# Patient Record
Sex: Female | Born: 1950 | Race: White | Hispanic: No
Health system: Midwestern US, Community
[De-identification: ages and names within clinical notes are randomized; demographics above are authoritative.]

## PROBLEM LIST (undated history)

## (undated) DIAGNOSIS — E785 Hyperlipidemia, unspecified: Secondary | ICD-10-CM

## (undated) DIAGNOSIS — I10 Essential (primary) hypertension: Secondary | ICD-10-CM

## (undated) DIAGNOSIS — J302 Other seasonal allergic rhinitis: Secondary | ICD-10-CM

## (undated) DIAGNOSIS — R7303 Prediabetes: Secondary | ICD-10-CM

## (undated) DIAGNOSIS — K219 Gastro-esophageal reflux disease without esophagitis: Secondary | ICD-10-CM

## (undated) DIAGNOSIS — N8111 Cystocele, midline: Secondary | ICD-10-CM

## (undated) DIAGNOSIS — Z419 Encounter for procedure for purposes other than remedying health state, unspecified: Secondary | ICD-10-CM

## (undated) DIAGNOSIS — N993 Prolapse of vaginal vault after hysterectomy: Secondary | ICD-10-CM

## (undated) DIAGNOSIS — K5520 Angiodysplasia of colon without hemorrhage: Secondary | ICD-10-CM

## (undated) DIAGNOSIS — E559 Vitamin D deficiency, unspecified: Secondary | ICD-10-CM

## (undated) DIAGNOSIS — Z79899 Other long term (current) drug therapy: Secondary | ICD-10-CM

## (undated) DIAGNOSIS — G5702 Lesion of sciatic nerve, left lower limb: Secondary | ICD-10-CM

## (undated) DIAGNOSIS — I7300 Raynaud's syndrome without gangrene: Secondary | ICD-10-CM

## (undated) DIAGNOSIS — K635 Polyp of colon: Secondary | ICD-10-CM

## (undated) DIAGNOSIS — Z952 Presence of prosthetic heart valve: Secondary | ICD-10-CM

## (undated) DIAGNOSIS — C439 Malignant melanoma of skin, unspecified: Secondary | ICD-10-CM

## (undated) HISTORY — PX: CARDIAC VALVE REPLACEMENT: SHX585

## (undated) HISTORY — DX: Raynaud's syndrome without gangrene: I73.00

## (undated) HISTORY — PX: BREAST EXCISIONAL BIOPSY: SUR124

## (undated) HISTORY — DX: Polyp of colon: K63.5

## (undated) HISTORY — PX: ABDOMINAL HYSTERECTOMY: SHX81

## (undated) HISTORY — DX: Malignant melanoma of skin, unspecified: C43.9

## (undated) HISTORY — DX: Presence of prosthetic heart valve: Z95.2

## (undated) HISTORY — PX: EYE SURGERY: SHX253

---

## 1997-06-22 ENCOUNTER — Ambulatory Visit (HOSPITAL_COMMUNITY): Admission: RE | Admit: 1997-06-22 | Discharge: 1997-06-22 | Payer: Self-pay | Admitting: Obstetrics and Gynecology

## 1998-06-19 ENCOUNTER — Ambulatory Visit (HOSPITAL_COMMUNITY): Admission: RE | Admit: 1998-06-19 | Discharge: 1998-06-19 | Payer: Self-pay | Admitting: Obstetrics and Gynecology

## 1999-04-12 ENCOUNTER — Ambulatory Visit (HOSPITAL_COMMUNITY): Admission: RE | Admit: 1999-04-12 | Discharge: 1999-04-12 | Payer: Self-pay | Admitting: Obstetrics and Gynecology

## 1999-04-12 ENCOUNTER — Encounter: Payer: Self-pay | Admitting: Obstetrics and Gynecology

## 1999-07-12 ENCOUNTER — Encounter: Admission: RE | Admit: 1999-07-12 | Discharge: 1999-07-12 | Payer: Self-pay | Admitting: Obstetrics and Gynecology

## 1999-07-12 ENCOUNTER — Encounter: Payer: Self-pay | Admitting: Obstetrics and Gynecology

## 2000-01-01 ENCOUNTER — Ambulatory Visit (HOSPITAL_BASED_OUTPATIENT_CLINIC_OR_DEPARTMENT_OTHER): Admission: RE | Admit: 2000-01-01 | Discharge: 2000-01-01 | Payer: Self-pay | Admitting: Plastic Surgery

## 2000-01-01 ENCOUNTER — Encounter (INDEPENDENT_AMBULATORY_CARE_PROVIDER_SITE_OTHER): Payer: Self-pay | Admitting: Specialist

## 2000-05-07 ENCOUNTER — Emergency Department (HOSPITAL_COMMUNITY): Admission: EM | Admit: 2000-05-07 | Discharge: 2000-05-08 | Payer: Self-pay | Admitting: Emergency Medicine

## 2000-05-07 ENCOUNTER — Encounter: Payer: Self-pay | Admitting: Emergency Medicine

## 2000-05-08 ENCOUNTER — Encounter: Payer: Self-pay | Admitting: Emergency Medicine

## 2000-07-14 ENCOUNTER — Ambulatory Visit (HOSPITAL_COMMUNITY): Admission: RE | Admit: 2000-07-14 | Discharge: 2000-07-14 | Payer: Self-pay | Admitting: Obstetrics and Gynecology

## 2000-07-14 ENCOUNTER — Encounter: Payer: Self-pay | Admitting: Obstetrics and Gynecology

## 2000-07-17 ENCOUNTER — Ambulatory Visit (HOSPITAL_BASED_OUTPATIENT_CLINIC_OR_DEPARTMENT_OTHER): Admission: RE | Admit: 2000-07-17 | Discharge: 2000-07-17 | Payer: Self-pay | Admitting: Plastic Surgery

## 2000-07-17 ENCOUNTER — Encounter (INDEPENDENT_AMBULATORY_CARE_PROVIDER_SITE_OTHER): Payer: Self-pay | Admitting: *Deleted

## 2001-07-16 ENCOUNTER — Ambulatory Visit (HOSPITAL_COMMUNITY): Admission: RE | Admit: 2001-07-16 | Discharge: 2001-07-16 | Payer: Self-pay | Admitting: Obstetrics and Gynecology

## 2001-07-16 ENCOUNTER — Encounter: Payer: Self-pay | Admitting: Obstetrics and Gynecology

## 2002-07-21 ENCOUNTER — Encounter: Payer: Self-pay | Admitting: Obstetrics and Gynecology

## 2002-07-21 ENCOUNTER — Ambulatory Visit (HOSPITAL_COMMUNITY): Admission: RE | Admit: 2002-07-21 | Discharge: 2002-07-21 | Payer: Self-pay | Admitting: Obstetrics and Gynecology

## 2002-12-31 ENCOUNTER — Encounter: Admission: RE | Admit: 2002-12-31 | Discharge: 2002-12-31 | Payer: Self-pay | Admitting: Obstetrics and Gynecology

## 2003-07-29 ENCOUNTER — Ambulatory Visit (HOSPITAL_COMMUNITY): Admission: RE | Admit: 2003-07-29 | Discharge: 2003-07-29 | Payer: Self-pay | Admitting: Obstetrics and Gynecology

## 2004-08-03 ENCOUNTER — Ambulatory Visit (HOSPITAL_COMMUNITY): Admission: RE | Admit: 2004-08-03 | Discharge: 2004-08-03 | Payer: Self-pay | Admitting: Obstetrics and Gynecology

## 2005-08-06 ENCOUNTER — Ambulatory Visit (HOSPITAL_COMMUNITY): Admission: RE | Admit: 2005-08-06 | Discharge: 2005-08-06 | Payer: Self-pay | Admitting: Obstetrics and Gynecology

## 2006-01-28 HISTORY — PX: BILATERAL OOPHORECTOMY: SHX1221

## 2006-08-08 ENCOUNTER — Ambulatory Visit (HOSPITAL_COMMUNITY): Admission: RE | Admit: 2006-08-08 | Discharge: 2006-08-08 | Payer: Self-pay | Admitting: Obstetrics and Gynecology

## 2006-08-11 ENCOUNTER — Encounter: Admission: RE | Admit: 2006-08-11 | Discharge: 2006-08-11 | Payer: Self-pay | Admitting: Obstetrics and Gynecology

## 2006-08-18 ENCOUNTER — Ambulatory Visit (HOSPITAL_COMMUNITY): Admission: RE | Admit: 2006-08-18 | Discharge: 2006-08-18 | Payer: Self-pay | Admitting: Obstetrics and Gynecology

## 2007-01-29 DIAGNOSIS — K635 Polyp of colon: Secondary | ICD-10-CM

## 2007-01-29 HISTORY — DX: Polyp of colon: K63.5

## 2007-08-11 ENCOUNTER — Ambulatory Visit (HOSPITAL_COMMUNITY): Admission: RE | Admit: 2007-08-11 | Discharge: 2007-08-11 | Payer: Self-pay | Admitting: Obstetrics and Gynecology

## 2008-08-16 ENCOUNTER — Ambulatory Visit (HOSPITAL_COMMUNITY): Admission: RE | Admit: 2008-08-16 | Discharge: 2008-08-16 | Payer: Self-pay | Admitting: Obstetrics and Gynecology

## 2009-06-02 ENCOUNTER — Inpatient Hospital Stay (HOSPITAL_BASED_OUTPATIENT_CLINIC_OR_DEPARTMENT_OTHER): Admission: RE | Admit: 2009-06-02 | Discharge: 2009-06-02 | Payer: Self-pay | Admitting: Interventional Cardiology

## 2009-06-06 ENCOUNTER — Ambulatory Visit: Payer: Self-pay | Admitting: Surgery

## 2009-06-13 ENCOUNTER — Encounter: Admission: RE | Admit: 2009-06-13 | Discharge: 2009-06-13 | Payer: Self-pay | Admitting: Surgery

## 2009-06-13 ENCOUNTER — Ambulatory Visit: Payer: Self-pay | Admitting: Surgery

## 2009-07-03 ENCOUNTER — Encounter: Payer: Self-pay | Admitting: Surgery

## 2009-07-05 ENCOUNTER — Encounter: Payer: Self-pay | Admitting: Surgery

## 2009-07-05 ENCOUNTER — Ambulatory Visit: Payer: Self-pay | Admitting: Surgery

## 2009-07-05 ENCOUNTER — Inpatient Hospital Stay (HOSPITAL_COMMUNITY): Admission: RE | Admit: 2009-07-05 | Discharge: 2009-07-11 | Payer: Self-pay | Admitting: Surgery

## 2009-07-25 ENCOUNTER — Encounter: Admission: RE | Admit: 2009-07-25 | Discharge: 2009-07-25 | Payer: Self-pay | Admitting: Surgery

## 2009-07-25 ENCOUNTER — Ambulatory Visit: Payer: Self-pay | Admitting: Surgery

## 2009-08-03 ENCOUNTER — Encounter (HOSPITAL_COMMUNITY): Admission: RE | Admit: 2009-08-03 | Discharge: 2009-10-27 | Payer: Self-pay | Admitting: Interventional Cardiology

## 2009-10-28 ENCOUNTER — Encounter (HOSPITAL_COMMUNITY)
Admission: RE | Admit: 2009-10-28 | Discharge: 2009-12-27 | Payer: Self-pay | Source: Home / Self Care | Admitting: Interventional Cardiology

## 2010-01-18 ENCOUNTER — Ambulatory Visit (HOSPITAL_COMMUNITY)
Admission: RE | Admit: 2010-01-18 | Discharge: 2010-01-18 | Payer: Self-pay | Source: Home / Self Care | Attending: Obstetrics and Gynecology | Admitting: Obstetrics and Gynecology

## 2010-04-16 LAB — TYPE AND SCREEN
ABO/RH(D): O POS
Antibody Screen: NEGATIVE

## 2010-04-16 LAB — PROTIME-INR
INR: 1.43 (ref 0.00–1.49)
Prothrombin Time: 17.3 seconds — ABNORMAL HIGH (ref 11.6–15.2)
Prothrombin Time: 20.4 seconds — ABNORMAL HIGH (ref 11.6–15.2)

## 2010-04-16 LAB — CBC
HCT: 23.6 % — ABNORMAL LOW (ref 36.0–46.0)
HCT: 27.1 % — ABNORMAL LOW (ref 36.0–46.0)
HCT: 28.4 % — ABNORMAL LOW (ref 36.0–46.0)
Hemoglobin: 15.2 g/dL — ABNORMAL HIGH (ref 12.0–15.0)
Hemoglobin: 9.4 g/dL — ABNORMAL LOW (ref 12.0–15.0)
MCHC: 34.4 g/dL (ref 30.0–36.0)
MCHC: 34.4 g/dL (ref 30.0–36.0)
MCHC: 35 g/dL (ref 30.0–36.0)
MCV: 96.6 fL (ref 78.0–100.0)
MCV: 97.8 fL (ref 78.0–100.0)
MCV: 97.8 fL (ref 78.0–100.0)
MCV: 98.3 fL (ref 78.0–100.0)
MCV: 98.3 fL (ref 78.0–100.0)
Platelets: 103 10*3/uL — ABNORMAL LOW (ref 150–400)
Platelets: 110 10*3/uL — ABNORMAL LOW (ref 150–400)
Platelets: 116 10*3/uL — ABNORMAL LOW (ref 150–400)
Platelets: 128 10*3/uL — ABNORMAL LOW (ref 150–400)
Platelets: 129 10*3/uL — ABNORMAL LOW (ref 150–400)
Platelets: 136 10*3/uL — ABNORMAL LOW (ref 150–400)
RBC: 2.36 MIL/uL — ABNORMAL LOW (ref 3.87–5.11)
RBC: 2.7 MIL/uL — ABNORMAL LOW (ref 3.87–5.11)
RBC: 2.77 MIL/uL — ABNORMAL LOW (ref 3.87–5.11)
RBC: 4.5 MIL/uL (ref 3.87–5.11)
RDW: 12.7 % (ref 11.5–15.5)
RDW: 12.9 % (ref 11.5–15.5)
RDW: 13.1 % (ref 11.5–15.5)
WBC: 10.4 10*3/uL (ref 4.0–10.5)
WBC: 11.2 10*3/uL — ABNORMAL HIGH (ref 4.0–10.5)
WBC: 5.6 10*3/uL (ref 4.0–10.5)
WBC: 5.9 10*3/uL (ref 4.0–10.5)
WBC: 8.4 10*3/uL (ref 4.0–10.5)

## 2010-04-16 LAB — PREPARE FRESH FROZEN PLASMA

## 2010-04-16 LAB — POCT I-STAT 3, ART BLOOD GAS (G3+)
Bicarbonate: 26.5 mEq/L — ABNORMAL HIGH (ref 20.0–24.0)
O2 Saturation: 100 %
Patient temperature: 36.9
TCO2: 27 mmol/L (ref 0–100)
pCO2 arterial: 36.2 mmHg (ref 35.0–45.0)
pCO2 arterial: 39 mmHg (ref 35.0–45.0)
pH, Arterial: 7.43 — ABNORMAL HIGH (ref 7.350–7.400)
pH, Arterial: 7.456 — ABNORMAL HIGH (ref 7.350–7.400)
pO2, Arterial: 158 mmHg — ABNORMAL HIGH (ref 80.0–100.0)
pO2, Arterial: 175 mmHg — ABNORMAL HIGH (ref 80.0–100.0)

## 2010-04-16 LAB — CREATININE, SERUM
Creatinine, Ser: 0.54 mg/dL (ref 0.4–1.2)
GFR calc Af Amer: >60 mL/min (ref >60)
GFR calc non Af Amer: >60 mL/min (ref >60)

## 2010-04-16 LAB — BASIC METABOLIC PANEL
BUN: 14 mg/dL (ref 6–23)
BUN: 15 mg/dL (ref 6–23)
BUN: 18 mg/dL (ref 6–23)
BUN: 8 mg/dL (ref 6–23)
CO2: 24 mEq/L (ref 19–32)
CO2: 24 mEq/L (ref 19–32)
CO2: 27 mEq/L (ref 19–32)
CO2: 28 mEq/L (ref 19–32)
Calcium: 8.8 mg/dL (ref 8.4–10.5)
Chloride: 105 mEq/L (ref 96–112)
Chloride: 105 mEq/L (ref 96–112)
Chloride: 106 mEq/L (ref 96–112)
Chloride: 108 mEq/L (ref 96–112)
Chloride: 112 mEq/L (ref 96–112)
Creatinine, Ser: 0.56 mg/dL (ref 0.4–1.2)
Creatinine, Ser: 0.62 mg/dL (ref 0.4–1.2)
Creatinine, Ser: 0.7 mg/dL (ref 0.4–1.2)
Creatinine, Ser: 0.73 mg/dL (ref 0.4–1.2)
GFR calc Af Amer: >60 mL/min (ref >60)
Glucose, Bld: 105 mg/dL — ABNORMAL HIGH (ref 70–99)
Glucose, Bld: 105 mg/dL — ABNORMAL HIGH (ref 70–99)
Glucose, Bld: 125 mg/dL — ABNORMAL HIGH (ref 70–99)
Potassium: 5.5 mEq/L — ABNORMAL HIGH (ref 3.5–5.1)
Sodium: 135 mEq/L (ref 135–145)
Sodium: 137 mEq/L (ref 135–145)

## 2010-04-16 LAB — POCT I-STAT 4, (NA,K, GLUC, HGB,HCT)
Glucose, Bld: 109 mg/dL — ABNORMAL HIGH (ref 70–99)
Glucose, Bld: 98 mg/dL (ref 70–99)
HCT: 25 % — ABNORMAL LOW (ref 36.0–46.0)
HCT: 25 % — ABNORMAL LOW (ref 36.0–46.0)
HCT: 26 % — ABNORMAL LOW (ref 36.0–46.0)
HCT: 36 % (ref 36.0–46.0)
Hemoglobin: 8.5 g/dL — ABNORMAL LOW (ref 12.0–15.0)
Hemoglobin: 9.5 g/dL — ABNORMAL LOW (ref 12.0–15.0)
Potassium: 3.2 mEq/L — ABNORMAL LOW (ref 3.5–5.1)
Potassium: 4.5 mEq/L (ref 3.5–5.1)
Sodium: 135 mEq/L (ref 135–145)
Sodium: 136 mEq/L (ref 135–145)
Sodium: 138 mEq/L (ref 135–145)

## 2010-04-16 LAB — GLUCOSE, CAPILLARY
Glucose-Capillary: 126 mg/dL — ABNORMAL HIGH (ref 70–99)
Glucose-Capillary: 141 mg/dL — ABNORMAL HIGH (ref 70–99)
Glucose-Capillary: 96 mg/dL (ref 70–99)

## 2010-04-16 LAB — SURGICAL PCR SCREEN
MRSA, PCR: NEGATIVE
Staphylococcus aureus: NEGATIVE

## 2010-04-16 LAB — HEMOGLOBIN AND HEMATOCRIT, BLOOD
HCT: 28.9 % — ABNORMAL LOW (ref 36.0–46.0)
Hemoglobin: 9.9 g/dL — ABNORMAL LOW (ref 12.0–15.0)

## 2010-04-16 LAB — PREPARE PLATELETS

## 2010-04-16 LAB — URINALYSIS, ROUTINE W REFLEX MICROSCOPIC
Bilirubin Urine: NEGATIVE
Glucose, UA: NEGATIVE mg/dL
Hgb urine dipstick: NEGATIVE
Specific Gravity, Urine: 1.018 (ref 1.005–1.030)
Urobilinogen, UA: 1 mg/dL (ref 0.0–1.0)
pH: 6.5 (ref 5.0–8.0)

## 2010-04-16 LAB — POCT I-STAT, CHEM 8
Calcium, Ion: 1.24 mmol/L (ref 1.12–1.32)
Chloride: 112 mEq/L (ref 96–112)
HCT: 25 % — ABNORMAL LOW (ref 36.0–46.0)
TCO2: 24 mmol/L (ref 0–100)

## 2010-04-16 LAB — APTT
aPTT: 29 seconds (ref 24–37)
aPTT: 39 seconds — ABNORMAL HIGH (ref 24–37)

## 2010-04-16 LAB — MAGNESIUM
Magnesium: 2.2 mg/dL (ref 1.5–2.5)
Magnesium: 2.5 mg/dL (ref 1.5–2.5)
Magnesium: 3.1 mg/dL — ABNORMAL HIGH (ref 1.5–2.5)

## 2010-04-16 LAB — PREPARE CRYOPRECIPITATE

## 2010-04-16 LAB — ABO/RH: ABO/RH(D): O POS

## 2010-04-16 LAB — BLOOD GAS, ARTERIAL
Acid-Base Excess: 1.8 mmol/L (ref 0.0–2.0)
Drawn by: 181601
O2 Saturation: 96.9 %
TCO2: 26.6 mmol/L (ref 0–100)
pCO2 arterial: 37.2 mmHg (ref 35.0–45.0)

## 2010-04-16 LAB — HEPATIC FUNCTION PANEL
ALT: 22 U/L (ref 0–35)
AST: 23 U/L (ref 0–37)
Alkaline Phosphatase: 66 U/L (ref 39–117)
Total Protein: 6.7 g/dL (ref 6.0–8.3)

## 2010-04-17 LAB — POCT I-STAT 3, ART BLOOD GAS (G3+)
Bicarbonate: 26.3 mEq/L — ABNORMAL HIGH (ref 20.0–24.0)
TCO2: 28 mmol/L (ref 0–100)
pCO2 arterial: 42.9 mmHg (ref 35.0–45.0)
pH, Arterial: 7.395 (ref 7.350–7.400)

## 2010-04-17 LAB — POCT I-STAT 3, VENOUS BLOOD GAS (G3P V)
O2 Saturation: 72 %
pCO2, Ven: 45.2 mmHg (ref 45.0–50.0)
pH, Ven: 7.367 — ABNORMAL HIGH (ref 7.250–7.300)

## 2010-06-12 NOTE — Assessment & Plan Note (Signed)
OFFICE VISIT   Tiffany, [name]  DOB:  1950/03/22                                        Jun 14, 2009  CHART #:  [id number]   The patient returned today for follow up of her CT angiogram of the  chest and further discussion of planned aortic valve replacement.  She  has no complaints at this time.  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  The maximum diameter at the level  of the right main pulmonary artery is measured at 38 mm.  This in  comparison to her normal diameter of the descending aorta of about 25  mm.  I reviewed the CT scan findings with her.  I think that given the  fact that she has a bicuspid aortic valve by 2-D echocardiogram that the  ascending aorta is most likely structurally abnormal and weaker than  usual.  I think we should replace her ascending aorta time at time of  aortic valve replacement.  The diameter of the aortic root looks fairly  normal and we may be able to replace from the supracoronary level to the  takeoff of the innominate artery.  I discussed the options of  replacement of the aorta versus leaving her aorta alone with her.  I  discussed the increased risk of further enlargement and the risk of  aortic dissection.  I discussed the fact that patients with bicuspid  valves who do not have their ascending aorta replaced tend to have  higher complication rates with these 2 problems.  She is in agreement  with replacing her ascending aorta if I feel it is indicated.  We also  discussed the valve replacement choice including mechanical and tissue  valves again.  She is only 60 years old but has given a lot of thought  and is quite sure that she does not want a mechanical valve and  Coumadin.  She understands that there is significant risk of structural  valve deterioration requiring further intervention in her lifetime but  she fully accepts and would still rather have a tissue valve.  I would  plan to honor her wishes and use a tissue valve since she would be most  comfortable with that.  I discussed surgery again with her including  benefits and risks including but not limited to bleeding, blood  transfusion, infection, stroke, myocardial infarction, heart block  requiring permanent pacemaker, structural valve deterioration in the  future, and death.  She  understands all this and would like to proceed with surgery.  She will  call my office to set this up in June.   Evelene Croon, M.D.  Electronically Signed   BB/MEDQ  D:  06/14/2009  T:  06/15/2009  Job:  [id number]   cc:   Lyn Records, M.D.  Theressa Millard, M.D.

## 2010-06-12 NOTE — Assessment & Plan Note (Signed)
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  She said that she feels great and is walking  daily without chest pain or shortness of breath.  Her only complaint is  that she has not been sleeping very well.   PHYSICAL EXAMINATION:  Blood pressure 150/80, pulse 52 and regular, and  respiratory rate is 18, unlabored.  Oxygen saturation on room air is  97%.  She looks well.  Cardiac exam shows regular rate and rhythm with  normal valve sounds.  There is no murmur.  Her lungs are clear.  The  chest incision is healing well and the sternum is stable.  There is no  peripheral edema.   MEDICATIONS:  1. Aspirin 325 mg daily.  2. Fish oil 1000 mg daily.  3. Vivelle-Dot patch 0.05 mg daily.  4. Fluticasone nasal spray p.r.n.  5. Toprol-XL 12.5 mg b.i.d.  6. Amiodarone 200 mg b.i.d.  7. Ferrous sulfate 325 mg daily.  8. Lasix 40 mg daily.  9. Oxycodone 5 mg p.r.n.  10.Potassium 20 mEq daily.  11.A stool softener.   DIAGNOSTIC TESTS:  Followup chest x-ray shows clear lung fields and no  pleural effusions.   IMPRESSION:  The patient is making a good recovery following her  surgery.  She did have postoperative atrial fibrillation with a rapid  ventricular response, but has had no tachy palpitations since discharge  on amiodarone.  I would hope that she can get off amiodarone in a few  weeks, but that decision will be up to Dr. Katrinka Blazing.  I told her she can  return to driving a car, but should refrain from lifting anything  heavier than 10 pounds for a total of 3 months from date of surgery.  She will continue to follow up with Dr. Verdis Prime and  will contact me  if she develops any problems with her incision.   Evelene Croon, M.D.  Electronically Signed   BB/MEDQ  D:  07/25/2009  T:  07/26/2009  Job:  [id number]   cc:   Lyn Records, M.D.

## 2010-06-12 NOTE — Consult Note (Signed)
NEW PATIENT CONSULTATION   [name], [name]  DOB:  [birth year]                                        Jun 06, 2009  CHART #:  [id number]   REASON FOR CONSULTATION:  Severe aortic stenosis.   CLINICAL HISTORY:  I was asked by Dr. Katrinka Blazing to evaluate the patient for  consideration of aortic valve replacement.  She is a 60 year old woman  with a history of hypertension who has been followed for many years by  Dr. Earl Gala.  She reports having 2 episodes about a year ago where she  had dizziness and near syncope on one occasion and then frank syncope on  another occasion.  She said that no cause for this was ever found.  She  then presented at the end of March 2011 with symptoms of weakness and  fatigue as well as tachy palpitations.  She said she was seen by Dr.  Johnella Moloney since Dr. Earl Gala was not available.   DIAGNOSTIC TESTS:  She had a echocardiogram from April 06, 2009 which  showed severe aortic valve stenosis.  The aortic valve appeared  bicuspid.  There was no aortic insufficiency.  Left ventricular size and  function were normal.  Ejection fraction was estimated at 60-65%.  There  is mild elevation of right ventricular systolic pressure.  Right  ventricular function and size were normal.  There was trace mitral  regurgitation.  She was noted to have a mean aortic valve gradient of 27  mmHg and a peak gradient of 50 mmHg with a peak velocity of 3.53 m/s  across the aortic valve.  The aortic valve area was noted as 0.72 sq. cm  by velocity and 0.87 sq. cm by VTI.  She denied any history of previous  aortic valve disease.  She said she was started on a beta-blocker, but  developed bradycardia and felt even worse with marked fatigue.  Since  then, beta-blocker dose has been decreased and she has felt much better.  She underwent a nuclear stress test on April 10, 2009, which showed no  ischemia.  Ejection fraction was 70%.  There was some exercise-induced  ventricular ectopy.  She subsequently underwent a cardiac  catheterization on Jun 02, 2009, which showed normal right heart  pressures.  The mean gradient across her aortic valve was 25 mmHg.  Cardiac output was 3.0 by thermodilution of 4.8 by Fick.  Aortic valve  area was measured at 0.8 sq. cm by thermodilution and 1.14 sq. cm by  Fick.  Ejection fraction was 70%.  Coronary angiography was normal.   REVIEW OF SYSTEMS:  Her review of systems is as follows.  GENERAL:  She denies any fever or chills.  She has had no recent weight  changes.  She does report some fatigue, although she said she feels  better now that her beta-blocker dose has been decreased.  EYES:  Negative.  ENT:  Negative.  ENDOCRINE:  She denies diabetes and hypothyroidism.  CARDIOVASCULAR:  She does occasionally have some chest tightness, which  she feels was heartburn.  She denies any PND or orthopnea.  She does  have exertional dyspnea at times.  She has had tachy palpitations.  RESPIRATORY:  She denies cough and sputum production.  GI:  She has had no nausea or vomiting.  She denies melena and bright  red blood per rectum.  GU:  She denies dysuria and hematuria.  MUSCULOSKELETAL:  She denies arthralgias and myalgias.  NEUROLOGICAL:  She denies any focal weakness and numbness.  She had 2  episodes of dizziness and these were associated with syncope and  presyncope about 1 year ago.  She denies any history of TIA or stroke.  HEMATOLOGICAL:  Negative.  PSYCHIATRIC:  Negative.   ALLERGIES:  To sulfa.   PAST MEDICAL HISTORY:  Significant for hypertension.  She has a history  of aortic stenosis diagnosed over the past couple of months.  She has  history of Raynaud phenomenon involving her feet.   PAST SURGICAL HISTORY:  She has had 3 melanomas taken off previously by  Dr. Yetta Barre.  She has a history of hyperplastic left colon polyp in 2009  removed.  She is status post hammertoe surgery in 2011.  She is status  post  hysterectomy, appendectomy, ovariectomy for ovarian torsion, and  tonsillectomy.   FAMILY HISTORY:  Her father died at 60 with esophageal cancer.  Mother  died at 91 with breast cancer and had myocardial infarction.  She has a  brother who is alive at 16 years old.  There is no family history of  aortic valve disease or aneurysm disease.   SOCIAL HISTORY:  She works in [employer], [employer].  She is a  previous smoker, but quit 2 years ago.  She reports occasional alcohol  use.  She is widowed and has no children.   MEDICATIONS:  1. Citracal plus [name].  2. Aspirin 81 mg daily.  3. Vitamin C 500 mg daily.  4. Fish oil 1000 mg daily.  5. Vivelle-Dot 0.05 mg per day.  6. Fluticasone 50 mcg daily.  7. HCTZ 25 mg daily.  8. Toprol-XL 12.5 mg daily.  9. Nitroglycerin 0.4 mg p.r.n.   PHYSICAL EXAMINATION:  Vital Signs:  Her blood pressure is 148/90, pulse  is 58 and regular, and respiratory rate is 18, unlabored.  Oxygen  saturation on room air is 98%.  General:  She is a well-developed white  female, in no distress.  HEENT:  Normocephalic and atraumatic.  Pupils  are equal and reactive to light and accommodation.  Extraocular muscles  are intact.  Throat is clear.  Teeth are in good condition.  Neck:  Normal carotid pulses bilaterally.  There is a transmitted murmur at  both sides of the neck.  There is no adenopathy or thyromegaly.  Cardiac:  Regular rate and rhythm with a normal S1 and S2.  There is a  grade 3/6 systolic murmur over the aorta.  There is no diastolic murmur.  Lungs:  Clear.  Abdomen:  Active bowel sounds.  Abdomen is soft and  nontender.  There are no palpable masses or organomegaly.  Extremities:  No peripheral edema.  Pedal pulses are palpable bilaterally.  Skin:  Warm and dry.  There is slight purplish discoloration of both feet.  Neurologic:  Alert and oriented x3.  Motor and sensory exams are grossly  normal.   IMPRESSION:  The patient has severe aortic  stenosis by echocardiogram  and catheterization data.  She has symptoms over the past year that  could be attributed to her aortic valve stenosis.  She has fairly normal  cardiac hemodynamics and left ventricular function.  I think it would be  best to proceed with aortic valve replacement since this will continue  to progress and result in worsening left ventricular function and  symptoms and there is always the risk of sudden death with severe aortic  stenosis.  She may have a bicuspid valve based on echocardiogram and  therefore we will do a CT angiogram of the chest to evaluate her aorta  for associated aneurysmal disease.  I discussed the aortic valve  replacement with her and her sister.  She would like to proceed with the  aortic valve replacement.  I discussed the pros and cons of mechanical  and tissue valves and my recommendation for mechanical valve given her  age of 75 years with normal coronary arteries and no contraindication to  anticoagulation.  I discussed the benefits and risks of surgery  including, but not limited to bleeding, blood transfusion, infection,  stroke, myocardial infarction, heart block requiring permanent  pacemaker, and death.  She understands all of this and would like to  proceed with surgery.  We will plan to do a CT angiogram of the chest to  evaluate her aorta later this week, and I will see her back in the  office next week to review the results with her and answer any further  questions.  She would like to get the surgery scheduled in next few  weeks if possible.  Also, I discussed the possibility of other family  members having bicuspid valve disease with or without aneurysm or aortic  aneurysm disease without bicuspid valve with the patient and her sister.  I recommended screening for all family  members with echocardiogram.  They understand this and have already been  asked some questions concerning this after researching bicuspid valves  on  the internet.   Evelene Croon, M.[name].  Electronically Signed   BB/MEDQ  [name]:  06/06/2009  T:  06/07/2009  Job:  [id number]   cc:   Candyce Churn, M.[name].  Lyn Records, M.[name].

## 2010-12-18 ENCOUNTER — Other Ambulatory Visit (HOSPITAL_COMMUNITY): Payer: Self-pay | Admitting: Obstetrics and Gynecology

## 2010-12-18 DIAGNOSIS — Z1231 Encounter for screening mammogram for malignant neoplasm of breast: Secondary | ICD-10-CM

## 2011-01-23 ENCOUNTER — Ambulatory Visit (HOSPITAL_COMMUNITY)
Admission: RE | Admit: 2011-01-23 | Discharge: 2011-01-23 | Disposition: A | Discharge status: Home / Self Care | Payer: BC Managed Care – PPO | Source: Ambulatory Visit | Attending: Obstetrics and Gynecology | Admitting: Obstetrics and Gynecology

## 2011-01-23 DIAGNOSIS — Z1231 Encounter for screening mammogram for malignant neoplasm of breast: Secondary | ICD-10-CM

## 2011-11-25 ENCOUNTER — Other Ambulatory Visit: Payer: Self-pay | Admitting: Dermatology

## 2011-12-09 ENCOUNTER — Other Ambulatory Visit: Payer: Self-pay | Admitting: Dermatology

## 2011-12-24 ENCOUNTER — Other Ambulatory Visit (HOSPITAL_COMMUNITY): Payer: Self-pay | Admitting: Obstetrics and Gynecology

## 2011-12-24 DIAGNOSIS — Z1231 Encounter for screening mammogram for malignant neoplasm of breast: Secondary | ICD-10-CM

## 2012-01-24 ENCOUNTER — Ambulatory Visit (HOSPITAL_COMMUNITY)
Admission: RE | Admit: 2012-01-24 | Discharge: 2012-01-24 | Disposition: A | Discharge status: Home / Self Care | Payer: BC Managed Care – PPO | Source: Ambulatory Visit | Attending: Obstetrics and Gynecology | Admitting: Obstetrics and Gynecology

## 2012-01-24 DIAGNOSIS — Z1231 Encounter for screening mammogram for malignant neoplasm of breast: Secondary | ICD-10-CM | POA: Insufficient documentation

## 2012-05-08 ENCOUNTER — Emergency Department (HOSPITAL_COMMUNITY)
Admission: EM | Admit: 2012-05-08 | Discharge: 2012-05-08 | Disposition: A | Discharge status: Home / Self Care | Payer: BC Managed Care – PPO | Attending: Emergency Medicine | Admitting: Emergency Medicine

## 2012-05-08 ENCOUNTER — Encounter (HOSPITAL_COMMUNITY): Payer: Self-pay | Admitting: Cardiology

## 2012-05-08 DIAGNOSIS — Y9389 Activity, other specified: Secondary | ICD-10-CM | POA: Insufficient documentation

## 2012-05-08 DIAGNOSIS — I10 Essential (primary) hypertension: Secondary | ICD-10-CM | POA: Insufficient documentation

## 2012-05-08 DIAGNOSIS — S81009A Unspecified open wound, unspecified knee, initial encounter: Secondary | ICD-10-CM | POA: Insufficient documentation

## 2012-05-08 DIAGNOSIS — Y929 Unspecified place or not applicable: Secondary | ICD-10-CM | POA: Insufficient documentation

## 2012-05-08 DIAGNOSIS — Z79899 Other long term (current) drug therapy: Secondary | ICD-10-CM | POA: Insufficient documentation

## 2012-05-08 DIAGNOSIS — S81811A Laceration without foreign body, right lower leg, initial encounter: Secondary | ICD-10-CM

## 2012-05-08 DIAGNOSIS — W2203XA Walked into furniture, initial encounter: Secondary | ICD-10-CM | POA: Insufficient documentation

## 2012-05-08 DIAGNOSIS — S91009A Unspecified open wound, unspecified ankle, initial encounter: Secondary | ICD-10-CM | POA: Insufficient documentation

## 2012-05-08 DIAGNOSIS — Z7982 Long term (current) use of aspirin: Secondary | ICD-10-CM | POA: Insufficient documentation

## 2012-05-08 DIAGNOSIS — Z23 Encounter for immunization: Secondary | ICD-10-CM | POA: Insufficient documentation

## 2012-05-08 HISTORY — DX: Essential (primary) hypertension: I10

## 2012-05-08 MED ORDER — TETANUS-DIPHTH-ACELL PERTUSSIS 5-2.5-18.5 LF-MCG/0.5 IM SUSP
0.5000 mL | Freq: Once | INTRAMUSCULAR | Status: AC
  Administered 2012-05-08: 0.5 mL via INTRAMUSCULAR
  Filled 2012-05-08: qty 0.5

## 2012-05-08 NOTE — ED Notes (Signed)
PA at bedside suturing.

## 2012-05-08 NOTE — ED Notes (Signed)
Pt reports she was cleaning deck furniture and tripped. States she cut her right shin. Bleeding controlled. States she takes ASA daily.

## 2012-05-08 NOTE — ED Provider Notes (Signed)
History     CSN: [id number]  Arrival date & time 05/08/12  1013   First MD Initiated Contact with Patient 05/08/12 1040      Chief Complaint  Patient presents with  . Extremity Laceration    (Consider location/radiation/quality/duration/timing/severity/associated sxs/prior treatment) HPI Comments: Patient presents to the ED today with a laceration to the right anterior shin.  She reports that she was on the deck cleaning furniture and tripped.  Her leg then hit the edge of a wooden piece of furniture.  Her skin is very thin.  She is currently on 81 mg Aspirin daily.  She has been applying pressure to the area, which his controlling the bleeding.  She is ambulatory and has full ROM of both legs.  She denies numbness or tingling.  She is unsure of the date of her last tetanus.    The history is provided by the patient.    Past Medical History  Diagnosis Date  . Hypertension     History reviewed. No pertinent past surgical history.  History reviewed. No pertinent family history.  History  Substance Use Topics  . Smoking status: Not on file  . Smokeless tobacco: Not on file  . Alcohol Use: Not on file    OB History   Grav Para Term Preterm Abortions TAB SAB Ect Mult Living                  Review of Systems  Skin: Positive for wound.  All other systems reviewed and are negative.    Allergies  Sulfa antibiotics  Home Medications   Current Outpatient Rx  Name  Route  Sig  Dispense  Refill  . Aliskiren-Hydrochlorothiazide (TEKTURNA HCT) 150-25 MG TABS   Oral   Take 1 tablet by mouth daily.         Marland Kitchen aspirin EC 81 MG tablet   Oral   Take 81 mg by mouth daily.         . cholecalciferol (VITAMIN D) 1000 UNITS tablet   Oral   Take 1,000 Units by mouth daily.         . fluticasone (FLONASE) 50 MCG/ACT nasal spray   Nasal   Place 2 sprays into the nose daily.         . metoprolol succinate (TOPROL-XL) 25 MG 24 hr tablet   Oral   Take 25 mg by mouth  daily.           BP 163/82  Pulse 60  Temp(Src) 98 F (36.7 C) (Oral)  Resp 18  SpO2 98%  Physical Exam  Nursing note and vitals reviewed. Constitutional: She appears well-developed and well-nourished. No distress.  HENT:  Head: Normocephalic and atraumatic.  Neck: Normal range of motion. Neck supple.  Cardiovascular: Normal rate, regular rhythm and normal heart sounds.   Pulses:      Dorsalis pedis pulses are 2+ on the right side, and 2+ on the left side.  Pulmonary/Chest: Effort normal and breath sounds normal.  Musculoskeletal: Normal range of motion.       Right knee: She exhibits normal range of motion and no swelling. No tenderness found.       Right ankle: She exhibits normal range of motion and no swelling. No tenderness.       Cervical back: She exhibits normal range of motion, no tenderness, no bony tenderness, no swelling, no edema and no deformity.       Thoracic back: She exhibits normal range of motion,  no tenderness, no bony tenderness, no swelling, no edema and no deformity.       Lumbar back: She exhibits normal range of motion, no tenderness, no bony tenderness, no swelling, no edema and no deformity.  Neurological: She is alert. She has normal strength. No sensory deficit. Gait normal.  Full sensation of the right foot  Skin: Skin is warm and dry. She is not diaphoretic.     1 cm skin tear just proximal to the laceration of the right lower leg  Psychiatric: She has a normal mood and affect.    ED Course  Procedures (including critical care time)  Labs Reviewed - No data to display No results found.   No diagnosis found.  LACERATION REPAIR Performed by: Anne Shutter, Brynlea Spindler Authorized by: Anne Shutter, Herbert Seta Consent: Verbal consent obtained. Risks and benefits: risks, benefits and alternatives were discussed Consent given by: patient Patient identity confirmed: provided demographic data Prepped and Draped in normal sterile fashion Wound  explored  Laceration Location: right shin  Laceration Length: 10 cm  No Foreign Bodies seen or palpated  Anesthesia: local infiltration  Local anesthetic: lidocaine 2% with epinephrine  Anesthetic total: 8 ml  Irrigation method: syringe Amount of cleaning: standard  Skin closure: 3-0 Prolene  Number of sutures: 19  Technique: Simple interrupted.  Patient tolerance: Patient tolerated the procedure well with no immediate complications.   Patient offered xray of the tib/fib, but declined. MDM  Patient presenting with a laceration to the anterior right lower leg.  Patient neurovascularly intact.  Full ROM of the leg.  Laceration repaired with good skin approximation.  Tetanus updated.  Patient stable for discharge.  Return precautions discussed.        Pascal Lux Hopkins, PA-C 05/10/12 [phone]

## 2012-05-17 NOTE — ED Provider Notes (Signed)
Medical screening examination/treatment/procedure(s) were performed by non-physician practitioner and as supervising physician I was immediately available for consultation/collaboration.  Flint Melter, MD 05/17/12 2121

## 2012-05-17 NOTE — ED Provider Notes (Deleted)
Medical screening examination/treatment/procedure(s) were performed by non-physician practitioner and as supervising physician I was immediately available for consultation/collaboration.  Flint Melter, MD 05/17/12 2118

## 2012-11-26 ENCOUNTER — Telehealth: Payer: Self-pay

## 2012-11-26 ENCOUNTER — Telehealth: Payer: Self-pay | Admitting: Interventional Cardiology

## 2012-11-26 ENCOUNTER — Other Ambulatory Visit: Payer: Self-pay | Admitting: Interventional Cardiology

## 2012-11-26 NOTE — Telephone Encounter (Signed)
New message     Refill metoprolol   -----[facility]

## 2012-11-26 NOTE — Telephone Encounter (Signed)
Please review for refill of metoprolol, Im unable to view

## 2012-11-27 NOTE — Telephone Encounter (Signed)
done

## 2012-11-27 NOTE — Telephone Encounter (Signed)
Done

## 2012-12-12 ENCOUNTER — Other Ambulatory Visit: Payer: Self-pay | Admitting: Interventional Cardiology

## 2012-12-28 ENCOUNTER — Other Ambulatory Visit (HOSPITAL_COMMUNITY): Payer: Self-pay | Admitting: Obstetrics and Gynecology

## 2012-12-28 DIAGNOSIS — Z1231 Encounter for screening mammogram for malignant neoplasm of breast: Secondary | ICD-10-CM

## 2013-01-04 ENCOUNTER — Other Ambulatory Visit: Payer: Self-pay | Admitting: Dermatology

## 2013-01-25 ENCOUNTER — Ambulatory Visit (HOSPITAL_COMMUNITY)
Admission: RE | Admit: 2013-01-25 | Discharge: 2013-01-25 | Disposition: A | Discharge status: Home / Self Care | Payer: BC Managed Care – PPO | Source: Ambulatory Visit | Attending: Obstetrics and Gynecology | Admitting: Obstetrics and Gynecology

## 2013-01-25 DIAGNOSIS — Z1231 Encounter for screening mammogram for malignant neoplasm of breast: Secondary | ICD-10-CM | POA: Insufficient documentation

## 2013-04-15 ENCOUNTER — Encounter: Payer: Self-pay | Admitting: Interventional Cardiology

## 2013-05-21 ENCOUNTER — Other Ambulatory Visit: Payer: Self-pay | Admitting: Interventional Cardiology

## 2013-06-01 ENCOUNTER — Ambulatory Visit: Payer: BC Managed Care – PPO | Admitting: Interventional Cardiology

## 2013-06-14 ENCOUNTER — Ambulatory Visit (INDEPENDENT_AMBULATORY_CARE_PROVIDER_SITE_OTHER): Payer: BC Managed Care – PPO | Admitting: Interventional Cardiology

## 2013-06-14 ENCOUNTER — Encounter: Payer: Self-pay | Admitting: Interventional Cardiology

## 2013-06-14 VITALS — BP 120/72 | HR 51 | Ht 62.0 in | Wt 139.0 lb

## 2013-06-14 DIAGNOSIS — I7300 Raynaud's syndrome without gangrene: Secondary | ICD-10-CM | POA: Insufficient documentation

## 2013-06-14 DIAGNOSIS — Z953 Presence of xenogenic heart valve: Secondary | ICD-10-CM | POA: Insufficient documentation

## 2013-06-14 DIAGNOSIS — Z8679 Personal history of other diseases of the circulatory system: Secondary | ICD-10-CM

## 2013-06-14 DIAGNOSIS — I10 Essential (primary) hypertension: Secondary | ICD-10-CM

## 2013-06-14 NOTE — Patient Instructions (Signed)
Your physician recommends that you continue on your current medications as directed. Please refer to the Current Medication list given to you today.  Your physician has requested that you have an echocardiogram. Echocardiography is a painless test that uses sound waves to create images of your heart. It provides your doctor with information about the size and shape of your heart and how well your heart's chambers and valves are working. This procedure takes approximately one hour. There are no restrictions for this procedure.   Your physician wants you to follow-up in: 1 year You will receive a reminder letter in the mail two months in advance. If you don't receive a letter, please call our office to schedule the follow-up appointment.   

## 2013-06-14 NOTE — Progress Notes (Signed)
Patient ID: ELDA DUNKERSON, female   DOB: 11-23-1950, 63 y.o.   MRN: [id number]    [address] [place], [place]  [address] Phone: [phone] Fax:  [phone]  Date:  06/14/2013   ID:  NATSUMI WHITSITT, DOB 1950/10/20, MRN [id number]  PCP:  Horton Finer, MD   ASSESSMENT:  1. Bioprosthetic aortic valve with no clinical evidence of dysfunction 2. Hypertension and actually control 3. History of Raynaud's  PLAN:  1. Blood pressure is under adequate control and therefore no change in therapy is indicated 2. Low salt diet and active lifestyle 3. 2-D Doppler echocardiogram   SUBJECTIVE: LESTINE RAHE is a 63 y.o. female is asymptomatic. She understands endocarditis prophylaxis. She denies chills, fever, decreased appetite, and other complaints.   Wt Readings from Last 3 Encounters:  06/14/13 139 lb (63.05 kg)     Past Medical History  Diagnosis Date  . Hypertension     Current Outpatient Prescriptions  Medication Sig Dispense Refill  . aspirin EC 81 MG tablet Take 81 mg by mouth daily.      . cholecalciferol (VITAMIN D) 1000 UNITS tablet Take 1,000 Units by mouth daily.      Marland Kitchen estradiol (VIVELLE-DOT) 0.05 MG/24HR patch       . fluticasone (FLONASE) 50 MCG/ACT nasal spray Place 2 sprays into the nose daily.      . metoprolol succinate (TOPROL-XL) 25 MG 24 hr tablet TAKE 1 TABLET BY MOUTH DAILY  30 tablet  1  . TEKTURNA HCT 150-25 MG TABS TAKE 1 TABLET BY MOUTH DAILY  30 each  7   No current facility-administered medications for this visit.    Allergies:    Allergies  Allergen Reactions  . Sulfa Antibiotics     Social History:  The patient  reports that she has never smoked. She does not have any smokeless tobacco history on file. She reports that she does not drink alcohol or use illicit drugs.   ROS:  Please see the history of present illness.   Recent dental work but understands the dental prophylaxis should be performed   All other systems  reviewed and negative.   OBJECTIVE: VS:  BP 120/72  Pulse 51  Ht 5\' 2"  (1.575 m)  Wt 139 lb (63.05 kg)  BMI 25.42 kg/m2 Well nourished, well developed, in no acute distress HEENT: normal Neck: JVD flat. Carotid bruit 2+  Cardiac:  normal S1, S2; RRR; no murmur Lungs:  clear to auscultation bilaterally, no wheezing, rhonchi or rales Abd: soft, nontender, no hepatomegaly Ext: Edema absent. Pulses 2+ Skin: warm and dry Neuro:  CNs 2-12 intact, no focal abnormalities noted  EKG:  Normal sinus rhythm with leftward axis, nonspecific T wave abnormality, and a relatively low voltage       Signed, Illene Labrador III, MD 06/14/2013 5:46 PM

## 2013-07-02 ENCOUNTER — Ambulatory Visit (HOSPITAL_COMMUNITY): Payer: BC Managed Care – PPO | Attending: Interventional Cardiology | Admitting: Radiology

## 2013-07-02 ENCOUNTER — Other Ambulatory Visit: Payer: Self-pay

## 2013-07-02 DIAGNOSIS — I079 Rheumatic tricuspid valve disease, unspecified: Secondary | ICD-10-CM | POA: Insufficient documentation

## 2013-07-02 DIAGNOSIS — I359 Nonrheumatic aortic valve disorder, unspecified: Secondary | ICD-10-CM | POA: Insufficient documentation

## 2013-07-02 DIAGNOSIS — I10 Essential (primary) hypertension: Secondary | ICD-10-CM | POA: Insufficient documentation

## 2013-07-02 DIAGNOSIS — Z954 Presence of other heart-valve replacement: Secondary | ICD-10-CM | POA: Insufficient documentation

## 2013-07-02 DIAGNOSIS — Z8679 Personal history of other diseases of the circulatory system: Secondary | ICD-10-CM

## 2013-07-02 NOTE — Progress Notes (Signed)
Echocardiogram performed.  

## 2013-07-05 ENCOUNTER — Other Ambulatory Visit: Payer: Self-pay | Admitting: Dermatology

## 2013-07-06 ENCOUNTER — Telehealth: Payer: Self-pay

## 2013-07-06 NOTE — Telephone Encounter (Signed)
Message copied by Lamar Laundry on Tue Jul 06, 2013 10:26 AM ------      Message from: Daneen Schick      Created: Mon Jul 05, 2013  2:13 PM       Very good study with normal Aortic valve function ------

## 2013-07-06 NOTE — Telephone Encounter (Signed)
pt given echo results.Very good study with normal Aortic valve function.pt verbalized understanding.

## 2013-07-19 ENCOUNTER — Other Ambulatory Visit: Payer: Self-pay | Admitting: Interventional Cardiology

## 2013-08-09 ENCOUNTER — Other Ambulatory Visit: Payer: Self-pay | Admitting: Interventional Cardiology

## 2013-08-15 ENCOUNTER — Other Ambulatory Visit: Payer: Self-pay | Admitting: Interventional Cardiology

## 2013-08-16 NOTE — Telephone Encounter (Signed)
[name] III, MD at 06/14/2013  5:46 PM metoprolol succinate (TOPROL-XL) 25 MG 24 hr tablet  TAKE 1 TABLET BY MOUTH DAILY   30 tablet   1 Your physician recommends that you continue on your current medications as directed. Please refer to the Current Medication list given to you today.

## 2013-09-12 ENCOUNTER — Other Ambulatory Visit: Payer: Self-pay | Admitting: Interventional Cardiology

## 2013-12-22 ENCOUNTER — Other Ambulatory Visit: Payer: Self-pay | Admitting: Interventional Cardiology

## 2013-12-31 ENCOUNTER — Other Ambulatory Visit (HOSPITAL_COMMUNITY): Payer: Self-pay | Admitting: Obstetrics and Gynecology

## 2013-12-31 DIAGNOSIS — Z1231 Encounter for screening mammogram for malignant neoplasm of breast: Secondary | ICD-10-CM

## 2014-01-14 ENCOUNTER — Other Ambulatory Visit: Payer: Self-pay | Admitting: Interventional Cardiology

## 2014-01-26 ENCOUNTER — Ambulatory Visit (HOSPITAL_COMMUNITY): Payer: BC Managed Care – PPO

## 2014-02-02 ENCOUNTER — Ambulatory Visit (HOSPITAL_COMMUNITY)
Admission: RE | Admit: 2014-02-02 | Discharge: 2014-02-02 | Disposition: A | Discharge status: Home / Self Care | Payer: BLUE CROSS/BLUE SHIELD | Source: Ambulatory Visit | Attending: Obstetrics and Gynecology | Admitting: Obstetrics and Gynecology

## 2014-02-02 DIAGNOSIS — Z1231 Encounter for screening mammogram for malignant neoplasm of breast: Secondary | ICD-10-CM | POA: Diagnosis not present

## 2014-05-02 ENCOUNTER — Telehealth: Payer: Self-pay | Admitting: Interventional Cardiology

## 2014-05-02 NOTE — Telephone Encounter (Signed)
Will forward to [place] with Dr Tamala Julian

## 2014-05-02 NOTE — Telephone Encounter (Signed)
Pt c/o medication issue:  1. Name of Medication: Tekturna HCT 150-25mg    2. How are you currently taking this medication (dosage and times per day)? 1 per day   Comments: pt called states she will need a teir exception for this medication. [employer] will not cover it. Please assist

## 2014-05-05 ENCOUNTER — Encounter: Payer: Self-pay | Admitting: Interventional Cardiology

## 2014-05-11 NOTE — Telephone Encounter (Signed)
Called [employer] tel # provided by pt Member ID [id number]  Provider Line: [phone]  Left a detailed message on the prior auth line. Called to initiate prior auth for Tekturna HCT

## 2014-05-20 ENCOUNTER — Telehealth: Payer: Self-pay | Admitting: Interventional Cardiology

## 2014-05-20 NOTE — Telephone Encounter (Signed)
New message         Customer Service Rep states nurse faxed over a prescription for Tekturna on the wrong form   please give rep a call as this will result in denial for pt per rep

## 2014-05-24 ENCOUNTER — Telehealth: Payer: Self-pay | Admitting: Interventional Cardiology

## 2014-05-24 ENCOUNTER — Other Ambulatory Visit: Payer: Self-pay

## 2014-05-24 NOTE — Telephone Encounter (Signed)
Prior Auth   Medication:  [name]  Will need a non formulary request. medication is not on the medication list so they will need a non formulary request form. It was faxed over yesterday 05/23/2014. Will fax again just incase it wasn't received. Will need the request within 24 hours or the request will deny and the member will have to appeal

## 2014-05-24 NOTE — Telephone Encounter (Signed)
Completed form faxed to [facility]

## 2014-05-25 NOTE — Telephone Encounter (Signed)
Called [name] in response to her my chart message. [name] has appealed BC[employer] denila of her Tekturna HCT. She sts that the final decision will take 72hours. She has enough pills to last her a couple of days, and will purchase a couple if needed. Adv her Dr.Smith would like to know her specific reaction to Ace Inhibitors. She reports that it gave her a really bad cough. We will await BCBS decision, if denied Dr.Smith will determine an alternative. Adv her to contact the office if she receives an approval/ denial from [facility] before we do [name] verbalized understanding.

## 2014-05-27 NOTE — Telephone Encounter (Signed)
Pt Tekturna Hct was approved on appeal by [facility] through April 2017

## 2014-06-21 ENCOUNTER — Ambulatory Visit (INDEPENDENT_AMBULATORY_CARE_PROVIDER_SITE_OTHER): Payer: Medicare Other | Admitting: Interventional Cardiology

## 2014-06-21 ENCOUNTER — Encounter: Payer: Self-pay | Admitting: Interventional Cardiology

## 2014-06-21 VITALS — BP 126/80 | HR 60 | Ht 63.0 in | Wt 135.0 lb

## 2014-06-21 DIAGNOSIS — Z954 Presence of other heart-valve replacement: Secondary | ICD-10-CM

## 2014-06-21 DIAGNOSIS — I10 Essential (primary) hypertension: Secondary | ICD-10-CM

## 2014-06-21 DIAGNOSIS — Z953 Presence of xenogenic heart valve: Secondary | ICD-10-CM

## 2014-06-21 NOTE — Progress Notes (Signed)
Cardiology Office Note   Date:  06/21/2014   ID:  [name], DOB 02-Aug-1950, MRN [id number]  PCP:  Dorian Heckle, MD  Cardiologist:  Sinclair Grooms, MD   Chief Complaint  Patient presents with  . History of aortic valve replacement with bioprosthetic valve  . ESSENTIAL HYPERTENSION      History of Present Illness: [name] is a 64 y.o. female who presents for bioprosthetic aortic valve (2011) and essential hypertension.  She is doing well. She has no cardiopulmonary complaints. The insurance company attempted to force a change in antihypotensive therapy asking [place] to switch from Tekturna HCT to other therapies. In the past she failed ARB therapy and Ace therapy because of side effects. Amlodipine caused side effects.  She has not had palpitations or syncope. Is no peripheral edema.  Past Medical History  Diagnosis Date  . Hypertension   . Hyperplastic colon polyp 2009  . Raynaud's disease   . Melanoma   . S/P AVR     No past surgical history on file.   Current Outpatient Prescriptions  Medication Sig Dispense Refill  . aspirin EC 81 MG tablet Take 81 mg by mouth daily.    . cephALEXin (KEFLEX) 500 MG capsule Take 500 mg by mouth 4 (four) times daily as needed ((DENTAL AND DERMATOLOGY PROCEDURES)).     . cholecalciferol (VITAMIN D) 1000 UNITS tablet Take 1,000 Units by mouth daily.    Marland Kitchen estradiol (CLIMARA - DOSED IN MG/24 HR) 0.0375 mg/24hr patch Place 0.0375 mg onto the skin once a week.    . fluticasone (FLONASE) 50 MCG/ACT nasal spray Place 2 sprays into the nose daily as needed for allergies.     . metoprolol succinate (TOPROL-XL) 25 MG 24 hr tablet TAKE 1 TABLET BY MOUTH EVERY DAY 30 tablet 10  . TEKTURNA HCT 150-25 MG TABS TAKE 1 TABLET BY MOUTH DAILY 30 each 5   No current facility-administered medications for this visit.    Allergies:   Ace inhibitors; Sulfa antibiotics; and Amlodipine    Social History:  The patient  reports that she has  quit smoking. She has never used smokeless tobacco. She reports that she does not drink alcohol or use illicit drugs.   Family History:  The patient's family history includes Breast cancer in her mother; Cancer in her father; Healthy in her sister; Heart failure in her mother; Hypertension in her brother, father, sister, and sister; Osteoporosis in her sister.    ROS:  Please see the history of present illness.   Otherwise, review of systems are positive for none.   All other systems are reviewed and negative.    PHYSICAL EXAM: VS:  BP 126/80 mmHg  Pulse 60  Ht 5\' 3"  (1.6 m)  Wt 135 lb (61.236 kg)  BMI 23.92 kg/m2 , BMI Body mass index is 23.92 kg/(m^2). GEN: Well nourished, well developed, in no acute distress HEENT: normal Neck: no JVD, carotid bruits, or masses Cardiac: RRR; no murmurs, rubs, or gallops,no edema  Respiratory:  clear to auscultation bilaterally, normal work of breathing GI: soft, nontender, nondistended, + BS MS: no deformity or atrophy Skin: warm and dry, no rash Neuro:  Strength and sensation are intact Psych: euthymic mood, full affect   EKG:  EKG is ordered today. The ekg ordered today demonstrates sinus bradycardia with left atrial abnormality and anterior T-wave abnormality with inferior T-wave abnormality.   Recent Labs: No results found for requested labs within last 365  days.    Lipid Panel No results found for: CHOL, TRIG, HDL, CHOLHDL, VLDL, LDLCALC, LDLDIRECT    Wt Readings from Last 3 Encounters:  06/21/14 135 lb (61.236 kg)  06/14/13 139 lb (63.05 kg)      Other studies Reviewed: Additional studies/ records that were reviewed today include: None. Review of the above records demonstrates:    ASSESSMENT AND PLAN:  History of aortic valve replacement with bioprosthetic valve - doing well without clinical evidence of dysfunction  Essential hypertension - well controlled     Current medicines are reviewed at length with the patient  today.  The patient does not have concerns regarding medicines.  The following changes have been made:  no change  Labs/ tests ordered today include:  No orders of the defined types were placed in this encounter.     Disposition:   FU with HS in 1 year  Signed, Sinclair Grooms, MD  06/21/2014 2:12 PM    [facility] [place], [place], [place]  [address] Phone: [phone]; Fax: [phone]

## 2014-06-21 NOTE — Patient Instructions (Signed)

## 2014-07-23 ENCOUNTER — Other Ambulatory Visit: Payer: Self-pay | Admitting: Interventional Cardiology

## 2014-07-25 ENCOUNTER — Other Ambulatory Visit: Payer: Self-pay

## 2014-07-25 MED ORDER — ALISKIREN-HYDROCHLOROTHIAZIDE 150-25 MG PO TABS: 1.0000 | ORAL_TABLET | Freq: Every day | ORAL | Status: DC

## 2014-11-12 ENCOUNTER — Other Ambulatory Visit: Payer: Self-pay | Admitting: Interventional Cardiology

## 2014-12-29 ENCOUNTER — Other Ambulatory Visit: Payer: Self-pay | Admitting: Interventional Cardiology

## 2015-01-10 ENCOUNTER — Other Ambulatory Visit: Payer: Self-pay

## 2015-01-10 DIAGNOSIS — Z1231 Encounter for screening mammogram for malignant neoplasm of breast: Secondary | ICD-10-CM

## 2015-01-16 ENCOUNTER — Telehealth: Payer: Self-pay | Admitting: Interventional Cardiology

## 2015-01-16 NOTE — Telephone Encounter (Signed)
Pt c/o BP issue: STAT if pt c/o blurred vision, one-sided weakness or slurred speech  1. What are your last 5 BP readings? 173/107 am 151/93 pm- pt stated pulse is fine  2. Are you having any other symptoms (ex. Dizziness, headache, blurred vision, passed out)? No   3. What is your BP issue? Pt stated has high bp- has appt 12/27 w/ luke.   Pt stated she takes metoprolol 25 mg- has taken 50 mg in the past- should she go back to double dose. Please call back and discuss.

## 2015-01-16 NOTE — Telephone Encounter (Signed)
Start Bystolic 10 mg daily and stop Metoprolol.

## 2015-01-16 NOTE — Telephone Encounter (Signed)
Returned pt call. Pt sts that her bp has been elevated all week. Pt is asymptomatic. Pt has provided 2 readings 173/107, 151/93. Pt sts that her heartrate has been between 66-70bpm. Pt was previously on Metoprolol 50mg  qd, it was reduced to 25mg  qd because of bradycardia. Adv pt that Dr.Smith is currently out of the office. I will fwd Dr.Smith a message and call back with his recommendations. Pt agreeable with plan and verbalized understanding.

## 2015-01-17 ENCOUNTER — Telehealth: Payer: Self-pay | Admitting: Interventional Cardiology

## 2015-01-17 ENCOUNTER — Encounter: Payer: Self-pay | Admitting: Interventional Cardiology

## 2015-01-17 ENCOUNTER — Telehealth: Payer: Self-pay

## 2015-01-17 MED ORDER — NEBIVOLOL HCL 10 MG PO TABS: 10.0000 mg | ORAL_TABLET | Freq: Every day | ORAL | Status: DC

## 2015-01-17 NOTE — Telephone Encounter (Signed)
Prior auth for Bystolic 5mg  sent to [employer].

## 2015-01-17 NOTE — Telephone Encounter (Signed)
New Message  Medication Denial from [place] exception request for bystolic 10 mg  Has been denied will send letter to patient and provider. Any questions please call back.   Please call back with pt demographics as well, their office doesn't have a reliable number for the patient.

## 2015-01-17 NOTE — Telephone Encounter (Signed)
Pt aware of Dr.Smith's recommendation below. Rx sent to pt pharmacy for Bystolic 10mg  qd. Pt will f/u with Lurena Joiner K,PA as planned. Pt agreeable with plan and verbalized understanding.

## 2015-01-18 NOTE — Telephone Encounter (Signed)
Pt bp readings noted. Pt adv via my chart that Dr.Smith is out of the office, I will fwd him an update on her bp readings and that Bystolic has been denied by her insurance. I will call pt back with Dr.Smith's recommendation.

## 2015-01-18 NOTE — Telephone Encounter (Signed)
Waiting on [employer] to call back with preferred alternatives.

## 2015-01-18 NOTE — Telephone Encounter (Signed)
Follow up     Pt c/o BP issue: STAT if pt c/o blurred vision, one-sided weakness or slurred speech  1. What are your last 5 BP readings? 153/100@7 :30am HR 65 took rx and 148/98 3hrs later 2. Are you having any other symptoms (ex. Dizziness, headache, blurred vision, passed out)? no  3. What is your BP issue? Pt thinks the metoprolol is not working.  Please advise

## 2015-01-19 NOTE — Telephone Encounter (Signed)
Discontinue aorta for by systolic. Continue metoprolol. She has previously been on amlodipine, but we need to re-try this medication at 5 mg daily.

## 2015-01-20 MED ORDER — AMLODIPINE BESYLATE 5 MG PO TABS: 5.0000 mg | ORAL_TABLET | Freq: Every day | ORAL | Status: DC

## 2015-01-20 NOTE — Telephone Encounter (Signed)
The patient is aware of Dr. Thompson Caul recommendations. She has been on amlodipine in the past and had some swelling.  She is ok with starting this over the weekend. She has follow up with Kerin Ransom, [place] on 12/27.

## 2015-01-24 ENCOUNTER — Encounter: Payer: Self-pay | Admitting: Cardiology

## 2015-01-24 ENCOUNTER — Ambulatory Visit (INDEPENDENT_AMBULATORY_CARE_PROVIDER_SITE_OTHER): Payer: Medicare Other | Admitting: Cardiology

## 2015-01-24 VITALS — BP 136/98 | HR 59 | Ht 63.5 in | Wt 143.0 lb

## 2015-01-24 DIAGNOSIS — I10 Essential (primary) hypertension: Secondary | ICD-10-CM

## 2015-01-24 DIAGNOSIS — Z954 Presence of other heart-valve replacement: Secondary | ICD-10-CM | POA: Diagnosis not present

## 2015-01-24 DIAGNOSIS — Z953 Presence of xenogenic heart valve: Secondary | ICD-10-CM

## 2015-01-24 NOTE — Progress Notes (Addendum)
01/24/2015 [name]   October 04, 1950  [id number]  Primary Physician Irven Shelling, MD Primary Cardiologist: Dr Tamala Julian  HPI:  Pleasant 64 y/o female followed by Dr Tamala Julian with a history of tissue AVR in 2011. She had an echo in June 2015 that showed normal LVF and normal prosthetic valve function. She denies any chest pain or dyspnea. She has recently had a spike in her B/P. Dr Tamala Julian suggested she try Norvasc 5 mg daily (she had some issues with LE edema in the past on Norvasc). She started this 5 days ago. She is in the office today for follow up. She has reading from hoe  That show a marked improvement in her B/P since starting Norvasc though her B/P in the office was 142/98.     Current Outpatient Prescriptions  Medication Sig Dispense Refill  . amLODipine (NORVASC) 5 MG tablet Take 5 mg by mouth daily.    Marland Kitchen aspirin EC 81 MG tablet Take 81 mg by mouth daily.    . cephALEXin (KEFLEX) 500 MG capsule Take 500 mg by mouth 4 (four) times daily as needed ((DENTAL AND DERMATOLOGY PROCEDURES)).     . cholecalciferol (VITAMIN D) 1000 UNITS tablet Take 1,000 Units by mouth daily.    Marland Kitchen estradiol (CLIMARA - DOSED IN MG/24 HR) 0.0375 mg/24hr patch Place 0.0375 mg onto the skin once a week.    . fluticasone (FLONASE) 50 MCG/ACT nasal spray Place 2 sprays into the nose daily as needed for allergies.     . metoprolol succinate (TOPROL-XL) 25 MG 24 hr tablet Take 25 mg by mouth daily.    . TEKTURNA HCT 150-25 MG TABS TAKE 1 TABLET BY MOUTH DAILY 30 each 11   No current facility-administered medications for this visit.    Allergies  Allergen Reactions  . Ace Inhibitors Cough       . Sulfa Antibiotics Other (See Comments)    REACTION: "MASSIVE HEADACHE"  . Amlodipine Other (See Comments)    ANKLE SWELLING at high dose    Social History   Social History  . Marital Status: Widowed    Spouse Name: N/A  . Number of Children: N/A  . Years of Education: N/A   Occupational History  .  Not on file.   Social History Main Topics  . Smoking status: Former Research scientist (life sciences)  . Smokeless tobacco: Never Used  . Alcohol Use: No  . Drug Use: No  . Sexual Activity: Not on file   Other Topics Concern  . Not on file   Social History Narrative     Review of Systems: General: negative for chills, fever, night sweats or weight changes.  Cardiovascular: negative for chest pain, dyspnea on exertion, edema, orthopnea, palpitations, paroxysmal nocturnal dyspnea or shortness of breath Dermatological: negative for rash Respiratory: negative for cough or wheezing Urologic: negative for hematuria Abdominal: negative for nausea, vomiting, diarrhea, bright red blood per rectum, melena, or hematemesis Neurologic: negative for visual changes, syncope, or dizziness All other systems reviewed and are otherwise negative except as noted above.    Blood pressure 136/98, pulse 59, height 5' 3.5" (1.613 m), weight 143 lb (64.864 kg).  General appearance: alert, cooperative and no distress Neck: no carotid bruit and no JVD Lungs: clear to auscultation bilaterally Heart: regular rate and rhythm and soft systolic murmur AOV Extremities: extremities normal, atraumatic, no cyanosis or edema Skin: Skin color, texture, turgor normal. No rashes or lesions Neurologic: Grossly normal  EKG NSR, SB 58  ASSESSMENT  AND PLAN:   History of aortic valve replacement with bioprosthetic valve 2011. Echo June 2015- normal LVF and normal prosthetic valve function.  Essential hypertension Recent increase in B/P necessitating medication adjustments   PLAN  I suggested she continue to monitor her B/P at home. I told her it may take two to see the full effect of Norvasc on her B/P. She'll contact us if her systolic consistently runs > [id number] ro her diastolic runs > 90. If further adjustments are required I would suggest the addition of Chlorthalidone.   Erlene Quan PA-C 01/24/2015 8:45 AM   I reviewed Ms Hecht's  lab from Dr Lavone Orn- Chol-212, LDL 98, HDL 75. BUN 17/ SCr 0.73

## 2015-01-24 NOTE — Assessment & Plan Note (Signed)
2011. Echo June 2015- normal LVF and normal prosthetic valve function.

## 2015-01-24 NOTE — Patient Instructions (Signed)
Please follow-up with Dr Linard Millers in May. You will receive a reminder letter in the mail two months in advance. If you don't receive a letter, please call our office to schedule the follow-up appointment.  If you need a refill on your cardiac medications before your next appointment, please call your pharmacy.

## 2015-01-24 NOTE — Assessment & Plan Note (Signed)
Recent increase in B/P necessitating medication adjustments

## 2015-02-07 ENCOUNTER — Ambulatory Visit
Admission: RE | Admit: 2015-02-07 | Discharge: 2015-02-07 | Disposition: A | Discharge status: Home / Self Care | Payer: Medicare Other | Source: Ambulatory Visit

## 2015-02-07 DIAGNOSIS — Z1231 Encounter for screening mammogram for malignant neoplasm of breast: Secondary | ICD-10-CM

## 2015-02-15 ENCOUNTER — Encounter: Payer: Self-pay | Admitting: Interventional Cardiology

## 2015-02-17 ENCOUNTER — Encounter: Payer: Self-pay | Admitting: Interventional Cardiology

## 2015-02-17 ENCOUNTER — Telehealth: Payer: Self-pay

## 2015-02-17 NOTE — Telephone Encounter (Signed)
Phone call from [place] with approval of [place]. Patient will receive a letter with this infor as well.

## 2015-02-22 ENCOUNTER — Encounter: Payer: Self-pay | Admitting: Interventional Cardiology

## 2015-03-30 ENCOUNTER — Ambulatory Visit (INDEPENDENT_AMBULATORY_CARE_PROVIDER_SITE_OTHER): Payer: Medicare Other | Admitting: Physician Assistant

## 2015-03-30 ENCOUNTER — Encounter: Payer: Self-pay | Admitting: Physician Assistant

## 2015-03-30 VITALS — BP 138/88 | HR 72 | Ht 63.5 in | Wt 145.6 lb

## 2015-03-30 DIAGNOSIS — I10 Essential (primary) hypertension: Secondary | ICD-10-CM | POA: Diagnosis not present

## 2015-03-30 MED ORDER — AMLODIPINE BESYLATE 2.5 MG PO TABS: 2.5000 mg | ORAL_TABLET | Freq: Every day | ORAL | Status: DC

## 2015-03-30 NOTE — Progress Notes (Signed)
Cardiology Office Note   Date:  03/30/2015   ID:  [name], DOB [birth year], MRN [id number]  PCP:  Irven Shelling, MD  Cardiologist:  Dr. Tamala Julian    CC: medication adjustments.    History of Present Illness: [name] is a 65 y.o. female HTN, tissue AVR (2011) and Raynaud's disease who presents to clinic for "medication adjustments."  She had tissue AVR in 2011. She had an echo in June 2015 that showed normal LVF and normal prosthetic valve function. She denies any chest pain or dyspnea. She was last seen by Kerin Ransom PA-C in 12/2014 for evaluation of spike in BP and recent start on Norvasc 5mg  by Dr. Tamala Julian for elevated BPs.  In the past she failed ARB therapy and ACE as well as amlodipine due to SEs. Had bradycardia with increased metoprolol.   Current HTN regimen: Norvasc 5mg  daily Toprol XL 25mg  daily  Tekturna HCT 150-25mg  daily.    She denies CP or SOB. No LE edema, orthopnea or PND. No palpations. No blood in her stool or urine. No dizziness or syncope. She has been having LE edema that is worse in the afternoons that bothers her aesthetically. This started occuring since starting th norvasc.   Past Medical History  Diagnosis Date  . Hypertension   . Hyperplastic colon polyp 2009  . Raynaud's disease   . Melanoma ([place])   . S/P AVR     No past surgical history on file.   Current Outpatient Prescriptions  Medication Sig Dispense Refill  . amLODipine (NORVASC) 5 MG tablet Take 5 mg by mouth daily.    Marland Kitchen aspirin EC 81 MG tablet Take 81 mg by mouth daily.    . cephALEXin (KEFLEX) 500 MG capsule Take 500 mg by mouth 4 (four) times daily as needed ((DENTAL AND DERMATOLOGY PROCEDURES)).     . cholecalciferol (VITAMIN D) 1000 UNITS tablet Take 1,000 Units by mouth daily.    . fluticasone (FLONASE) 50 MCG/ACT nasal spray Place 2 sprays into the nose daily as needed for allergies.     . metoprolol succinate (TOPROL-XL) 25 MG 24 hr tablet Take 25 mg by mouth  daily.    . TEKTURNA HCT 150-25 MG TABS TAKE 1 TABLET BY MOUTH DAILY 30 each 11   No current facility-administered medications for this visit.    Allergies:   Ace inhibitors; Sulfa antibiotics; and Amlodipine    Social History:  The patient  reports that she has quit smoking. She has never used smokeless tobacco. She reports that she does not drink alcohol or use illicit drugs.   Family History:  The patient's family history includes Breast cancer in her mother; Cancer in her father; Healthy in her sister; Heart failure in her mother; Hypertension in her brother, father, sister, and sister; Osteoporosis in her sister.    ROS:  Please see the history of present illness.   Otherwise, review of systems are positive for none.   All other systems are reviewed and negative.    PHYSICAL EXAM: VS:  BP 138/88 mmHg  Pulse 72  Ht 5' 3.5" (1.613 m)  Wt 145 lb 9.6 oz (66.044 kg)  BMI 25.38 kg/m2 , BMI Body mass index is 25.38 kg/(m^2). GEN: Well nourished, well developed, in no acute distress HEENT: normal Neck: no JVD, carotid bruits, or masses Cardiac: RRR; + SEM, rubs, or gallops, trace bilateral LE edema  Respiratory:  clear to auscultation bilaterally, normal work of breathing GI: soft,  nontender, nondistended, + BS MS: no deformity or atrophy Skin: warm and dry, no rash Neuro:  Strength and sensation are intact Psych: euthymic mood, full affect   EKG:  EKG is not ordered today.    Recent Labs: No results found for requested labs within last 365 days.    Lipid Panel No results found for: CHOL, TRIG, HDL, CHOLHDL, VLDL, LDLCALC, LDLDIRECT    Wt Readings from Last 3 Encounters:  03/30/15 145 lb 9.6 oz (66.044 kg)  01/24/15 143 lb (64.864 kg)  06/21/14 135 lb (61.236 kg)      Other studies Reviewed: Additional studies/ records that were reviewed today include: 2D ECHO Review of the above records demonstrates:   2D ECHO: 07/02/2013 LV EF: 55% -  60% Study  Conclusions - Left ventricle: The cavity size was normal. There was mild focal basal hypertrophy of the septum. Systolic function was normal. The estimated ejection fraction was in the range of 55% to 60%. Wall motion was normal; there were no regional wall motion abnormalities. Doppler parameters are consistent with abnormal left ventricular relaxation (grade 1 diastolic dysfunction). - Aortic valve: A bioprosthesis was present. There was trivial regurgitation. - Tricuspid valve: There was moderate regurgitation. - Pulmonary arteries: Systolic pressure was mildly increased. PA peak pressure: 35 mm Hg (S). Impressions: - Normal LV function; bioprosthetic aortic valve with normal mean gradient of 14 mmHg; moderate TR with mildly elevated pulmonary pressures.   ASSESSMENT AND PLAN: [name] is a 65 y.o. female HTN, tissue AVR (2011) and Raynaud's disease who presents to clinic for "medication adjustments."  History of aortic valve replacement with bioprosthetic valve 2011. Echo June 2015- normal LVF and normal prosthetic valve function. No complaints today.   Essential hypertension: She is having some swelling on amlodipine 5mg  daily but it is keeping her BP under great control. We will try decreasing it to 2.5mg  daily and have her keep a record of her BPs. Hopefully, this will help the swelling and still control her BP. If not we will have to try another medication. With all her previous intolerances ( see HPI) we will likely have to try hydralazine 15mg  TID. Continue Toprol XL 25mg  daily and Tekturna HCT 150-25mg  daily.    Current medicines are reviewed at length with the patient today.  The patient has concerns regarding medicines.  The following changes have been made:  Decrease amlodipine from 5mg  to 2.5mg  daily.   Labs/ tests ordered today include:  No orders of the defined types were placed in this encounter.     Disposition:   FU with Dr. Tamala Julian as  previously scheduled   Signed, Crista Luria  03/30/2015 11:55 AM    [facility], [place], [place]  [address] Phone: [phone]; Fax: [phone]

## 2015-03-30 NOTE — Patient Instructions (Signed)
Medication Instructions:  Your physician has recommended you make the following change in your medication:  1.  DECREASE the Amlodipine to 2.5 taking 1 daily  Labwork: TODAY:  Testing/Procedures: None ordered  Follow-Up: Your physician recommends that you schedule a follow-up appointment in: 2 [place]  Any Other Special Instructions Will Be Listed Below (If Applicable).     If you need a refill on your cardiac medications before your next appointment, please call your pharmacy.

## 2015-04-09 ENCOUNTER — Encounter: Payer: Self-pay | Admitting: Physician Assistant

## 2015-06-29 ENCOUNTER — Encounter: Payer: Self-pay | Admitting: Interventional Cardiology

## 2015-06-29 ENCOUNTER — Ambulatory Visit (INDEPENDENT_AMBULATORY_CARE_PROVIDER_SITE_OTHER): Payer: Medicare Other | Admitting: Interventional Cardiology

## 2015-06-29 VITALS — BP 160/94 | HR 64 | Ht 63.0 in | Wt 145.4 lb

## 2015-06-29 DIAGNOSIS — I5042 Chronic combined systolic (congestive) and diastolic (congestive) heart failure: Secondary | ICD-10-CM

## 2015-06-29 DIAGNOSIS — I10 Essential (primary) hypertension: Secondary | ICD-10-CM

## 2015-06-29 DIAGNOSIS — Z954 Presence of other heart-valve replacement: Secondary | ICD-10-CM | POA: Diagnosis not present

## 2015-06-29 DIAGNOSIS — Z953 Presence of xenogenic heart valve: Secondary | ICD-10-CM

## 2015-06-29 NOTE — Patient Instructions (Signed)
Medication Instructions:  Your physician recommends that you continue on your current medications as directed. Please refer to the Current Medication list given to you today.   Labwork: None ordered  Testing/Procedures: None ordered  Follow-Up: Your physician wants you to follow-up in: 1 year You will receive a reminder letter in the mail two months in advance. If you don't receive a letter, please call our office to schedule the follow-up appointment.   Any Other Special Instructions Will Be Listed Below (If Applicable). Your physician has requested that you regularly monitor and record your blood pressure readings at home. Please use the same machine at the same time of day to check your readings and record them.  Your physician discussed the importance of regular exercise and recommended that you start or continue a regular exercise program for good health.  Your physician encouraged you to lose weight for better health.  Limit your sodium intake to 2 grams daily    If you need a refill on your cardiac medications before your next appointment, please call your pharmacy.  Marland Kitchen

## 2015-06-29 NOTE — Progress Notes (Signed)
Cardiology Office Note    Date:  06/29/2015   ID:  [name], DOB [birth year], MRN [id number]  PCP:  Irven Shelling, MD  Cardiologist: Sinclair Grooms, MD   Chief Complaint  Patient presents with  . Follow-up    Aortic valve disease    History of Present Illness:  [name] is a 65 y.o. female follow-up of aortic valve replacement, and hypertension.  Has no complaints. She has gained significant weight since last being evaluated. She denies dyspnea, orthopnea, PND, and edema. No palpitations or cardiopulmonary complaints.  Past Medical History  Diagnosis Date  . Hypertension   . Hyperplastic colon polyp 2009  . Raynaud's disease   . Melanoma ([place])   . S/P AVR     No past surgical history on file.  Current Medications: Outpatient Prescriptions Prior to Visit  Medication Sig Dispense Refill  . amLODipine (NORVASC) 2.5 MG tablet Take 1 tablet (2.5 mg total) by mouth daily. 90 tablet 3  . aspirin EC 81 MG tablet Take 81 mg by mouth daily.    . cephALEXin (KEFLEX) 500 MG capsule Take 500 mg by mouth 4 (four) times daily as needed ((DENTAL AND DERMATOLOGY PROCEDURES)).     . cholecalciferol (VITAMIN D) 1000 UNITS tablet Take 1,000 Units by mouth daily.    . fluticasone (FLONASE) 50 MCG/ACT nasal spray Place 2 sprays into the nose daily as needed for allergies.     . metoprolol succinate (TOPROL-XL) 25 MG 24 hr tablet Take 25 mg by mouth daily.    . TEKTURNA HCT 150-25 MG TABS TAKE 1 TABLET BY MOUTH DAILY 30 each 11   No facility-administered medications prior to visit.     Allergies:   Ace inhibitors; Sulfa antibiotics; and Amlodipine   Social History   Social History  . Marital Status: Widowed    Spouse Name: N/A  . Number of Children: N/A  . Years of Education: N/A   Social History Main Topics  . Smoking status: Former Research scientist (life sciences)  . Smokeless tobacco: Never Used  . Alcohol Use: No  . Drug Use: No  . Sexual Activity: Not Asked   Other Topics  Concern  . None   Social History Narrative     Family History:  The patient's family history includes Breast cancer in her mother; Cancer in her father; Healthy in her sister; Heart failure in her mother; Hypertension in her brother, father, sister, and sister; Osteoporosis in her sister.   ROS:   Please see the history of present illness.    Increase caloric intake and associated weight gain.All other systems reviewed and are negative.   PHYSICAL EXAM:   VS:  BP 160/94 mmHg  Pulse 64  Ht 5\' 3"  (1.6 m)  Wt 145 lb 6.4 oz (65.953 kg)  BMI 25.76 kg/m2   GEN: Well nourished, well developed, in no acute distress HEENT: normal Neck: no JVD, carotid bruits, or masses Cardiac: RRR; no murmurs, rubs, or gallops,no edema  Respiratory:  clear to auscultation bilaterally, normal work of breathing GI: soft, nontender, nondistended, + BS MS: no deformity or atrophy Skin: warm and dry, no rash Neuro:  Alert and Oriented x 3, Strength and sensation are intact Psych: euthymic mood, full affect  Wt Readings from Last 3 Encounters:  06/29/15 145 lb 6.4 oz (65.953 kg)  03/30/15 145 lb 9.6 oz (66.044 kg)  01/24/15 143 lb (64.864 kg)      Studies/Labs Reviewed:   EKG:  EKG  Was not performed today. On 01/24/15, biatrial abnormality along with sinus rhythm was noted.   Recent Labs: No results found for requested labs within last 365 days.   Lipid Panel No results found for: CHOL, TRIG, HDL, CHOLHDL, VLDL, LDLCALC, LDLDIRECT  Additional studies/ records that were reviewed today include:    no new data of significance was reviewed.   ASSESSMENT:    1. History of aortic valve replacement with bioprosthetic valve   2. Essential hypertension   3. Chronic combined systolic and diastolic heart failure (HCC)      PLAN:  In order of problems listed above:  1. By auscultation the valve function is normal. Continue to follow clinically. Discussed endocarditis prophylaxis.  2.  Low salt  diet, weight loss, aerobic exercise, and consider increasing amlodipine to 5 mg daily. The blood pressure medication increase will not be started today but she promises to provide additional blood pressure recordings over the next week. If her remained consistently above 140/90 mmHg, amlodipine and perhaps metoprolol doses will need to be uptitrated.  3.  There is no evidence of volume overload. Will follow 2 g sodium diet.   Medication Adjustments/Labs and Tests Ordered: Current medicines are reviewed at length with the patient today.  Concerns regarding medicines are outlined above.  Medication changes, Labs and Tests ordered today are listed in the Patient Instructions below. There are no Patient Instructions on file for this visit.   Signed, Sinclair Grooms, MD  06/29/2015 2:14 PM    [facility] [place], [place], [place]  [address] Phone: [phone]; Fax: [phone]

## 2015-08-31 ENCOUNTER — Encounter: Payer: Self-pay | Admitting: Interventional Cardiology

## 2015-09-01 ENCOUNTER — Other Ambulatory Visit: Payer: Self-pay

## 2015-09-01 ENCOUNTER — Encounter: Payer: Self-pay | Admitting: Interventional Cardiology

## 2015-09-01 DIAGNOSIS — I10 Essential (primary) hypertension: Secondary | ICD-10-CM

## 2015-09-01 MED ORDER — LOSARTAN POTASSIUM-HCTZ 100-25 MG PO TABS: 1.0000 | ORAL_TABLET | Freq: Every day | ORAL | 11 refills | Status: DC

## 2015-09-01 NOTE — Telephone Encounter (Signed)
-----   Message from Belva Crome, MD sent at 08/31/2015  5:08 PM EDT ----- Regarding: Stopping Tekturna HCT Switch to losartan HCT 100/25 mg daily. This will be started the next day after the last Tekturna HCT. 10 days later laboratory work will need to be done - Basic metabolic panel. Lattie Haw please call with confirmation of prescription and appointment for laboratory data.

## 2015-09-01 NOTE — Telephone Encounter (Signed)
Pt aware of Dr.Smith's recommendation. Switch to losartan HCT 100/25 mg daily. This will be started the next day after the last Tekturna HCT. 10 days later laboratory work will need to be done - Basic metabolic panel. Lattie Haw please call with confirmation of prescription and appointment for laboratory data  Rx sent to pt's pharmacy. Pt sts that she still has a supply of Tekturna on hand, she will call to schedule a lab appt for 10days after starting Losartan HCT 100-25mg  qd.  Pt is requesting to also have a HgA1c drawn that day, her glucose number was elevated when last checked by her pcp, she has been careful with her diet and would like to have it rechecked, adv her I will need to get ab ok from [place] before ordering. Pt verbalized understanding.

## 2015-10-20 ENCOUNTER — Other Ambulatory Visit: Payer: Medicare Other | Admitting: *Deleted

## 2015-10-20 DIAGNOSIS — I10 Essential (primary) hypertension: Secondary | ICD-10-CM

## 2015-10-20 LAB — BASIC METABOLIC PANEL
BUN: 16 mg/dL (ref 7–25)
CALCIUM: 10 mg/dL (ref 8.6–10.4)
CO2: 27 mmol/L (ref 20–31)
Chloride: 103 mmol/L (ref 98–110)
Creat: 0.74 mg/dL (ref 0.50–0.99)
GLUCOSE: 99 mg/dL (ref 65–99)
Potassium: 4.1 mmol/L (ref 3.5–5.3)
SODIUM: 142 mmol/L (ref 135–146)

## 2015-10-23 ENCOUNTER — Other Ambulatory Visit: Payer: Self-pay | Admitting: Interventional Cardiology

## 2015-10-23 DIAGNOSIS — Z952 Presence of prosthetic heart valve: Secondary | ICD-10-CM

## 2015-11-13 ENCOUNTER — Other Ambulatory Visit: Payer: Self-pay | Admitting: Interventional Cardiology

## 2016-01-03 ENCOUNTER — Other Ambulatory Visit: Payer: Self-pay | Admitting: Internal Medicine

## 2016-01-03 DIAGNOSIS — Z1231 Encounter for screening mammogram for malignant neoplasm of breast: Secondary | ICD-10-CM

## 2016-02-05 DIAGNOSIS — Z1389 Encounter for screening for other disorder: Secondary | ICD-10-CM | POA: Diagnosis not present

## 2016-02-05 DIAGNOSIS — I10 Essential (primary) hypertension: Secondary | ICD-10-CM | POA: Diagnosis not present

## 2016-02-05 DIAGNOSIS — Z0000 Encounter for general adult medical examination without abnormal findings: Secondary | ICD-10-CM | POA: Diagnosis not present

## 2016-02-05 DIAGNOSIS — I480 Paroxysmal atrial fibrillation: Secondary | ICD-10-CM | POA: Diagnosis not present

## 2016-02-07 DIAGNOSIS — R69 Illness, unspecified: Secondary | ICD-10-CM | POA: Diagnosis not present

## 2016-02-09 ENCOUNTER — Ambulatory Visit
Admission: RE | Admit: 2016-02-09 | Discharge: 2016-02-09 | Disposition: A | Discharge status: Home / Self Care | Payer: Medicare HMO | Source: Ambulatory Visit | Attending: Internal Medicine | Admitting: Internal Medicine

## 2016-02-09 DIAGNOSIS — Z1231 Encounter for screening mammogram for malignant neoplasm of breast: Secondary | ICD-10-CM | POA: Diagnosis not present

## 2016-02-12 DIAGNOSIS — B009 Herpesviral infection, unspecified: Secondary | ICD-10-CM | POA: Diagnosis not present

## 2016-02-12 DIAGNOSIS — L0109 Other impetigo: Secondary | ICD-10-CM | POA: Diagnosis not present

## 2016-02-12 DIAGNOSIS — Z85828 Personal history of other malignant neoplasm of skin: Secondary | ICD-10-CM | POA: Diagnosis not present

## 2016-02-12 DIAGNOSIS — L0889 Other specified local infections of the skin and subcutaneous tissue: Secondary | ICD-10-CM | POA: Diagnosis not present

## 2016-02-28 ENCOUNTER — Other Ambulatory Visit: Payer: Self-pay | Admitting: Physician Assistant

## 2016-04-15 DIAGNOSIS — D1801 Hemangioma of skin and subcutaneous tissue: Secondary | ICD-10-CM | POA: Diagnosis not present

## 2016-04-15 DIAGNOSIS — L821 Other seborrheic keratosis: Secondary | ICD-10-CM | POA: Diagnosis not present

## 2016-04-15 DIAGNOSIS — Z85820 Personal history of malignant melanoma of skin: Secondary | ICD-10-CM | POA: Diagnosis not present

## 2016-04-15 DIAGNOSIS — Z85828 Personal history of other malignant neoplasm of skin: Secondary | ICD-10-CM | POA: Diagnosis not present

## 2016-04-15 DIAGNOSIS — D225 Melanocytic nevi of trunk: Secondary | ICD-10-CM | POA: Diagnosis not present

## 2016-04-15 DIAGNOSIS — L570 Actinic keratosis: Secondary | ICD-10-CM | POA: Diagnosis not present

## 2016-04-17 DIAGNOSIS — R69 Illness, unspecified: Secondary | ICD-10-CM | POA: Diagnosis not present

## 2016-05-06 DIAGNOSIS — K921 Melena: Secondary | ICD-10-CM | POA: Diagnosis not present

## 2016-05-21 DIAGNOSIS — R69 Illness, unspecified: Secondary | ICD-10-CM | POA: Diagnosis not present

## 2016-05-23 ENCOUNTER — Other Ambulatory Visit: Payer: Self-pay | Admitting: Gastroenterology

## 2016-05-24 ENCOUNTER — Other Ambulatory Visit: Payer: Self-pay | Admitting: Interventional Cardiology

## 2016-05-24 MED ORDER — AMLODIPINE BESYLATE 2.5 MG PO TABS: ORAL_TABLET | ORAL | 0 refills | Status: DC

## 2016-07-09 ENCOUNTER — Encounter (HOSPITAL_COMMUNITY): Payer: Self-pay | Admitting: *Deleted

## 2016-07-10 ENCOUNTER — Encounter (HOSPITAL_COMMUNITY)
Admission: RE | Disposition: A | Discharge status: Home / Self Care | Payer: Self-pay | Source: Ambulatory Visit | Attending: Gastroenterology

## 2016-07-10 ENCOUNTER — Encounter (HOSPITAL_COMMUNITY): Payer: Self-pay | Admitting: *Deleted

## 2016-07-10 ENCOUNTER — Ambulatory Visit (HOSPITAL_COMMUNITY)
Admission: RE | Admit: 2016-07-10 | Discharge: 2016-07-10 | Disposition: A | Discharge status: Home / Self Care | Payer: Medicare HMO | Source: Ambulatory Visit | Attending: Gastroenterology | Admitting: Gastroenterology

## 2016-07-10 ENCOUNTER — Ambulatory Visit (HOSPITAL_COMMUNITY): Payer: Medicare HMO | Admitting: Anesthesiology

## 2016-07-10 DIAGNOSIS — D125 Benign neoplasm of sigmoid colon: Secondary | ICD-10-CM | POA: Diagnosis not present

## 2016-07-10 DIAGNOSIS — Z87891 Personal history of nicotine dependence: Secondary | ICD-10-CM | POA: Insufficient documentation

## 2016-07-10 DIAGNOSIS — D124 Benign neoplasm of descending colon: Secondary | ICD-10-CM | POA: Diagnosis not present

## 2016-07-10 DIAGNOSIS — Z1211 Encounter for screening for malignant neoplasm of colon: Secondary | ICD-10-CM | POA: Insufficient documentation

## 2016-07-10 DIAGNOSIS — D123 Benign neoplasm of transverse colon: Secondary | ICD-10-CM | POA: Insufficient documentation

## 2016-07-10 DIAGNOSIS — Z79899 Other long term (current) drug therapy: Secondary | ICD-10-CM | POA: Diagnosis not present

## 2016-07-10 DIAGNOSIS — K635 Polyp of colon: Secondary | ICD-10-CM | POA: Insufficient documentation

## 2016-07-10 DIAGNOSIS — I10 Essential (primary) hypertension: Secondary | ICD-10-CM | POA: Diagnosis not present

## 2016-07-10 DIAGNOSIS — K5520 Angiodysplasia of colon without hemorrhage: Secondary | ICD-10-CM | POA: Insufficient documentation

## 2016-07-10 DIAGNOSIS — K5730 Diverticulosis of large intestine without perforation or abscess without bleeding: Secondary | ICD-10-CM | POA: Insufficient documentation

## 2016-07-10 DIAGNOSIS — I4891 Unspecified atrial fibrillation: Secondary | ICD-10-CM | POA: Insufficient documentation

## 2016-07-10 DIAGNOSIS — Z953 Presence of xenogenic heart valve: Secondary | ICD-10-CM | POA: Insufficient documentation

## 2016-07-10 DIAGNOSIS — Z85820 Personal history of malignant melanoma of skin: Secondary | ICD-10-CM | POA: Diagnosis not present

## 2016-07-10 DIAGNOSIS — Z85828 Personal history of other malignant neoplasm of skin: Secondary | ICD-10-CM | POA: Diagnosis not present

## 2016-07-10 DIAGNOSIS — Z7982 Long term (current) use of aspirin: Secondary | ICD-10-CM | POA: Insufficient documentation

## 2016-07-10 DIAGNOSIS — I7300 Raynaud's syndrome without gangrene: Secondary | ICD-10-CM | POA: Diagnosis not present

## 2016-07-10 HISTORY — DX: Lesion of sciatic nerve, left lower limb: G57.02

## 2016-07-10 HISTORY — PX: COLONOSCOPY WITH PROPOFOL: SHX5780

## 2016-07-10 SURGERY — COLONOSCOPY WITH PROPOFOL
Anesthesia: Monitor Anesthesia Care

## 2016-07-10 MED ORDER — PROPOFOL 10 MG/ML IV BOLUS
INTRAVENOUS | Status: DC | PRN
  Administered 2016-07-10 (×5): 20 mg via INTRAVENOUS
  Administered 2016-07-10: 40 mg via INTRAVENOUS
  Administered 2016-07-10: 20 mg via INTRAVENOUS
  Administered 2016-07-10: 40 mg via INTRAVENOUS
  Administered 2016-07-10 (×4): 20 mg via INTRAVENOUS

## 2016-07-10 MED ORDER — PROPOFOL 10 MG/ML IV BOLUS
INTRAVENOUS | Status: AC
  Filled 2016-07-10: qty 20

## 2016-07-10 MED ORDER — LACTATED RINGERS IV SOLN
INTRAVENOUS | Status: DC
  Administered 2016-07-10: 1000 mL via INTRAVENOUS

## 2016-07-10 MED ORDER — SODIUM CHLORIDE 0.9 % IV SOLN: INTRAVENOUS | Status: DC

## 2016-07-10 MED ORDER — PROPOFOL 10 MG/ML IV BOLUS
INTRAVENOUS | Status: AC
  Filled 2016-07-10: qty 40

## 2016-07-10 MED ORDER — LIDOCAINE 2% (20 MG/ML) 5 ML SYRINGE
INTRAMUSCULAR | Status: AC
  Filled 2016-07-10: qty 5

## 2016-07-10 SURGICAL SUPPLY — 22 items

## 2016-07-10 NOTE — H&P (Signed)
Procedure: Screening colonoscopy. Normal screening colonoscopies were performed in 2003 and 2009  History: The patient is a 66 year old female born [birth year]. A few months ago, the patient had a few episodes of painless hematochezia manifested by the passage of fresh blood turning the toilet water red. The bleeding spontaneously resolved and has not recurred. Her hemoglobin was 15 g.  She is scheduled to undergo a repeat screening colonoscopy today. There is no family history of colon cancer.  Past medical history: Hypertension. Melanoma surgery. Bioprosthetic aortic valve replacement. Squamous cell skin cancers. Atrial fibrillation. Hysterectomy. Appendectomy. Tonsillectomy. Bilateral ovarian ectomy. Cataract surgery. Hammertoe surgery.  Exam: The patient is alert and lying comfortably on the endoscopy stretcher. Abdomen is soft and nontender to palpation. Lungs are clear to auscultation. Cardiac exam reveals a regular rhythm.  Plan: Proceed with screening colonoscopy

## 2016-07-10 NOTE — Transfer of Care (Signed)
Immediate Anesthesia Transfer of Care Note  Patient: [name]  Procedure(s) Performed: Procedure(s): COLONOSCOPY WITH PROPOFOL (N/A)  Patient Location: PACU and Endoscopy Unit  Anesthesia Type:MAC  Level of Consciousness: awake, oriented, patient cooperative, lethargic and responds to stimulation  Airway & Oxygen Therapy: Patient Spontanous Breathing and Patient connected to face mask oxygen  Post-op Assessment: Report given to RN, Post -op Vital signs reviewed and stable and Patient moving all extremities  Post vital signs: Reviewed and stable  Last Vitals:  Vitals:   07/10/16 0805  BP: 110/74  Resp: 18  Temp: 36.7 C    Last Pain:  Vitals:   07/10/16 0805  TempSrc: Oral         Complications: No apparent anesthesia complications

## 2016-07-10 NOTE — Op Note (Signed)
[facility] Patient Name: [name] Procedure Date: 07/10/2016 MRN: [id number] Attending MD: Garlan Fair , MD Date of Birth: May 16, 1950 CSN: [id number] Age: 66 Admit Type: Outpatient Procedure:                Colonoscopy Indications:              Screening for colorectal malignant neoplasm Providers:                Garlan Fair, MD, Cleda Daub, RN, Cherylynn Ridges, Technician, Dion Saucier, CRNA Referring MD:              Medicines:                Propofol per Anesthesia Complications:            No immediate complications. Estimated Blood Loss:     Estimated blood loss was minimal. Procedure:                Pre-Anesthesia Assessment:                           - Prior to the procedure, a History and Physical                            was performed, and patient medications and                            allergies were reviewed. The patient's tolerance of                            previous anesthesia was also reviewed. The risks                            and benefits of the procedure and the sedation                            options and risks were discussed with the patient.                            All questions were answered, and informed consent                            was obtained. Prior Anticoagulants: The patient has                            taken aspirin, last dose was day of procedure. ASA                            Grade Assessment: II - A patient with mild systemic                            disease. After reviewing the risks and benefits,  the patient was deemed in satisfactory condition to                            undergo the procedure.                           After obtaining informed consent, the colonoscope                            was passed under direct vision. Throughout the                            procedure, the patient's blood pressure, pulse, and           oxygen saturations were monitored continuously. The                            EC-[id number] ([id number]) scope was introduced through                            the anus and advanced to the the cecum, identified                            by appendiceal orifice and ileocecal valve. The                            colonoscopy was performed without difficulty. The                            patient tolerated the procedure well. The quality                            of the bowel preparation was adequate. The                            appendiceal orifice and the rectum were                            photographed. Scope In: 9:17:36 AM Scope Out: 9:50:35 AM Scope Withdrawal Time: 0 hours 25 minutes 51 seconds  Total Procedure Duration: 0 hours 32 minutes 59 seconds  Findings:      The perianal and digital rectal examinations were normal.      A 3 mm polyp was found in the transverse colon. The polyp was sessile.       The polyp was removed with a cold biopsy forceps. Resection and       retrieval were complete.      A 5 mm polyp was found in the sigmoid colon. The polyp was sessile. The       polyp was removed with a cold snare. Resection and retrieval were       complete.      Two sessile polyps were found in the descending colon. The polyps were 3       mm in size. These polyps were removed with a cold biopsy forceps.       Resection and retrieval were complete.  The exam was otherwise without abnormality. Left colonic diverticulosis       was present. A 6 mm proximal ascending colon nonbleeding arteriovenous       malformation was endoclipped with 2 clips. Impression:               - One 3 mm polyp in the transverse colon, removed                            with a cold biopsy forceps. Resected and retrieved.                           - One 5 mm polyp in the sigmoid colon, removed with                            a cold snare. Resected and retrieved.                           - Two  3 mm polyps in the descending colon, removed                            with a cold biopsy forceps. Resected and retrieved.                           - The examination was otherwise normal. Moderate Sedation:      N/A- Per Anesthesia Care Recommendation:           - Patient has a contact number available for                            emergencies. The signs and symptoms of potential                            delayed complications were discussed with the                            patient. Return to normal activities tomorrow.                            Written discharge instructions were provided to the                            patient.                           - Repeat colonoscopy date to be determined after                            pending pathology results are reviewed for                            surveillance.                           - Resume previous diet.                           -  Continue present medications. Procedure Code(s):        --- Professional ---                           [phone], Colonoscopy, flexible; with removal of                            tumor(s), polyp(s), or other lesion(s) by snare                            technique                           45380, 66, Colonoscopy, flexible; with biopsy,                            single or multiple Diagnosis Code(s):        --- Professional ---                           Z12.11, Encounter for screening for malignant                            neoplasm of colon                           D12.3, Benign neoplasm of transverse colon (hepatic                            flexure or splenic flexure)                           D12.5, Benign neoplasm of sigmoid colon                           D12.4, Benign neoplasm of descending colon CPT copyright 2016 American Medical Association. All rights reserved. The codes documented in this report are preliminary and upon coder review may  be revised to meet current compliance  requirements. Earle Gell, MD Garlan Fair, MD 07/10/2016 9:58:26 AM This report has been signed electronically. Number of Addenda: 0

## 2016-07-10 NOTE — Anesthesia Preprocedure Evaluation (Signed)
Anesthesia Evaluation  Patient identified by MRN, date of birth, ID band Patient awake    Reviewed: Allergy & Precautions, NPO status , Patient's Chart, lab work & pertinent test results  Airway Mallampati: I       Dental no notable dental hx.    Pulmonary former smoker,    breath sounds clear to auscultation       Cardiovascular hypertension, Pt. on medications and Pt. on home beta blockers  Rhythm:Regular Rate:Normal + Systolic murmurs    Neuro/Psych negative psych ROS   GI/Hepatic negative GI ROS, Neg liver ROS,   Endo/Other  negative endocrine ROS  Renal/GU negative Renal ROS     Musculoskeletal negative musculoskeletal ROS (+)   Abdominal Normal abdominal exam  (+)   Peds  Hematology negative hematology ROS (+)   Anesthesia Other Findings 2D Echocardiogram without contrast  Order# [id number]  Ordering physician: Belva Crome, MD Study date: 07/02/13 Result Notes   Notes Recorded by Lamar Laundry, CMA on 07/06/2013 at 10:28 AM pt given echo results.Very good study with normal Aortic valve function.pt verbalized understanding. ------  Notes Recorded by Sinclair Grooms, MD on 07/05/2013 at 2:13 PM Very good study with normal Aortic valve function   Study Result   Result status: Final result             [facility]            [address], [place] [address]              [phone]  ------------------------------------------------------------------- Transthoracic Echocardiography  Patient:  [name], [name] MR #:    [id number] Study Date: 07/02/2013 Gender:   F Age:    66 Height:   157.5 cm Weight:   63.1 kg BSA:    1.67 m^2 Pt. Status: Room:  ATTENDING  Rayford Halsted   Sinclair Grooms REFERRING  Sinclair Grooms SONOGRAPHER [facility] PERFORMING   [facility], Outpatient  cc:  ------------------------------------------------------------------- LV EF: 55% -  60%  ------------------------------------------------------------------- Indications:   Aortic stenosis 424.1.  ------------------------------------------------------------------- History:  PMH: Acquired from the patient and from the patient&'s chart. Aortic valve disease. Risk factors: Hypertension.  ------------------------------------------------------------------- Study Conclusions  - Left ventricle: The cavity size was normal. There was mild focal basal hypertrophy of the septum. Systolic function was normal. The estimated ejection fraction was in the range of 55% to 60%. Wall motion was normal; there were no regional wall motion abnormalities. Doppler parameters are consistent with abnormal left ventricular relaxation (grade 1 diastolic dysfunction). - Aortic valve: A bioprosthesis was present. There was trivial regurgitation. - Tricuspid valve: There was moderate regurgitation. - Pulmonary arteries: Systolic pressure was mildly increased. PA peak pressure: 35 mm Hg (S).    Reproductive/Obstetrics                             Anesthesia Physical Anesthesia Plan  ASA: II  Anesthesia Plan: MAC   Post-op Pain Management:    Induction:   PONV Risk Score and Plan: 2 and Ondansetron and Dexamethasone  Airway Management Planned: Simple Face Mask, Natural Airway and Nasal Cannula  Additional Equipment:   Intra-op Plan:   Post-operative Plan:   Informed Consent: I have reviewed the patients History and Physical, chart, labs and discussed the procedure including the risks, benefits and alternatives  for the proposed anesthesia with the patient or authorized representative who has indicated his/her understanding and acceptance.     Plan Discussed with: CRNA and Surgeon  Anesthesia Plan Comments:          Anesthesia Quick Evaluation

## 2016-07-10 NOTE — Discharge Instructions (Signed)

## 2016-07-10 NOTE — Anesthesia Postprocedure Evaluation (Signed)
Anesthesia Post Note  Patient: [name]  Procedure(s) Performed: Procedure(s) (LRB): COLONOSCOPY WITH PROPOFOL (N/A)     Patient location during evaluation: PACU Anesthesia Type: MAC Level of consciousness: awake Pain management: pain level controlled Vital Signs Assessment: post-procedure vital signs reviewed and stable Respiratory status: spontaneous breathing Cardiovascular status: stable Postop Assessment: no signs of nausea or vomiting Anesthetic complications: no    Last Vitals:  Vitals:   07/10/16 0805 07/10/16 1000  BP: 110/74 94/75  Pulse:  70  Resp: 18 17  Temp: 36.7 C     Last Pain:  Vitals:   07/10/16 0805  TempSrc: Oral   Pain Goal:                 [name],[name]

## 2016-07-11 ENCOUNTER — Ambulatory Visit: Payer: Medicare HMO | Admitting: Interventional Cardiology

## 2016-07-11 ENCOUNTER — Encounter (HOSPITAL_COMMUNITY): Payer: Self-pay | Admitting: Gastroenterology

## 2016-07-17 ENCOUNTER — Encounter: Payer: Self-pay | Admitting: Interventional Cardiology

## 2016-07-28 NOTE — Progress Notes (Signed)
Cardiology Office Note    Date:  07/29/2016   ID:  [name], DOB May 31, 1950, MRN [id number]  PCP:  Lavone Orn, MD  Cardiologist: Sinclair Grooms, MD   Chief Complaint  Patient presents with  . Cardiac Valve Problem    Bioprosthetic aortic valve, 2011    History of Present Illness:  [name] is a 66 y.o. female  follow-up of aortic valve replacement 2011( tissue) , and hypertension.  She has no complaints. No recent difficulty with her blood pressure. No medication side effects. She occasionally has trace ankle edema. Denies orthopnea and PND.  Past Medical History:  Diagnosis Date  . Hyperplastic colon polyp 2009  . Hypertension   . Melanoma ([place]) 5-6 yrs ago  . Raynaud's disease   . S/P AVR   . Sciatic nerve disease, left     Past Surgical History:  Procedure Laterality Date  . ABDOMINAL HYSTERECTOMY  yrs ago  . BILATERAL OOPHORECTOMY  2008  . CARDIAC VALVE REPLACEMENT  7 yrs ago   aortic bovine valeve replave replacement   . COLONOSCOPY WITH PROPOFOL N/A 07/10/2016   Procedure: COLONOSCOPY WITH PROPOFOL;  Surgeon: Garlan Fair, MD;  Location: [facility] ENDOSCOPY;  Service: Endoscopy;  Laterality: N/A;  . EYE SURGERY Bilateral    ioc for cataracts    Current Medications: Outpatient Medications Prior to Visit  Medication Sig Dispense Refill  . amLODipine (NORVASC) 2.5 MG tablet TAKE 1 TABLET(2.5 MG) BY MOUTH DAILY 90 tablet 0  . aspirin EC 81 MG tablet Take 81 mg by mouth daily.    . cephALEXin (KEFLEX) 500 MG capsule Take 2,000 mg by mouth as needed (Prior to [place]).     . cholecalciferol (VITAMIN D) 1000 UNITS tablet Take 1,000 Units by mouth daily.    . fluticasone (FLONASE) 50 MCG/ACT nasal spray Place 2 sprays into the nose daily. Only during allergy season    . losartan-hydrochlorothiazide (HYZAAR) 100-25 MG tablet Take 1 tablet by mouth daily. 30 tablet 11  . metoprolol succinate (TOPROL-XL) 25 MG 24 hr tablet TAKE 1  TABLET BY MOUTH EVERY DAY 30 tablet 8   No facility-administered medications prior to visit.      Allergies:   Ace inhibitors; Sulfa antibiotics; and Amlodipine   Social History   Social History  . Marital status: Widowed    Spouse name: N/A  . Number of children: N/A  . Years of education: N/A   Social History Main Topics  . Smoking status: Former Smoker    Packs/day: 1.00    Years: 30.00    Types: Cigarettes  . Smokeless tobacco: Never Used     Comment: quit 13 to 15 yrs ago  . Alcohol use 0.0 oz/week     Comment: 1 glass wine per day  . Drug use: No  . Sexual activity: Not Asked   Other Topics Concern  . None   Social History Narrative  . None     Family History:  The patient's family history includes Breast cancer in her mother; Cancer in her father; Healthy in her sister; Heart failure in her mother; Hypertension in her brother, father, sister, and sister; Osteoporosis in her sister.   ROS:   Please see the history of present illness.    There is some difficulty with constipation but otherwise no complaints.  All other systems reviewed and are negative.   PHYSICAL EXAM:   VS:  BP 120/76 (BP Location: Left  Arm)   Pulse 71   Ht 5' 3.5" (1.613 m)   Wt 149 lb (67.6 kg)   BMI 25.98 kg/m    GEN: Well nourished, well developed, in no acute distress  HEENT: normal  Neck: no JVD, carotid bruits, or masses Cardiac: RRR, rubs, or gallops,no edema. There is a 2/6 systolic right upper sternal borders murmur compatible with flow through her known tissue prosthesis.  Respiratory:  clear to auscultation bilaterally, normal work of breathing GI: soft, nontender, nondistended, + BS MS: no deformity or atrophy  Skin: warm and dry, no rash Neuro:  Alert and Oriented x 3, Strength and sensation are intact Psych: euthymic mood, full affect  Wt Readings from Last 3 Encounters:  07/29/16 149 lb (67.6 kg)  07/10/16 145 lb (65.8 kg)  06/29/15 145 lb 6.4 oz (66 kg)       Studies/Labs Reviewed:   EKG:  EKG  Normal sinus rhythm with left axis deviation, poor R-wave progression, and incomplete right bundle branch block. No change compared to prior.  Recent Labs: 10/20/2015: BUN 16; Creat 0.74; Potassium 4.1; Sodium 142   Lipid Panel No results found for: CHOL, TRIG, HDL, CHOLHDL, VLDL, LDLCALC, LDLDIRECT  Additional studies/ records that were reviewed today include:  No recent functional data.    ASSESSMENT:    1. History of aortic valve replacement with bioprosthetic valve   2. Raynaud's phenomenon without gangrene   3. Essential hypertension      PLAN:  In order of problems listed above:  1. By auscultation and based upon clinical and physical exam parameters, she is doing well without evidence of valve dysfunction. We'll plan a surveillance echocardiogram prior to next office visit one year from now. 2. Not addressed 3. Excellent blood pressure control  Clinical follow-up in one year. Discussed results of the echocardiogram that will be done next year at that time. Encourage aerobic activity. Weight control and more plantar-based diet.  Medication Adjustments/Labs and Tests Ordered: Current medicines are reviewed at length with the patient today.  Concerns regarding medicines are outlined above.  Medication changes, Labs and Tests ordered today are listed in the Patient Instructions below. There are no Patient Instructions on file for this visit.   Signed, Sinclair Grooms, MD  07/29/2016 1:55 PM    [facility] [place], [place], [place]  [address] Phone: [phone]; Fax: [phone]

## 2016-07-29 ENCOUNTER — Encounter: Payer: Self-pay | Admitting: Interventional Cardiology

## 2016-07-29 ENCOUNTER — Ambulatory Visit (INDEPENDENT_AMBULATORY_CARE_PROVIDER_SITE_OTHER): Payer: Medicare HMO | Admitting: Interventional Cardiology

## 2016-07-29 VITALS — BP 120/76 | HR 71 | Ht 63.5 in | Wt 149.0 lb

## 2016-07-29 DIAGNOSIS — I7300 Raynaud's syndrome without gangrene: Secondary | ICD-10-CM

## 2016-07-29 DIAGNOSIS — I10 Essential (primary) hypertension: Secondary | ICD-10-CM

## 2016-07-29 DIAGNOSIS — Z953 Presence of xenogenic heart valve: Secondary | ICD-10-CM | POA: Diagnosis not present

## 2016-07-29 NOTE — Patient Instructions (Signed)
Medication Instructions:  Your physician recommends that you continue on your current medications as directed. Please refer to the Current Medication list given to you today.   Labwork: None ordered  Testing/Procedures: Your physician has requested that you have an echocardiogram completed prior to annual follow-up. Echocardiography is a painless test that uses sound waves to create images of your heart. It provides your doctor with information about the size and shape of your heart and how well your heart's chambers and valves are working. This procedure takes approximately one hour. There are no restrictions for this procedure.    Follow-Up: Your physician wants you to follow-up in 1 year with Dr. Tamala Julian. You will receive a reminder letter in the mail two months in advance. If you don't receive a letter, please call our office to schedule the follow-up appointment.   Any Other Special Instructions Will Be Listed Below (If Applicable).     If you need a refill on your cardiac medications before your next appointment, please call your pharmacy.

## 2016-08-02 ENCOUNTER — Ambulatory Visit: Payer: Medicare HMO | Admitting: Interventional Cardiology

## 2016-08-19 ENCOUNTER — Other Ambulatory Visit: Payer: Self-pay | Admitting: Interventional Cardiology

## 2016-08-19 DIAGNOSIS — L570 Actinic keratosis: Secondary | ICD-10-CM | POA: Diagnosis not present

## 2016-08-19 DIAGNOSIS — L821 Other seborrheic keratosis: Secondary | ICD-10-CM | POA: Diagnosis not present

## 2016-08-19 DIAGNOSIS — D1801 Hemangioma of skin and subcutaneous tissue: Secondary | ICD-10-CM | POA: Diagnosis not present

## 2016-08-19 DIAGNOSIS — Z85828 Personal history of other malignant neoplasm of skin: Secondary | ICD-10-CM | POA: Diagnosis not present

## 2016-08-19 DIAGNOSIS — Z85820 Personal history of malignant melanoma of skin: Secondary | ICD-10-CM | POA: Diagnosis not present

## 2016-08-28 ENCOUNTER — Other Ambulatory Visit: Payer: Self-pay | Admitting: Interventional Cardiology

## 2016-09-21 ENCOUNTER — Other Ambulatory Visit: Payer: Self-pay | Admitting: Interventional Cardiology

## 2016-09-23 DIAGNOSIS — R69 Illness, unspecified: Secondary | ICD-10-CM | POA: Diagnosis not present

## 2016-11-05 DIAGNOSIS — Z85828 Personal history of other malignant neoplasm of skin: Secondary | ICD-10-CM | POA: Diagnosis not present

## 2016-11-05 DIAGNOSIS — L570 Actinic keratosis: Secondary | ICD-10-CM | POA: Diagnosis not present

## 2016-11-05 DIAGNOSIS — D485 Neoplasm of uncertain behavior of skin: Secondary | ICD-10-CM | POA: Diagnosis not present

## 2016-11-05 DIAGNOSIS — L821 Other seborrheic keratosis: Secondary | ICD-10-CM | POA: Diagnosis not present

## 2016-11-05 DIAGNOSIS — D1801 Hemangioma of skin and subcutaneous tissue: Secondary | ICD-10-CM | POA: Diagnosis not present

## 2016-11-05 DIAGNOSIS — Z85820 Personal history of malignant melanoma of skin: Secondary | ICD-10-CM | POA: Diagnosis not present

## 2016-11-05 DIAGNOSIS — D0472 Carcinoma in situ of skin of left lower limb, including hip: Secondary | ICD-10-CM | POA: Diagnosis not present

## 2016-11-18 DIAGNOSIS — Z85828 Personal history of other malignant neoplasm of skin: Secondary | ICD-10-CM | POA: Diagnosis not present

## 2016-11-18 DIAGNOSIS — L438 Other lichen planus: Secondary | ICD-10-CM | POA: Diagnosis not present

## 2016-12-10 ENCOUNTER — Encounter: Payer: Self-pay | Admitting: Interventional Cardiology

## 2016-12-11 ENCOUNTER — Encounter: Payer: Self-pay | Admitting: Interventional Cardiology

## 2016-12-27 ENCOUNTER — Other Ambulatory Visit: Payer: Self-pay | Admitting: Internal Medicine

## 2016-12-27 DIAGNOSIS — Z1231 Encounter for screening mammogram for malignant neoplasm of breast: Secondary | ICD-10-CM

## 2017-01-29 DIAGNOSIS — R69 Illness, unspecified: Secondary | ICD-10-CM | POA: Diagnosis not present

## 2017-02-07 DIAGNOSIS — Z1159 Encounter for screening for other viral diseases: Secondary | ICD-10-CM | POA: Diagnosis not present

## 2017-02-07 DIAGNOSIS — Z1389 Encounter for screening for other disorder: Secondary | ICD-10-CM | POA: Diagnosis not present

## 2017-02-07 DIAGNOSIS — I10 Essential (primary) hypertension: Secondary | ICD-10-CM | POA: Diagnosis not present

## 2017-02-07 DIAGNOSIS — Z0000 Encounter for general adult medical examination without abnormal findings: Secondary | ICD-10-CM | POA: Diagnosis not present

## 2017-02-10 ENCOUNTER — Ambulatory Visit
Admission: RE | Admit: 2017-02-10 | Discharge: 2017-02-10 | Disposition: A | Discharge status: Home / Self Care | Payer: Medicare HMO | Source: Ambulatory Visit | Attending: Internal Medicine | Admitting: Internal Medicine

## 2017-02-10 DIAGNOSIS — Z1231 Encounter for screening mammogram for malignant neoplasm of breast: Secondary | ICD-10-CM

## 2017-02-11 DIAGNOSIS — Z85828 Personal history of other malignant neoplasm of skin: Secondary | ICD-10-CM | POA: Diagnosis not present

## 2017-02-11 DIAGNOSIS — L570 Actinic keratosis: Secondary | ICD-10-CM | POA: Diagnosis not present

## 2017-02-11 DIAGNOSIS — Z85820 Personal history of malignant melanoma of skin: Secondary | ICD-10-CM | POA: Diagnosis not present

## 2017-02-11 DIAGNOSIS — L821 Other seborrheic keratosis: Secondary | ICD-10-CM | POA: Diagnosis not present

## 2017-02-21 DIAGNOSIS — Z961 Presence of intraocular lens: Secondary | ICD-10-CM | POA: Diagnosis not present

## 2017-05-30 ENCOUNTER — Other Ambulatory Visit: Payer: Self-pay | Admitting: Interventional Cardiology

## 2017-06-02 DIAGNOSIS — R69 Illness, unspecified: Secondary | ICD-10-CM | POA: Diagnosis not present

## 2017-06-17 DIAGNOSIS — D0472 Carcinoma in situ of skin of left lower limb, including hip: Secondary | ICD-10-CM | POA: Diagnosis not present

## 2017-06-17 DIAGNOSIS — D485 Neoplasm of uncertain behavior of skin: Secondary | ICD-10-CM | POA: Diagnosis not present

## 2017-06-17 DIAGNOSIS — L821 Other seborrheic keratosis: Secondary | ICD-10-CM | POA: Diagnosis not present

## 2017-06-17 DIAGNOSIS — Z85820 Personal history of malignant melanoma of skin: Secondary | ICD-10-CM | POA: Diagnosis not present

## 2017-06-17 DIAGNOSIS — Z85828 Personal history of other malignant neoplasm of skin: Secondary | ICD-10-CM | POA: Diagnosis not present

## 2017-06-17 DIAGNOSIS — L570 Actinic keratosis: Secondary | ICD-10-CM | POA: Diagnosis not present

## 2017-06-17 DIAGNOSIS — D0361 Melanoma in situ of right upper limb, including shoulder: Secondary | ICD-10-CM | POA: Diagnosis not present

## 2017-06-17 DIAGNOSIS — D0471 Carcinoma in situ of skin of right lower limb, including hip: Secondary | ICD-10-CM | POA: Diagnosis not present

## 2017-06-30 DIAGNOSIS — D0361 Melanoma in situ of right upper limb, including shoulder: Secondary | ICD-10-CM | POA: Diagnosis not present

## 2017-06-30 DIAGNOSIS — Z85828 Personal history of other malignant neoplasm of skin: Secondary | ICD-10-CM | POA: Diagnosis not present

## 2017-07-17 ENCOUNTER — Encounter: Payer: Self-pay | Admitting: Interventional Cardiology

## 2017-08-04 ENCOUNTER — Other Ambulatory Visit: Payer: Self-pay | Admitting: Interventional Cardiology

## 2017-08-13 ENCOUNTER — Other Ambulatory Visit: Payer: Self-pay | Admitting: Interventional Cardiology

## 2017-08-26 DIAGNOSIS — K922 Gastrointestinal hemorrhage, unspecified: Secondary | ICD-10-CM | POA: Diagnosis not present

## 2017-08-26 DIAGNOSIS — K625 Hemorrhage of anus and rectum: Secondary | ICD-10-CM | POA: Diagnosis not present

## 2017-08-26 DIAGNOSIS — K921 Melena: Secondary | ICD-10-CM | POA: Diagnosis not present

## 2017-08-26 DIAGNOSIS — D500 Iron deficiency anemia secondary to blood loss (chronic): Secondary | ICD-10-CM | POA: Diagnosis not present

## 2017-08-26 DIAGNOSIS — D62 Acute posthemorrhagic anemia: Secondary | ICD-10-CM | POA: Diagnosis not present

## 2017-08-26 DIAGNOSIS — I10 Essential (primary) hypertension: Secondary | ICD-10-CM | POA: Diagnosis not present

## 2017-08-26 DIAGNOSIS — I350 Nonrheumatic aortic (valve) stenosis: Secondary | ICD-10-CM | POA: Diagnosis not present

## 2017-08-26 DIAGNOSIS — R531 Weakness: Secondary | ICD-10-CM | POA: Diagnosis not present

## 2017-08-26 DIAGNOSIS — Z87891 Personal history of nicotine dependence: Secondary | ICD-10-CM | POA: Diagnosis not present

## 2017-08-26 DIAGNOSIS — D649 Anemia, unspecified: Secondary | ICD-10-CM | POA: Diagnosis not present

## 2017-08-26 DIAGNOSIS — D509 Iron deficiency anemia, unspecified: Secondary | ICD-10-CM | POA: Diagnosis not present

## 2017-08-27 DIAGNOSIS — D649 Anemia, unspecified: Secondary | ICD-10-CM | POA: Diagnosis not present

## 2017-08-27 DIAGNOSIS — K625 Hemorrhage of anus and rectum: Secondary | ICD-10-CM | POA: Diagnosis not present

## 2017-08-28 DIAGNOSIS — D649 Anemia, unspecified: Secondary | ICD-10-CM | POA: Diagnosis not present

## 2017-08-29 ENCOUNTER — Encounter: Payer: Self-pay | Admitting: Interventional Cardiology

## 2017-09-01 DIAGNOSIS — D509 Iron deficiency anemia, unspecified: Secondary | ICD-10-CM | POA: Diagnosis not present

## 2017-09-02 ENCOUNTER — Telehealth: Payer: Self-pay | Admitting: Interventional Cardiology

## 2017-09-02 NOTE — Telephone Encounter (Signed)
° °  [facility] Pre-operative Risk Assessment    Request for surgical clearance:  1. What type of surgery is being performed? EGD   2. When is this surgery scheduled? 10/02/2017  3. What type of clearance is required (medical clearance vs. Pharmacy clearance to hold med vs. Both)? Medical clearance   4. Are there any medications that need to be held prior to surgery and how long? Advise if there is any medications that need to be held and how long.  5. Practice name and name of physician performing surgery? [facility], Dr. Enrigue Catena  6. What is your office phone number [phone] ATTN: Nira Conn   7.   What is your office fax number [phone]  8.   Anesthesia type (None, local, MAC, general) ? Not listed    [name] 09/02/2017, 10:02 AM  _________________________________________________________________   (provider comments below)

## 2017-09-08 DIAGNOSIS — D649 Anemia, unspecified: Secondary | ICD-10-CM | POA: Diagnosis not present

## 2017-09-08 NOTE — Telephone Encounter (Signed)
Patient has upcoming appointment with Dr. Tamala Julian.   Surgical clearance can be addressed at that time.   Burtis Junes, RN, [place] [address] [place], [place]  [address] [phone]

## 2017-09-15 ENCOUNTER — Ambulatory Visit (HOSPITAL_COMMUNITY): Payer: Medicare HMO | Attending: Cardiology

## 2017-09-15 ENCOUNTER — Other Ambulatory Visit: Payer: Self-pay

## 2017-09-15 DIAGNOSIS — Z09 Encounter for follow-up examination after completed treatment for conditions other than malignant neoplasm: Secondary | ICD-10-CM | POA: Diagnosis not present

## 2017-09-15 DIAGNOSIS — R69 Illness, unspecified: Secondary | ICD-10-CM | POA: Diagnosis not present

## 2017-09-15 DIAGNOSIS — I10 Essential (primary) hypertension: Secondary | ICD-10-CM | POA: Insufficient documentation

## 2017-09-15 DIAGNOSIS — I071 Rheumatic tricuspid insufficiency: Secondary | ICD-10-CM | POA: Diagnosis not present

## 2017-09-15 DIAGNOSIS — Z953 Presence of xenogenic heart valve: Secondary | ICD-10-CM | POA: Diagnosis not present

## 2017-09-15 NOTE — Progress Notes (Signed)
Cardiology Office Note:    Date:  09/16/2017   ID:  JANIS SOL, DOB 04-13-1950, MRN [id number]  PCP:  Lavone Orn, MD  Cardiologist:  No primary care provider on file.   Referring MD: Lavone Orn, MD   Chief Complaint  Patient presents with  . Cardiac Valve Problem    History of Present Illness:    [name] is a 67 y.o. female with a hx of aortic valve replacement 2011( tissue), and hypertension.  Overall she is doing relatively well.  No specific cardiac complaints.  Last seen in July 2018.  Echo done August 2019.  Please see summary of echo below.  She is doing well.  She has not had syncope, angina, orthopnea, PND, but has had some lower extremity swelling.  She gets lower extremity swelling when she goes on long trips including flying.  She flew from [place] here for this office visit.  In the interval since I last saw her she had a significant lower GI bleed.  She had received 2 units of blood because her hemoglobin is dropped to 7.2.  Most recent hemoglobin done within the past week is 13.2.  She feels back to baseline.  She has had no recurrent bleeding.  She has not had palpitations or  Past Medical History:  Diagnosis Date  . Hyperplastic colon polyp 2009  . Hypertension   . Melanoma ([place]) 5-6 yrs ago  . Raynaud's disease   . S/P AVR   . Sciatic nerve disease, left     Past Surgical History:  Procedure Laterality Date  . ABDOMINAL HYSTERECTOMY  yrs ago  . BILATERAL OOPHORECTOMY  2008  . BREAST EXCISIONAL BIOPSY Right   . CARDIAC VALVE REPLACEMENT  7 yrs ago   aortic bovine valeve replave replacement   . COLONOSCOPY WITH PROPOFOL N/A 07/10/2016   Procedure: COLONOSCOPY WITH PROPOFOL;  Surgeon: Garlan Fair, MD;  Location: [facility];  Service: Endoscopy;  Laterality: N/A;  . EYE SURGERY Bilateral    ioc for cataracts    Current Medications: Current Meds  Medication Sig  . amLODipine (NORVASC) 2.5 MG tablet Take 1 tablet (2.5 mg total) by  mouth daily. Please keep upcoming appt for future refills. Thank you  . cephALEXin (KEFLEX) 500 MG capsule Take 2,000 mg by mouth as needed (Prior to [place]).   . cholecalciferol (VITAMIN D) 1000 UNITS tablet Take 1,000 Units by mouth daily.  . fluticasone (FLONASE) 50 MCG/ACT nasal spray Place 2 sprays into the nose daily. Only during allergy season  . IRON PO Take 1 tablet by mouth 2 (two) times daily.  Marland Kitchen losartan-hydrochlorothiazide (HYZAAR) 100-25 MG tablet Take 1 tablet by mouth daily. Please keep upcoming appt for future refills. Thank you  . metoprolol succinate (TOPROL-XL) 25 MG 24 hr tablet TAKE 1 TABLET BY MOUTH EVERY DAY  . pantoprazole (PROTONIX) 40 MG tablet Take 40 mg by mouth daily.     Allergies:   Ace inhibitors; Sulfa antibiotics; and Amlodipine   Social History   Socioeconomic History  . Marital status: Widowed    Spouse name: Not on file  . Number of children: Not on file  . Years of education: Not on file  . Highest education level: Not on file  Occupational History  . Not on file  Social Needs  . Financial resource strain: Not on file  . Food insecurity:    Worry: Not on file    Inability: Not on file  .  Transportation needs:    Medical: Not on file    Non-medical: Not on file  Tobacco Use  . Smoking status: Former Smoker    Packs/day: 1.00    Years: 30.00    Pack years: 30.00    Types: Cigarettes  . Smokeless tobacco: Never Used  . Tobacco comment: quit 13 to 15 yrs ago  Substance and Sexual Activity  . Alcohol use: Yes    Alcohol/week: 0.0 standard drinks    Comment: 1 glass wine per day  . Drug use: No  . Sexual activity: Not on file  Lifestyle  . Physical activity:    Days per week: Not on file    Minutes per session: Not on file  . Stress: Not on file  Relationships  . Social connections:    Talks on phone: Not on file    Gets together: Not on file    Attends religious service: Not on file    Active member of  club or organization: Not on file    Attends meetings of clubs or organizations: Not on file    Relationship status: Not on file  Other Topics Concern  . Not on file  Social History Narrative  . Not on file     Family History: The patient's family history includes Breast cancer in her mother; Cancer in her father; Healthy in her sister; Heart failure in her mother; Hypertension in her brother, father, sister, and sister; Osteoporosis in her sister.  ROS:   Please see the history of present illness.    GI bleeding with development of anemia and hemoglobin of 9.3.  Undergoing GI work-up all other systems reviewed and are negative.  EKGs/Labs/Other Studies Reviewed:    The following studies were reviewed today:  Doppler echocardiogram performed 09/15/2017 Study Conclusions  - Left ventricle: The cavity size was normal. Wall thickness was   normal. Systolic function was normal. The estimated ejection   fraction was in the range of 60% to 65%. Wall motion was normal;   there were no regional wall motion abnormalities. Doppler   parameters are consistent with abnormal left ventricular   relaxation (grade 1 diastolic dysfunction). - Aortic valve: A bioprosthesis was present. Valve mobility was   restricted. There was trivial regurgitation. Peak velocity (S):   353 cm/s. Mean gradient (S): 27 mm Hg (prior 58mmHg). - Right atrium: The atrium was mildly dilated. - Tricuspid valve: There was moderate regurgitation.  Compared to the prior tracing the velocity has increased significantly from 250 to 350 m/s when the 2015 echo is compared to 2019.   EKG:  EKG is  ordered today.  The ekg ordered today demonstrates normal sinus rhythm/sinus bradycardia with biatrial abnormality, left axis deviation, QS pattern V1 through V3.  Relatively low voltage.  Compared to the prior tracing of 07/2016, no change has occurred.  Recent Labs: No results found for requested labs within last 8760 hours.    Recent Lipid Panel No results found for: CHOL, TRIG, HDL, CHOLHDL, VLDL, LDLCALC, LDLDIRECT  Physical Exam:    VS:  BP 132/84   Pulse (!) 55   Ht 5' 3.5" (1.613 m)   Wt 147 lb 12.8 oz (67 kg)   BMI 25.77 kg/m     Wt Readings from Last 3 Encounters:  09/16/17 147 lb 12.8 oz (67 kg)  07/29/16 149 lb (67.6 kg)  07/10/16 145 lb (65.8 kg)     GEN:  Well nourished, well developed in no acute distress HEENT:  Normal NECK: No JVD. LYMPHATICS: No lymphadenopathy CARDIAC: RRR, 2/6 right upper sternal border systolic ejection murmur, no gallop, no edema. VASCULAR: 2+ pulses. no bruits. RESPIRATORY:  Clear to auscultation without rales, wheezing or rhonchi  ABDOMEN: Soft, non-tender, non-distended, No pulsatile mass, MUSCULOSKELETAL: No deformity  SKIN: Warm and dry NEUROLOGIC:  Alert and oriented x 3 PSYCHIATRIC:  Normal affect   ASSESSMENT:    1. History of aortic valve replacement with bioprosthetic valve   2. Essential hypertension   3. Preoperative cardiovascular examination   4. Lower GI bleed   5. Raynaud's phenomenon without gangrene    PLAN:    In order of problems listed above:  1. Functionally and clinically stable.  There has been a significant increase in velocity across the bioprosthetic aortic valve when the current echo study is compared to 4 years ago.  We will plan to repeat an echo in 1 year.  We discussed the potential explanation for the increase in velocity including pannus/bioprosthetic leaflet degeneration and stiffening. 2. Blood pressures in excellent control.  Target is 130/80 mmHg or less. 3. The patient is clinically stable and is cleared to proceed with upper and lower GI endoscopy.  No further cardiac evaluation is needed.  She should have appropriate endocarditis prophylaxis given her known bioprosthetic aortic valve. 4. Rule out lower GI bleeding from AV malformation, diverticular bleeding, versus other (Meckel's diverticulum)  Overall, doing  well.  She is in the process of having a recent lower GI bleed evaluated.   Medication Adjustments/Labs and Tests Ordered: Current medicines are reviewed at length with the patient today.  Concerns regarding medicines are outlined above.  Orders Placed This Encounter  Procedures  . EKG 12-Lead  . ECHOCARDIOGRAM COMPLETE   No orders of the defined types were placed in this encounter.   Patient Instructions  Medication Instructions:  Your physician recommends that you continue on your current medications as directed. Please refer to the Current Medication list given to you today.  Labwork: None  Testing/Procedures: Your physician has requested that you have an echocardiogram prior to seeing Dr. Tamala Julian back in August 2020. Echocardiography is a painless test that uses sound waves to create images of your heart. It provides your doctor with information about the size and shape of your heart and how well your heart's chambers and valves are working. This procedure takes approximately one hour. There are no restrictions for this procedure.    Follow-Up: Your physician wants you to follow-up in: August 2020 with Dr. Tamala Julian.  You will receive a reminder letter in the mail two months in advance. If you don't receive a letter, please call our office to schedule the follow-up appointment.   Any Other Special Instructions Will Be Listed Below (If Applicable).     If you need a refill on your cardiac medications before your next appointment, please call your pharmacy.      Signed, Sinclair Grooms, MD  09/16/2017 1:45 PM    [facility]

## 2017-09-16 ENCOUNTER — Encounter: Payer: Self-pay | Admitting: Interventional Cardiology

## 2017-09-16 ENCOUNTER — Ambulatory Visit: Payer: Medicare HMO | Admitting: Interventional Cardiology

## 2017-09-16 ENCOUNTER — Encounter

## 2017-09-16 VITALS — BP 132/84 | HR 55 | Ht 63.5 in | Wt 147.8 lb

## 2017-09-16 DIAGNOSIS — I10 Essential (primary) hypertension: Secondary | ICD-10-CM | POA: Diagnosis not present

## 2017-09-16 DIAGNOSIS — K922 Gastrointestinal hemorrhage, unspecified: Secondary | ICD-10-CM | POA: Diagnosis not present

## 2017-09-16 DIAGNOSIS — I7300 Raynaud's syndrome without gangrene: Secondary | ICD-10-CM | POA: Diagnosis not present

## 2017-09-16 DIAGNOSIS — Z01810 Encounter for preprocedural cardiovascular examination: Secondary | ICD-10-CM

## 2017-09-16 DIAGNOSIS — Z953 Presence of xenogenic heart valve: Secondary | ICD-10-CM

## 2017-09-16 NOTE — Patient Instructions (Signed)
Medication Instructions:  Your physician recommends that you continue on your current medications as directed. Please refer to the Current Medication list given to you today.  Labwork: None  Testing/Procedures: Your physician has requested that you have an echocardiogram prior to seeing Dr. Tamala Julian back in August 2020. Echocardiography is a painless test that uses sound waves to create images of your heart. It provides your doctor with information about the size and shape of your heart and how well your heart's chambers and valves are working. This procedure takes approximately one hour. There are no restrictions for this procedure.    Follow-Up: Your physician wants you to follow-up in: August 2020 with Dr. Tamala Julian.  You will receive a reminder letter in the mail two months in advance. If you don't receive a letter, please call our office to schedule the follow-up appointment.   Any Other Special Instructions Will Be Listed Below (If Applicable).     If you need a refill on your cardiac medications before your next appointment, please call your pharmacy.

## 2017-09-17 DIAGNOSIS — Z85820 Personal history of malignant melanoma of skin: Secondary | ICD-10-CM | POA: Diagnosis not present

## 2017-09-17 DIAGNOSIS — D2371 Other benign neoplasm of skin of right lower limb, including hip: Secondary | ICD-10-CM | POA: Diagnosis not present

## 2017-09-17 DIAGNOSIS — Z85828 Personal history of other malignant neoplasm of skin: Secondary | ICD-10-CM | POA: Diagnosis not present

## 2017-09-17 DIAGNOSIS — L821 Other seborrheic keratosis: Secondary | ICD-10-CM | POA: Diagnosis not present

## 2017-09-17 DIAGNOSIS — D0461 Carcinoma in situ of skin of right upper limb, including shoulder: Secondary | ICD-10-CM | POA: Diagnosis not present

## 2017-09-17 DIAGNOSIS — L570 Actinic keratosis: Secondary | ICD-10-CM | POA: Diagnosis not present

## 2017-09-17 DIAGNOSIS — D485 Neoplasm of uncertain behavior of skin: Secondary | ICD-10-CM | POA: Diagnosis not present

## 2017-09-17 DIAGNOSIS — D1801 Hemangioma of skin and subcutaneous tissue: Secondary | ICD-10-CM | POA: Diagnosis not present

## 2017-09-18 NOTE — Telephone Encounter (Signed)
   Primary Cardiologist:  Dr. Tamala Julian  Chart reviewed as part of pre-operative protocol coverage. Pre-op clearance already addressed by colleagues. She was seen by Dr. Tamala Julian on 09/16/17 and clearance was stated I office note. To summarize recommendations:  -The patient is clinically stable and is cleared to proceed with upper and lower GI endoscopy.  No further cardiac evaluation is needed.  She should have appropriate endocarditis prophylaxis given her known bioprosthetic aortic valve.  Will route this bundled recommendation to requesting provider via Epic fax function. Please call with questions.  Lyda Jester, PA-C 09/18/2017, 4:28 PM

## 2017-09-18 NOTE — Telephone Encounter (Signed)
Follow Up:    [name] is calling to check on the status of pt's clearance.

## 2017-09-24 ENCOUNTER — Other Ambulatory Visit: Payer: Self-pay | Admitting: Interventional Cardiology

## 2017-09-30 DIAGNOSIS — R69 Illness, unspecified: Secondary | ICD-10-CM | POA: Diagnosis not present

## 2017-10-02 DIAGNOSIS — K2970 Gastritis, unspecified, without bleeding: Secondary | ICD-10-CM | POA: Diagnosis not present

## 2017-10-02 DIAGNOSIS — K3189 Other diseases of stomach and duodenum: Secondary | ICD-10-CM | POA: Diagnosis not present

## 2017-10-02 DIAGNOSIS — K209 Esophagitis, unspecified: Secondary | ICD-10-CM | POA: Diagnosis not present

## 2017-10-02 DIAGNOSIS — K269 Duodenal ulcer, unspecified as acute or chronic, without hemorrhage or perforation: Secondary | ICD-10-CM | POA: Diagnosis not present

## 2017-10-02 DIAGNOSIS — D509 Iron deficiency anemia, unspecified: Secondary | ICD-10-CM | POA: Diagnosis not present

## 2017-10-09 ENCOUNTER — Other Ambulatory Visit: Payer: Self-pay | Admitting: *Deleted

## 2017-10-09 MED ORDER — LOSARTAN POTASSIUM-HCTZ 100-25 MG PO TABS: 1.0000 | ORAL_TABLET | Freq: Every day | ORAL | 3 refills | Status: DC

## 2017-10-15 DIAGNOSIS — D509 Iron deficiency anemia, unspecified: Secondary | ICD-10-CM | POA: Diagnosis not present

## 2017-11-25 ENCOUNTER — Other Ambulatory Visit: Payer: Self-pay | Admitting: Interventional Cardiology

## 2017-12-11 DIAGNOSIS — Z85828 Personal history of other malignant neoplasm of skin: Secondary | ICD-10-CM | POA: Diagnosis not present

## 2017-12-11 DIAGNOSIS — L570 Actinic keratosis: Secondary | ICD-10-CM | POA: Diagnosis not present

## 2017-12-11 DIAGNOSIS — D692 Other nonthrombocytopenic purpura: Secondary | ICD-10-CM | POA: Diagnosis not present

## 2017-12-11 DIAGNOSIS — L821 Other seborrheic keratosis: Secondary | ICD-10-CM | POA: Diagnosis not present

## 2017-12-11 DIAGNOSIS — Z85820 Personal history of malignant melanoma of skin: Secondary | ICD-10-CM | POA: Diagnosis not present

## 2017-12-11 DIAGNOSIS — L309 Dermatitis, unspecified: Secondary | ICD-10-CM | POA: Diagnosis not present

## 2018-01-05 DIAGNOSIS — R69 Illness, unspecified: Secondary | ICD-10-CM | POA: Diagnosis not present

## 2018-01-08 ENCOUNTER — Other Ambulatory Visit: Payer: Self-pay | Admitting: Interventional Cardiology

## 2018-01-09 MED ORDER — HYDROCHLOROTHIAZIDE 25 MG PO TABS: 25.0000 mg | ORAL_TABLET | Freq: Every day | ORAL | 3 refills | Status: DC

## 2018-01-09 MED ORDER — LOSARTAN POTASSIUM 100 MG PO TABS: 100.0000 mg | ORAL_TABLET | Freq: Every day | ORAL | 3 refills | Status: DC

## 2018-01-09 NOTE — Telephone Encounter (Signed)
Spoke with pharmacy and they have Losartan 100mg  and HCTZ 25mg  available.  Sent in new prescriptions with medications separated since combo pill is on back order.

## 2018-01-12 ENCOUNTER — Other Ambulatory Visit: Payer: Self-pay | Admitting: Internal Medicine

## 2018-01-12 DIAGNOSIS — Z1231 Encounter for screening mammogram for malignant neoplasm of breast: Secondary | ICD-10-CM

## 2018-02-09 DIAGNOSIS — Z136 Encounter for screening for cardiovascular disorders: Secondary | ICD-10-CM | POA: Diagnosis not present

## 2018-02-09 DIAGNOSIS — I10 Essential (primary) hypertension: Secondary | ICD-10-CM | POA: Diagnosis not present

## 2018-02-09 DIAGNOSIS — I480 Paroxysmal atrial fibrillation: Secondary | ICD-10-CM | POA: Diagnosis not present

## 2018-02-09 DIAGNOSIS — Z1389 Encounter for screening for other disorder: Secondary | ICD-10-CM | POA: Diagnosis not present

## 2018-02-09 DIAGNOSIS — Z1159 Encounter for screening for other viral diseases: Secondary | ICD-10-CM | POA: Diagnosis not present

## 2018-02-09 DIAGNOSIS — Z8719 Personal history of other diseases of the digestive system: Secondary | ICD-10-CM | POA: Diagnosis not present

## 2018-02-09 DIAGNOSIS — Z0000 Encounter for general adult medical examination without abnormal findings: Secondary | ICD-10-CM | POA: Diagnosis not present

## 2018-02-09 DIAGNOSIS — Z954 Presence of other heart-valve replacement: Secondary | ICD-10-CM | POA: Diagnosis not present

## 2018-02-20 ENCOUNTER — Ambulatory Visit: Payer: Medicare HMO

## 2018-02-23 DIAGNOSIS — Z961 Presence of intraocular lens: Secondary | ICD-10-CM | POA: Diagnosis not present

## 2018-02-23 DIAGNOSIS — H04121 Dry eye syndrome of right lacrimal gland: Secondary | ICD-10-CM | POA: Diagnosis not present

## 2018-03-10 ENCOUNTER — Ambulatory Visit
Admission: RE | Admit: 2018-03-10 | Discharge: 2018-03-10 | Disposition: A | Discharge status: Home / Self Care | Payer: Medicare HMO | Source: Ambulatory Visit | Attending: Internal Medicine | Admitting: Internal Medicine

## 2018-03-10 DIAGNOSIS — Z1231 Encounter for screening mammogram for malignant neoplasm of breast: Secondary | ICD-10-CM

## 2018-03-12 DIAGNOSIS — D2371 Other benign neoplasm of skin of right lower limb, including hip: Secondary | ICD-10-CM | POA: Diagnosis not present

## 2018-03-12 DIAGNOSIS — L570 Actinic keratosis: Secondary | ICD-10-CM | POA: Diagnosis not present

## 2018-03-12 DIAGNOSIS — L565 Disseminated superficial actinic porokeratosis (DSAP): Secondary | ICD-10-CM | POA: Diagnosis not present

## 2018-03-12 DIAGNOSIS — L821 Other seborrheic keratosis: Secondary | ICD-10-CM | POA: Diagnosis not present

## 2018-03-12 DIAGNOSIS — Z85828 Personal history of other malignant neoplasm of skin: Secondary | ICD-10-CM | POA: Diagnosis not present

## 2018-03-12 DIAGNOSIS — Z85820 Personal history of malignant melanoma of skin: Secondary | ICD-10-CM | POA: Diagnosis not present

## 2018-06-09 DIAGNOSIS — L821 Other seborrheic keratosis: Secondary | ICD-10-CM | POA: Diagnosis not present

## 2018-06-09 DIAGNOSIS — L570 Actinic keratosis: Secondary | ICD-10-CM | POA: Diagnosis not present

## 2018-06-09 DIAGNOSIS — Z85828 Personal history of other malignant neoplasm of skin: Secondary | ICD-10-CM | POA: Diagnosis not present

## 2018-06-09 DIAGNOSIS — Z85820 Personal history of malignant melanoma of skin: Secondary | ICD-10-CM | POA: Diagnosis not present

## 2018-06-09 DIAGNOSIS — D1801 Hemangioma of skin and subcutaneous tissue: Secondary | ICD-10-CM | POA: Diagnosis not present

## 2018-07-06 DIAGNOSIS — R69 Illness, unspecified: Secondary | ICD-10-CM | POA: Diagnosis not present

## 2018-07-10 DIAGNOSIS — Z03818 Encounter for observation for suspected exposure to other biological agents ruled out: Secondary | ICD-10-CM | POA: Diagnosis not present

## 2018-08-24 ENCOUNTER — Other Ambulatory Visit: Payer: Self-pay | Admitting: Interventional Cardiology

## 2018-09-15 ENCOUNTER — Other Ambulatory Visit: Payer: Self-pay | Admitting: Interventional Cardiology

## 2018-09-19 DIAGNOSIS — R69 Illness, unspecified: Secondary | ICD-10-CM | POA: Diagnosis not present

## 2018-09-21 DIAGNOSIS — Z85820 Personal history of malignant melanoma of skin: Secondary | ICD-10-CM | POA: Diagnosis not present

## 2018-09-21 DIAGNOSIS — Z85828 Personal history of other malignant neoplasm of skin: Secondary | ICD-10-CM | POA: Diagnosis not present

## 2018-09-21 DIAGNOSIS — D2371 Other benign neoplasm of skin of right lower limb, including hip: Secondary | ICD-10-CM | POA: Diagnosis not present

## 2018-09-21 DIAGNOSIS — L821 Other seborrheic keratosis: Secondary | ICD-10-CM | POA: Diagnosis not present

## 2018-09-21 DIAGNOSIS — L570 Actinic keratosis: Secondary | ICD-10-CM | POA: Diagnosis not present

## 2018-09-21 DIAGNOSIS — D1801 Hemangioma of skin and subcutaneous tissue: Secondary | ICD-10-CM | POA: Diagnosis not present

## 2018-09-24 ENCOUNTER — Other Ambulatory Visit: Payer: Self-pay

## 2018-09-24 ENCOUNTER — Ambulatory Visit (HOSPITAL_COMMUNITY): Payer: Medicare HMO | Attending: Cardiology

## 2018-09-24 DIAGNOSIS — Z953 Presence of xenogenic heart valve: Secondary | ICD-10-CM

## 2018-09-29 NOTE — Progress Notes (Signed)
Cardiology Office Note:    Date:  09/30/2018   ID:  [name], DOB 1950/03/12, MRN [id number]  PCP:  Lavone Orn, MD  Cardiologist:  Sinclair Grooms, MD   Referring MD: Lavone Orn, MD   Chief Complaint  Patient presents with  . Coronary Artery Disease    History of Present Illness:    [name] is a 68 y.o. female with a hx of aortic valve replacement2011( tissue), and hypertension.  She feels well.  She has not had syncope, angina, edema, dyspnea, or prolonged palpitations.  She is very anxious concerning the findings on the most recent echo which is starting to show some evidence of bioprosthetic valve leaflet deterioration and at least on this echo evidence of progressing stenosis.  She denies chills, fever, and has become relatively sedentary.  She is gained weight.  Past Medical History:  Diagnosis Date  . Hyperplastic colon polyp 2009  . Hypertension   . Melanoma ([place]) 5-6 yrs ago  . Raynaud's disease   . S/P AVR   . Sciatic nerve disease, left     Past Surgical History:  Procedure Laterality Date  . ABDOMINAL HYSTERECTOMY  yrs ago  . BILATERAL OOPHORECTOMY  2008  . BREAST EXCISIONAL BIOPSY Right   . CARDIAC VALVE REPLACEMENT  7 yrs ago   aortic bovine valeve replave replacement   . COLONOSCOPY WITH PROPOFOL N/A 07/10/2016   Procedure: COLONOSCOPY WITH PROPOFOL;  Surgeon: Garlan Fair, MD;  Location: [facility];  Service: Endoscopy;  Laterality: N/A;  . EYE SURGERY Bilateral    ioc for cataracts    Current Medications: Current Meds  Medication Sig  . amLODipine (NORVASC) 2.5 MG tablet TAKE 1 TABLET BY MOUTH EVERY DAY  . cephALEXin (KEFLEX) 500 MG capsule Take 2,000 mg by mouth as needed (Prior to [place]).   . cholecalciferol (VITAMIN D) 1000 UNITS tablet Take 1,000 Units by mouth daily.  . fluticasone (FLONASE) 50 MCG/ACT nasal spray Place 2 sprays into the nose daily. Only during allergy season  .  losartan-hydrochlorothiazide (HYZAAR) 100-25 MG tablet TAKE 1 TABLET BY MOUTH EVERY DAY  . metoprolol succinate (TOPROL-XL) 25 MG 24 hr tablet TAKE 1 TABLET BY MOUTH EVERY DAY  . pantoprazole (PROTONIX) 40 MG tablet Take 40 mg by mouth daily.     Allergies:   Ace inhibitors, Sulfa antibiotics, and Amlodipine   Social History   Socioeconomic History  . Marital status: Widowed    Spouse name: Not on file  . Number of children: Not on file  . Years of education: Not on file  . Highest education level: Not on file  Occupational History  . Not on file  Social Needs  . Financial resource strain: Not on file  . Food insecurity    Worry: Not on file    Inability: Not on file  . Transportation needs    Medical: Not on file    Non-medical: Not on file  Tobacco Use  . Smoking status: Former Smoker    Packs/day: 1.00    Years: 30.00    Pack years: 30.00    Types: Cigarettes  . Smokeless tobacco: Never Used  . Tobacco comment: quit 13 to 15 yrs ago  Substance and Sexual Activity  . Alcohol use: Yes    Alcohol/week: 0.0 standard drinks    Comment: 1 glass wine per day  . Drug use: No  . Sexual activity: Not on file  Lifestyle  .  Physical activity    Days per week: Not on file    Minutes per session: Not on file  . Stress: Not on file  Relationships  . Social Herbalist on phone: Not on file    Gets together: Not on file    Attends religious service: Not on file    Active member of club or organization: Not on file    Attends meetings of clubs or organizations: Not on file    Relationship status: Not on file  Other Topics Concern  . Not on file  Social History Narrative  . Not on file     Family History: The patient's family history includes Breast cancer in her mother; Cancer in her father; Healthy in her sister; Heart failure in her mother; Hypertension in her brother, father, sister, and sister; Osteoporosis in her sister.  ROS:   Please see the history of  present illness.    Anxiety.  No chills or fever.  All other systems reviewed and are negative.  EKGs/Labs/Other Studies Reviewed:    The following studies were reviewed today: 2D Doppler echocardiogram performed on 09/24/2018: IMPRESSIONS    1. The left ventricle has normal systolic function with an ejection fraction of 60-65%. The cavity size was normal. Left ventricular diastolic Doppler parameters are consistent with impaired relaxation.  2. The right ventricle has normal systolic function. The cavity was normal. There is no increase in right ventricular wall thickness. Right ventricular systolic pressure is normal with an estimated pressure of 32.4 mmHg.  3. Right atrial size was mildly dilated.  4. Severely thickening of the aortic valve. Severe calcifcation of the aortic valve. Aortic valve regurgitation is moderate to severe by color flow Doppler.  5. S/P 67mm pericardial tissue valve. The valve leaflets are thickened and calcified with reduced motion. The mean AVG is 60mmHg.  6. The aorta is normal unless otherwise noted.  7. Compared to prior echo the mean AVG has increased from 34mmHg to 44mmHg.  EKG:  EKG sinus bradycardia 58 bpm, normal sinus rhythm with PACs, anterolateral T wave inversion V3 through V6 with poor R wave progression V1 through V4.  When compared to the most recent prior EKG from August 2020, the T wave inversion and premature atrial beats is new.  Recent Labs: No results found for requested labs within last 8760 hours.  Recent Lipid Panel No results found for: CHOL, TRIG, HDL, CHOLHDL, VLDL, LDLCALC, LDLDIRECT  Physical Exam:    VS:  BP (!) 142/92   Pulse (!) 58   Ht 5' 3.5" (1.613 m)   Wt 155 lb 1.9 oz (70.4 kg)   SpO2 98%   BMI 27.05 kg/m     Wt Readings from Last 3 Encounters:  09/30/18 155 lb 1.9 oz (70.4 kg)  09/16/17 147 lb 12.8 oz (67 kg)  07/29/16 149 lb (67.6 kg)     GEN: Moderate obesity. No acute distress HEENT: Normal NECK: No JVD.  LYMPHATICS: No lymphadenopathy CARDIAC:  RRR with 99991111 systolic ejction murmur, gallop, or edema. VASCULAR:  Normal Pulses. No bruits. RESPIRATORY:  Clear to auscultation without rales, wheezing or rhonchi  ABDOMEN: Soft, non-tender, non-distended, No pulsatile mass, MUSCULOSKELETAL: No deformity  SKIN: Warm and dry NEUROLOGIC:  Alert and oriented x 3 PSYCHIATRIC:  Normal affect   ASSESSMENT:    1. History of aortic valve replacement with bioprosthetic valve   2. Essential hypertension   3. Lower GI bleed   4. Raynaud's phenomenon without  gangrene   5. Educated About Covid-19 Virus Infection    PLAN:    In order of problems listed above:  1. The bioprosthetic aortic valve is starting to show evidence of dysfunction with leaflet thickening.  Echo demonstrates gradient across the valve that is 32 mmHg (mean).  She is asymptomatic.  There is no reason to do anything different at this time.  She is notified of symptoms of progressive aortic stenosis.  She is encouraged to call if shortness of breath, ankle swelling, dyspnea, chest pain, or syncope.  2D Doppler echocardiogram will be performed prior to next office visit in 1 year. 2. The blood pressure is too high.  This could be causing hemodynamic she has stress on the valve leaflets.  Increase amlodipine to 5 mg/day.  Target is to decrease the blood pressure to less than 130/80 mmHg.  If amlodipine causes side effects at the higher dose or is not effective, we can add low-dose spironolactone or eplerenone. 3. She is unable to take aspirin. 4. We did not discuss ray nodes 5. Social distancing, handwashing, and mask wearing is encouraged.  Follow-up in 1 year with an echocardiogram to help track valve function  Long discussion concerning valve deterioration, natural history, and treatment options including repeat open heart surgery versus TAVR.  Greater than 50% of the time during this office visit was spent in education, counseling,  and coordination of care related to underlying disease process and testing as outlined.    Medication Adjustments/Labs and Tests Ordered: Current medicines are reviewed at length with the patient today.  Concerns regarding medicines are outlined above.  No orders of the defined types were placed in this encounter.  No orders of the defined types were placed in this encounter.   There are no Patient Instructions on file for this visit.   Signed, Sinclair Grooms, MD  09/30/2018 3:29 PM    [place]

## 2018-09-30 ENCOUNTER — Other Ambulatory Visit: Payer: Self-pay

## 2018-09-30 ENCOUNTER — Encounter: Payer: Self-pay | Admitting: Interventional Cardiology

## 2018-09-30 ENCOUNTER — Ambulatory Visit (INDEPENDENT_AMBULATORY_CARE_PROVIDER_SITE_OTHER): Payer: Medicare HMO | Admitting: Interventional Cardiology

## 2018-09-30 VITALS — BP 142/92 | HR 58 | Ht 63.5 in | Wt 155.1 lb

## 2018-09-30 DIAGNOSIS — Z953 Presence of xenogenic heart valve: Secondary | ICD-10-CM | POA: Diagnosis not present

## 2018-09-30 DIAGNOSIS — Z7189 Other specified counseling: Secondary | ICD-10-CM

## 2018-09-30 DIAGNOSIS — I10 Essential (primary) hypertension: Secondary | ICD-10-CM | POA: Diagnosis not present

## 2018-09-30 DIAGNOSIS — I7300 Raynaud's syndrome without gangrene: Secondary | ICD-10-CM

## 2018-09-30 DIAGNOSIS — K922 Gastrointestinal hemorrhage, unspecified: Secondary | ICD-10-CM | POA: Diagnosis not present

## 2018-09-30 MED ORDER — AMLODIPINE BESYLATE 5 MG PO TABS: 5.0000 mg | ORAL_TABLET | Freq: Every day | ORAL | 3 refills | Status: DC

## 2018-09-30 NOTE — Patient Instructions (Signed)
Medication Instructions:  1) INCREASE Amlodipine to 5mg  once daily  If you need a refill on your cardiac medications before your next appointment, please call your pharmacy.   Lab work: None If you have labs (blood work) drawn today and your tests are completely normal, you will receive your results only by: Marland Kitchen MyChart Message (if you have MyChart) OR . A paper copy in the mail If you have any lab test that is abnormal or we need to change your treatment, we will call you to review the results.  Testing/Procedures: Your physician has requested that you have an echocardiogram 1-2 weeks prior to seeing Dr Tamala Julian back in one year. Echocardiography is a painless test that uses sound waves to create images of your heart. It provides your doctor with information about the size and shape of your heart and how well your heart's chambers and valves are working. This procedure takes approximately one hour. There are no restrictions for this procedure.    Follow-Up: Your physician recommends that you schedule a follow-up appointment in: 1 month with a Nurse Practitioner on our team.   At [facility], you and your health needs are our priority.  As part of our continuing mission to provide you with exceptional heart care, we have created designated Provider Care Teams.  These Care Teams include your primary Cardiologist (physician) and Advanced Practice Providers (APPs -  Physician Assistants and Nurse Practitioners) who all work together to provide you with the care you need, when you need it. You will need a follow up appointment in 12 months.  Please call our office 2 months in advance to schedule this appointment.  You may see Sinclair Grooms, MD or one of the following Advanced Practice Providers on your designated Care Team:   Truitt Merle, NP Cecilie Kicks, NP . Kathyrn Drown, NP  Any Other Special Instructions Will Be Listed Below (If Applicable).

## 2018-10-28 DIAGNOSIS — R69 Illness, unspecified: Secondary | ICD-10-CM | POA: Diagnosis not present

## 2018-10-30 NOTE — Progress Notes (Signed)
CARDIOLOGY OFFICE NOTE  Date:  11/02/2018    [name] Date of Birth: [birth year] Medical Record [id number]  PCP:  Lavone Orn, MD  Cardiologist: Dr. Daneen Schick   Chief Complaint: follow-up of hypertension  History of Present Illness: [name] is a 68 y.o. female with a history of pericardial tissue aortic valve replacement in 2011, hypertension, Raynaud's disease, and melanoma who is followed by Dr. Tamala Julian and presents today for follow-up of hypertension.   Most recent Echo from 09/24/2018 showed LVEF of 60-65% with grade 1 diastolic dysfunction. Aortic valve noted to be severely thickened/calcified with reduced motion and moderate to severe aortic regurgitation.   Patient was recently seen by Dr. Tamala Julian on 09/30/2018 at which time she reported feeling well and denied any cardiac symptoms. However, she was anxious over recent Echo findings. BP was mildly elevated at 142/92 so Amlodipine was increased to 5mg  daily with BP goal of 130/80. Plan was for follow-up in 1 year with repeat Echo prior to visit.   Patient returns today for follow-up given she previously has had significant lower extremity edema with higher doses of Amlodipine. She has been tolerating the increase in Amlodipine well. She has only had minimal ankle edema but not as bad as last time she was on this dose of Amlodipine. BP today is 130/80. She has been checking BP at home and states often in the 120/80's. She is watching is salt intake very closely. She reports occasional dizziness with quick movements of her head but otherwise denies any cardiac symptoms including chest pain, shortness of breath, palpitations, syncope, orthopnea, or PND. She has no specific questions or concerns today.   Past Medical History:  Diagnosis Date  . Hyperplastic colon polyp 2009  . Hypertension   . Melanoma ([place]) 5-6 yrs ago  . Raynaud's disease   . S/P AVR   . Sciatic nerve disease, left     Past Surgical History:   Procedure Laterality Date  . ABDOMINAL HYSTERECTOMY  yrs ago  . BILATERAL OOPHORECTOMY  2008  . BREAST EXCISIONAL BIOPSY Right   . CARDIAC VALVE REPLACEMENT  7 yrs ago   aortic bovine valeve replave replacement   . COLONOSCOPY WITH PROPOFOL N/A 07/10/2016   Procedure: COLONOSCOPY WITH PROPOFOL;  Surgeon: Garlan Fair, MD;  Location: [facility];  Service: Endoscopy;  Laterality: N/A;  . EYE SURGERY Bilateral    ioc for cataracts     Medications: Current Meds  Medication Sig  . amLODipine (NORVASC) 5 MG tablet Take 1 tablet (5 mg total) by mouth daily.  . cephALEXin (KEFLEX) 500 MG capsule Take 2,000 mg by mouth as needed (Prior to [place]).   . cholecalciferol (VITAMIN D) 1000 UNITS tablet Take 1,000 Units by mouth daily.  . fluticasone (FLONASE) 50 MCG/ACT nasal spray Place 2 sprays into the nose daily. Only during allergy season  . losartan-hydrochlorothiazide (HYZAAR) 100-25 MG tablet TAKE 1 TABLET BY MOUTH EVERY DAY  . metoprolol succinate (TOPROL-XL) 25 MG 24 hr tablet TAKE 1 TABLET BY MOUTH EVERY DAY  . pantoprazole (PROTONIX) 40 MG tablet Take 40 mg by mouth daily.     Allergies: Allergies  Allergen Reactions  . Ace Inhibitors Cough       . Sulfa Antibiotics Other (See Comments)    REACTION: "MASSIVE HEADACHE"  . Amlodipine Other (See Comments)    ANKLE SWELLING at high dose    Social History: The patient  reports that she has  quit smoking. Her smoking use included cigarettes. She has a 30.00 pack-year smoking history. She has never used smokeless tobacco. She reports current alcohol use. She reports that she does not use drugs.   Family History: The patient's family history includes Breast cancer in her mother; Cancer in her father; Healthy in her sister; Heart failure in her mother; Hypertension in her brother, father, sister, and sister; Osteoporosis in her sister.   Review of Systems: Please see the history of present illness.    All other systems are reviewed and negative.   Physical Exam: VS:  BP 130/80   Pulse 69   Ht 5' 3.5" (1.613 m)   Wt 155 lb 12.8 oz (70.7 kg)   SpO2 98%   BMI 27.17 kg/m  .  BMI Body mass index is 27.17 kg/m.  Wt Readings from Last 3 Encounters:  11/02/18 155 lb 12.8 oz (70.7 kg)  09/30/18 155 lb 1.9 oz (70.4 kg)  09/16/17 147 lb 12.8 oz (67 kg)   General: 68 y.o. female in no acute distress. Pleasant and cooperative. HEENT: Normocephalic and atraumatic. Sclera clear.  Neck: Supple. No JVD. Heart: RRR. Distinct S1 and S2. II-III/VI systolic murmur best heard at upper sternal border with radiation to right carotid. No gallops or rubs. Lungs: No increased work of breathing. Clear to ausculation bilaterally. No wheezes, rhonchi, or rales.  Abdomen: Soft, non-distended, and non-tender to palpation. Bowel sounds present. MSK: Normal strength and tone for age. Extremities: Trace ankle edema bilaterally. Skin: Warm and dry. Neuro: Alert and oriented x3. No focal deficits. Psych: Normal affect. Responds appropriately.   LABORATORY DATA:  EKG:  EKG not ordered today.  Lab Results  Component Value Date   WBC 7.2 07/10/2009   HGB 9.3 (L) 07/10/2009   HCT 27.1 (L) 07/10/2009   PLT 193 07/10/2009   GLUCOSE 99 10/20/2015   ALT 22 07/03/2009   AST 23 07/03/2009   NA 142 10/20/2015   K 4.1 10/20/2015   CL 103 10/20/2015   CREATININE 0.74 10/20/2015   BUN 16 10/20/2015   CO2 27 10/20/2015   INR 1.43 07/05/2009   HGBA1C  07/03/2009    5.4 (NOTE)                                                                       According to the ADA Clinical Practice Recommendations for 2011, when HbA1c is used as a screening test:   >=6.5%   Diagnostic of Diabetes Mellitus           (if abnormal result  is confirmed)  5.7-6.4%   Increased risk of developing Diabetes Mellitus  References:Diagnosis and Classification of Diabetes Mellitus,Diabetes [id number] 1):S62-S69 and Standards of  Medical Care in         Diabetes - 2011,Diabetes Care,2011,34  (Suppl 1):S11-S61.     BNP (last 3 results) No results for input(s): BNP in the last 8760 hours.  ProBNP (last 3 results) No results for input(s): PROBNP in the last 8760 hours.   Other Studies Reviewed Today:  Echocardiogram 09/24/2018: Impressions: 1. The left ventricle has normal systolic function with an ejection fraction of 60-65%. The cavity size was normal. Left ventricular diastolic Doppler parameters are consistent with impaired relaxation.  2. The right ventricle has normal systolic function. The cavity was normal. There is no increase in right ventricular wall thickness. Right ventricular systolic pressure is normal with an estimated pressure of 32.4 mmHg.  3. Right atrial size was mildly dilated.  4. Severely thickening of the aortic valve. Severe calcifcation of the aortic valve. Aortic valve regurgitation is moderate to severe by color flow Doppler.  5. S/P 44mm pericardial tissue valve. The valve leaflets are thickened and calcified with reduced motion. The mean AVG is 52mmHg.  6. The aorta is normal unless otherwise noted.  7. Compared to prior echo the mean AVG has increased from 49mmHg to 26mmHg.   Assessment/Plan:  Bioprosthetic Aortic Valve Replacement in 2011 - Recent Echo from 09/24/2018 showed LVEF of 60-65% with grade 1 diastolic dysfunction. Aortic valve noted to be severely thickened/calcified with reduced motion and moderate to severe aortic regurgitation.  - Per Dr. Thompson Caul note, there is no reason to do anything different at this time given patient is asymptomatic. - Will need repeat Echo in 08/2019.  - Patient to notify us of symptoms of progressive aortic stenosis including chest pain, shortness of breath, syncope. - Continue SBE prophylaxis prior to dental procedures.  Hypertension - BP better controlled today at 130/80 with increase in Amlodipine. Patient states she has been monitoring BP  at home regularly and states it is often in the 120's/80's. She is tolerating the increase in Amlodipine well with on mild ankle edema (previously has had significant lower extremity edema with higher doses of Amlodipine). - Continue current medications: Amlodipine 5mg  daily, Losartan-HCTZ 100-25mg  daily, and Toprol-XL 25mg  daily.  - Advised patient to continue to check BP at home periodically and let us know if consistently >130/80. Continue to watch salt intake. Recommended compression socks and elevating legs for mild lower extremity edema.   Current medicines are reviewed with the patient today.  The patient does not have concerns regarding medicines other than what has been noted above.  The following changes have been made:  See above.  Labs/ tests ordered today include:   No orders of the defined types were placed in this encounter.    Disposition:  Follow-up with Dr. Tamala Julian or APP in 1 year with Echo prior to visit.   Patient is agreeable to this plan and will call if any problems develop in the interim.   Signed: Darreld Mclean, PA-C  11/02/2018 3:22 PM  [facility] [address] [place], [place]  [address] Phone: [phone] Fax: [phone]

## 2018-11-02 ENCOUNTER — Encounter: Payer: Self-pay | Admitting: Student

## 2018-11-02 ENCOUNTER — Ambulatory Visit: Payer: Medicare HMO | Admitting: Student

## 2018-11-02 ENCOUNTER — Other Ambulatory Visit: Payer: Self-pay

## 2018-11-02 VITALS — BP 130/80 | HR 69 | Ht 63.5 in | Wt 155.8 lb

## 2018-11-02 DIAGNOSIS — I10 Essential (primary) hypertension: Secondary | ICD-10-CM | POA: Diagnosis not present

## 2018-11-02 DIAGNOSIS — Z953 Presence of xenogenic heart valve: Secondary | ICD-10-CM | POA: Diagnosis not present

## 2018-11-02 NOTE — Patient Instructions (Signed)
Medication Instructions:  Your physician recommends that you continue on your current medications as directed. Please refer to the Current Medication list given to you today.  If you need a refill on your cardiac medications before your next appointment, please call your pharmacy.   Lab work: -None  Testing/Procedures: -None  Follow-Up: At [facility], you and your health needs are our priority.  As part of our continuing mission to provide you with exceptional heart care, we have created designated Provider Care Teams.  These Care Teams include your primary Cardiologist (physician) and Advanced Practice Providers (APPs -  Physician Assistants and Nurse Practitioners) who all work together to provide you with the care you need, when you need it. You will need a follow up appointment in 1 years.  Please call our office 2 months in advance to schedule this appointment.  You may see Sinclair Grooms, MD or one of the following Advanced Practice Providers on your designated Care Team:   Truitt Merle, NP Cecilie Kicks, NP . Kathyrn Drown, NP  Any Other Special Instructions Will Be Listed Below (If Applicable). If your bp consistently stays above 130/80 please call office or send a MyChart message.

## 2018-11-28 ENCOUNTER — Other Ambulatory Visit: Payer: Self-pay | Admitting: Interventional Cardiology

## 2018-12-02 DIAGNOSIS — R69 Illness, unspecified: Secondary | ICD-10-CM | POA: Diagnosis not present

## 2018-12-15 DIAGNOSIS — Z03818 Encounter for observation for suspected exposure to other biological agents ruled out: Secondary | ICD-10-CM | POA: Diagnosis not present

## 2018-12-22 DIAGNOSIS — D485 Neoplasm of uncertain behavior of skin: Secondary | ICD-10-CM | POA: Diagnosis not present

## 2018-12-22 DIAGNOSIS — Z85828 Personal history of other malignant neoplasm of skin: Secondary | ICD-10-CM | POA: Diagnosis not present

## 2018-12-22 DIAGNOSIS — D1801 Hemangioma of skin and subcutaneous tissue: Secondary | ICD-10-CM | POA: Diagnosis not present

## 2018-12-22 DIAGNOSIS — Z85820 Personal history of malignant melanoma of skin: Secondary | ICD-10-CM | POA: Diagnosis not present

## 2018-12-22 DIAGNOSIS — D225 Melanocytic nevi of trunk: Secondary | ICD-10-CM | POA: Diagnosis not present

## 2018-12-22 DIAGNOSIS — L814 Other melanin hyperpigmentation: Secondary | ICD-10-CM | POA: Diagnosis not present

## 2018-12-29 DIAGNOSIS — R69 Illness, unspecified: Secondary | ICD-10-CM | POA: Diagnosis not present

## 2019-01-08 DIAGNOSIS — K922 Gastrointestinal hemorrhage, unspecified: Secondary | ICD-10-CM | POA: Diagnosis not present

## 2019-01-25 ENCOUNTER — Other Ambulatory Visit: Payer: Self-pay | Admitting: Internal Medicine

## 2019-01-25 DIAGNOSIS — Z1231 Encounter for screening mammogram for malignant neoplasm of breast: Secondary | ICD-10-CM

## 2019-01-26 DIAGNOSIS — L83 Acanthosis nigricans: Secondary | ICD-10-CM | POA: Diagnosis not present

## 2019-01-26 DIAGNOSIS — L565 Disseminated superficial actinic porokeratosis (DSAP): Secondary | ICD-10-CM | POA: Diagnosis not present

## 2019-01-26 DIAGNOSIS — I480 Paroxysmal atrial fibrillation: Secondary | ICD-10-CM | POA: Diagnosis not present

## 2019-01-26 DIAGNOSIS — Z85828 Personal history of other malignant neoplasm of skin: Secondary | ICD-10-CM | POA: Diagnosis not present

## 2019-01-26 DIAGNOSIS — D485 Neoplasm of uncertain behavior of skin: Secondary | ICD-10-CM | POA: Diagnosis not present

## 2019-01-26 DIAGNOSIS — L570 Actinic keratosis: Secondary | ICD-10-CM | POA: Diagnosis not present

## 2019-01-26 DIAGNOSIS — L821 Other seborrheic keratosis: Secondary | ICD-10-CM | POA: Diagnosis not present

## 2019-01-26 DIAGNOSIS — I10 Essential (primary) hypertension: Secondary | ICD-10-CM | POA: Diagnosis not present

## 2019-02-04 DIAGNOSIS — L570 Actinic keratosis: Secondary | ICD-10-CM | POA: Diagnosis not present

## 2019-02-04 DIAGNOSIS — Z85828 Personal history of other malignant neoplasm of skin: Secondary | ICD-10-CM | POA: Diagnosis not present

## 2019-02-10 ENCOUNTER — Other Ambulatory Visit: Payer: Self-pay | Admitting: Interventional Cardiology

## 2019-02-11 DIAGNOSIS — Z8719 Personal history of other diseases of the digestive system: Secondary | ICD-10-CM | POA: Diagnosis not present

## 2019-02-11 DIAGNOSIS — Z131 Encounter for screening for diabetes mellitus: Secondary | ICD-10-CM | POA: Diagnosis not present

## 2019-02-11 DIAGNOSIS — Z954 Presence of other heart-valve replacement: Secondary | ICD-10-CM | POA: Diagnosis not present

## 2019-02-11 DIAGNOSIS — I10 Essential (primary) hypertension: Secondary | ICD-10-CM | POA: Diagnosis not present

## 2019-02-11 DIAGNOSIS — R739 Hyperglycemia, unspecified: Secondary | ICD-10-CM | POA: Diagnosis not present

## 2019-02-11 DIAGNOSIS — I480 Paroxysmal atrial fibrillation: Secondary | ICD-10-CM | POA: Diagnosis not present

## 2019-02-11 DIAGNOSIS — Z0000 Encounter for general adult medical examination without abnormal findings: Secondary | ICD-10-CM | POA: Diagnosis not present

## 2019-02-11 DIAGNOSIS — Z1389 Encounter for screening for other disorder: Secondary | ICD-10-CM | POA: Diagnosis not present

## 2019-02-15 DIAGNOSIS — R69 Illness, unspecified: Secondary | ICD-10-CM | POA: Diagnosis not present

## 2019-02-17 ENCOUNTER — Encounter (HOSPITAL_COMMUNITY): Payer: Self-pay

## 2019-02-17 ENCOUNTER — Other Ambulatory Visit: Payer: Self-pay

## 2019-02-17 ENCOUNTER — Inpatient Hospital Stay (HOSPITAL_COMMUNITY)
Admission: EM | Admit: 2019-02-17 | Discharge: 2019-02-20 | DRG: 378 | Disposition: A | Discharge status: Home / Self Care | Payer: Medicare HMO | Attending: Internal Medicine | Admitting: Internal Medicine

## 2019-02-17 DIAGNOSIS — K31819 Angiodysplasia of stomach and duodenum without bleeding: Secondary | ICD-10-CM | POA: Diagnosis not present

## 2019-02-17 DIAGNOSIS — Z8249 Family history of ischemic heart disease and other diseases of the circulatory system: Secondary | ICD-10-CM | POA: Diagnosis not present

## 2019-02-17 DIAGNOSIS — K635 Polyp of colon: Secondary | ICD-10-CM | POA: Diagnosis present

## 2019-02-17 DIAGNOSIS — D122 Benign neoplasm of ascending colon: Secondary | ICD-10-CM | POA: Diagnosis not present

## 2019-02-17 DIAGNOSIS — K648 Other hemorrhoids: Secondary | ICD-10-CM | POA: Diagnosis present

## 2019-02-17 DIAGNOSIS — K222 Esophageal obstruction: Secondary | ICD-10-CM | POA: Diagnosis present

## 2019-02-17 DIAGNOSIS — K625 Hemorrhage of anus and rectum: Secondary | ICD-10-CM | POA: Diagnosis not present

## 2019-02-17 DIAGNOSIS — Z79899 Other long term (current) drug therapy: Secondary | ICD-10-CM

## 2019-02-17 DIAGNOSIS — K449 Diaphragmatic hernia without obstruction or gangrene: Secondary | ICD-10-CM | POA: Diagnosis not present

## 2019-02-17 DIAGNOSIS — D125 Benign neoplasm of sigmoid colon: Secondary | ICD-10-CM | POA: Diagnosis not present

## 2019-02-17 DIAGNOSIS — K209 Esophagitis, unspecified without bleeding: Secondary | ICD-10-CM | POA: Diagnosis not present

## 2019-02-17 DIAGNOSIS — I7300 Raynaud's syndrome without gangrene: Secondary | ICD-10-CM | POA: Diagnosis present

## 2019-02-17 DIAGNOSIS — Z882 Allergy status to sulfonamides status: Secondary | ICD-10-CM | POA: Diagnosis not present

## 2019-02-17 DIAGNOSIS — I10 Essential (primary) hypertension: Secondary | ICD-10-CM | POA: Diagnosis not present

## 2019-02-17 DIAGNOSIS — K5730 Diverticulosis of large intestine without perforation or abscess without bleeding: Secondary | ICD-10-CM | POA: Diagnosis present

## 2019-02-17 DIAGNOSIS — Z85820 Personal history of malignant melanoma of skin: Secondary | ICD-10-CM | POA: Diagnosis not present

## 2019-02-17 DIAGNOSIS — Z888 Allergy status to other drugs, medicaments and biological substances status: Secondary | ICD-10-CM

## 2019-02-17 DIAGNOSIS — Z20822 Contact with and (suspected) exposure to covid-19: Secondary | ICD-10-CM | POA: Diagnosis present

## 2019-02-17 DIAGNOSIS — K621 Rectal polyp: Secondary | ICD-10-CM | POA: Diagnosis present

## 2019-02-17 DIAGNOSIS — Z8711 Personal history of peptic ulcer disease: Secondary | ICD-10-CM

## 2019-02-17 DIAGNOSIS — K5521 Angiodysplasia of colon with hemorrhage: Principal | ICD-10-CM | POA: Diagnosis present

## 2019-02-17 DIAGNOSIS — K921 Melena: Secondary | ICD-10-CM | POA: Diagnosis not present

## 2019-02-17 DIAGNOSIS — K31811 Angiodysplasia of stomach and duodenum with bleeding: Secondary | ICD-10-CM | POA: Diagnosis present

## 2019-02-17 DIAGNOSIS — K2970 Gastritis, unspecified, without bleeding: Secondary | ICD-10-CM | POA: Diagnosis present

## 2019-02-17 DIAGNOSIS — Z953 Presence of xenogenic heart valve: Secondary | ICD-10-CM

## 2019-02-17 DIAGNOSIS — K2980 Duodenitis without bleeding: Secondary | ICD-10-CM | POA: Diagnosis present

## 2019-02-17 DIAGNOSIS — D62 Acute posthemorrhagic anemia: Secondary | ICD-10-CM | POA: Diagnosis not present

## 2019-02-17 DIAGNOSIS — K3189 Other diseases of stomach and duodenum: Secondary | ICD-10-CM | POA: Diagnosis present

## 2019-02-17 DIAGNOSIS — Q2739 Arteriovenous malformation, other site: Secondary | ICD-10-CM | POA: Diagnosis not present

## 2019-02-17 DIAGNOSIS — Z8262 Family history of osteoporosis: Secondary | ICD-10-CM

## 2019-02-17 DIAGNOSIS — K5520 Angiodysplasia of colon without hemorrhage: Secondary | ICD-10-CM | POA: Diagnosis not present

## 2019-02-17 DIAGNOSIS — Z803 Family history of malignant neoplasm of breast: Secondary | ICD-10-CM

## 2019-02-17 DIAGNOSIS — D179 Benign lipomatous neoplasm, unspecified: Secondary | ICD-10-CM | POA: Diagnosis present

## 2019-02-17 DIAGNOSIS — K922 Gastrointestinal hemorrhage, unspecified: Secondary | ICD-10-CM | POA: Diagnosis present

## 2019-02-17 DIAGNOSIS — Z03818 Encounter for observation for suspected exposure to other biological agents ruled out: Secondary | ICD-10-CM | POA: Diagnosis not present

## 2019-02-17 DIAGNOSIS — Z87891 Personal history of nicotine dependence: Secondary | ICD-10-CM

## 2019-02-17 DIAGNOSIS — G709 Myoneural disorder, unspecified: Secondary | ICD-10-CM | POA: Diagnosis present

## 2019-02-17 HISTORY — DX: Gastrointestinal hemorrhage, unspecified: K92.2

## 2019-02-17 LAB — CBC
HCT: 35 % — ABNORMAL LOW (ref 36.0–46.0)
HCT: 31.4 % — ABNORMAL LOW (ref 36.0–46.0)
Hemoglobin: 11.2 g/dL — ABNORMAL LOW (ref 12.0–15.0)
Hemoglobin: 10.3 g/dL — ABNORMAL LOW (ref 12.0–15.0)
MCH: 31 pg (ref 26.0–34.0)
MCH: 30.7 pg (ref 26.0–34.0)
MCHC: 32 g/dL (ref 30.0–36.0)
MCHC: 32.8 g/dL (ref 30.0–36.0)
MCV: 97 fL (ref 80.0–100.0)
MCV: 93.7 fL (ref 80.0–100.0)
Platelets: 336 10*3/uL (ref 150–400)
Platelets: 279 10*3/uL (ref 150–400)
RBC: 3.61 MIL/uL — ABNORMAL LOW (ref 3.87–5.11)
RBC: 3.35 MIL/uL — ABNORMAL LOW (ref 3.87–5.11)
RDW: 12 % (ref 11.5–15.5)
RDW: 12 % (ref 11.5–15.5)
WBC: 5.9 10*3/uL (ref 4.0–10.5)
WBC: 7.2 10*3/uL (ref 4.0–10.5)
nRBC: 0 % (ref 0.0–0.2)
nRBC: 0 % (ref 0.0–0.2)

## 2019-02-17 LAB — COMPREHENSIVE METABOLIC PANEL
ALT: 17 U/L (ref 0–44)
AST: 20 U/L (ref 15–41)
Albumin: 3.5 g/dL (ref 3.5–5.0)
Alkaline Phosphatase: 94 U/L (ref 38–126)
Anion gap: 12 (ref 5–15)
BUN: 21 mg/dL (ref 8–23)
CO2: 24 mmol/L (ref 22–32)
Calcium: 9.2 mg/dL (ref 8.9–10.3)
Chloride: 102 mmol/L (ref 98–111)
Creatinine, Ser: 0.84 mg/dL (ref 0.44–1.00)
GFR calc Af Amer: >60 mL/min (ref >60)
GFR calc non Af Amer: >60 mL/min (ref >60)
Glucose, Bld: 137 mg/dL — ABNORMAL HIGH (ref 70–99)
Potassium: 3.3 mmol/L — ABNORMAL LOW (ref 3.5–5.1)
Sodium: 138 mmol/L (ref 135–145)
Total Bilirubin: 0.8 mg/dL (ref 0.3–1.2)
Total Protein: 6.1 g/dL — ABNORMAL LOW (ref 6.5–8.1)

## 2019-02-17 LAB — TYPE AND SCREEN
ABO/RH(D): O POS
Antibody Screen: NEGATIVE

## 2019-02-17 LAB — HIV ANTIBODY (ROUTINE TESTING W REFLEX): HIV Screen 4th Generation wRfx: NONREACTIVE

## 2019-02-17 LAB — SARS CORONAVIRUS 2 (TAT 6-24 HRS): SARS Coronavirus 2: NEGATIVE

## 2019-02-17 MED ORDER — PEG 3350-KCL-NA BICARB-NACL 420 G PO SOLR
4000.0000 mL | Freq: Once | ORAL | Status: AC
  Administered 2019-02-17: 4000 mL via ORAL
  Filled 2019-02-17: qty 4000

## 2019-02-17 MED ORDER — AMLODIPINE BESYLATE 5 MG PO TABS
5.0000 mg | ORAL_TABLET | Freq: Every day | ORAL | Status: DC
  Filled 2019-02-17 (×2): qty 1

## 2019-02-17 MED ORDER — PANTOPRAZOLE SODIUM 40 MG PO TBEC: 40.0000 mg | DELAYED_RELEASE_TABLET | Freq: Two times a day (BID) | ORAL | Status: DC

## 2019-02-17 MED ORDER — ACETAMINOPHEN 650 MG RE SUPP: 650.0000 mg | Freq: Four times a day (QID) | RECTAL | Status: DC | PRN

## 2019-02-17 MED ORDER — PANTOPRAZOLE SODIUM 40 MG IV SOLR
40.0000 mg | Freq: Every day | INTRAVENOUS | Status: DC
  Administered 2019-02-18 – 2019-02-19 (×2): 40 mg via INTRAVENOUS
  Filled 2019-02-17 (×4): qty 40

## 2019-02-17 MED ORDER — ONDANSETRON HCL 4 MG/2ML IJ SOLN
4.0000 mg | Freq: Four times a day (QID) | INTRAMUSCULAR | Status: DC | PRN
  Administered 2019-02-17 – 2019-02-18 (×2): 4 mg via INTRAVENOUS
  Filled 2019-02-17 (×2): qty 2

## 2019-02-17 MED ORDER — ACETAMINOPHEN 325 MG PO TABS: 650.0000 mg | ORAL_TABLET | Freq: Four times a day (QID) | ORAL | Status: DC | PRN

## 2019-02-17 MED ORDER — LOSARTAN POTASSIUM 50 MG PO TABS
100.0000 mg | ORAL_TABLET | Freq: Every day | ORAL | Status: DC
  Administered 2019-02-18: 100 mg via ORAL
  Filled 2019-02-17 (×2): qty 2

## 2019-02-17 MED ORDER — LACTATED RINGERS IV SOLN: INTRAVENOUS | Status: DC

## 2019-02-17 MED ORDER — PANTOPRAZOLE SODIUM 40 MG IV SOLR
40.0000 mg | Freq: Once | INTRAVENOUS | Status: AC
  Administered 2019-02-17: 40 mg via INTRAVENOUS
  Filled 2019-02-17: qty 40

## 2019-02-17 MED ORDER — ONDANSETRON HCL 4 MG PO TABS
4.0000 mg | ORAL_TABLET | Freq: Four times a day (QID) | ORAL | Status: DC | PRN
  Administered 2019-02-18 (×2): 4 mg via ORAL
  Filled 2019-02-17 (×2): qty 1

## 2019-02-17 MED ORDER — LOSARTAN POTASSIUM-HCTZ 100-25 MG PO TABS: 1.0000 | ORAL_TABLET | Freq: Every day | ORAL | Status: DC

## 2019-02-17 MED ORDER — HYDROCHLOROTHIAZIDE 25 MG PO TABS
25.0000 mg | ORAL_TABLET | Freq: Every day | ORAL | Status: DC
  Administered 2019-02-18: 25 mg via ORAL
  Filled 2019-02-17 (×2): qty 1

## 2019-02-17 MED ORDER — SODIUM CHLORIDE 0.9% FLUSH
3.0000 mL | Freq: Two times a day (BID) | INTRAVENOUS | Status: DC
  Administered 2019-02-17 – 2019-02-18 (×2): 3 mL via INTRAVENOUS

## 2019-02-17 MED ORDER — METOPROLOL SUCCINATE ER 25 MG PO TB24
25.0000 mg | ORAL_TABLET | Freq: Every day | ORAL | Status: DC
  Administered 2019-02-17: 25 mg via ORAL
  Filled 2019-02-17 (×5): qty 1

## 2019-02-17 NOTE — Progress Notes (Signed)
Notified by RN of pt's intolerance of prep:  As of this time, she has consumed approx 1/4 of the gallon, most of which she has vomiting.  She has had neither further rectal bleeding nor any stools during the prep.  IMPR:  Given the hour, it is not feasible to think that the patient can complete a prep by midnight (procedure was planned for 8:45 am).    PLAN:  D/c prep for now, allow clr liq, cx colon/?egd for tomorrow, will notify Dr. Therisa Doyne who can decide how to proceed from here.  Cleotis Nipper, M.D. Pager [phone] If no answer or after 5 PM call [phone]

## 2019-02-17 NOTE — Plan of Care (Signed)
  Problem: Education: Goal: Ability to identify signs and symptoms of gastrointestinal bleeding will improve Outcome: Progressing   Problem: Fluid Volume: Goal: Will show no signs and symptoms of excessive bleeding Outcome: Progressing   Problem: Clinical Measurements: Goal: Complications related to the disease process, condition or treatment will be avoided or minimized Outcome: Progressing   

## 2019-02-17 NOTE — Consult Note (Signed)
[facility] Consult  Referring Provider: Robinson, [place] N, PA-C/ER Primary Care Physician:  Lavone Orn, MD Primary Gastroenterologist: Dr.Martin Wynetta Emery  Reason for Consultation: Rectal bleeding  HPI: [name] is a 69 y.o. female was in her usual state of health until today morning when she had several episodes of painless rectal bleeding, which she describes as bright red with very little stool. I saw pictures of toilet bowl filled with bright red blood which she had captured today morning. Patient is not on any blood thinners, last colonoscopy is was in 2018 performed for screening where she had several tubular adenomas and hyperplastic polyps removed, was noted to have left colonic diverticulosis, had a 6 mm ascending colon AVM which was treated with placement of 2 endoclips.  She states she has history of peptic ulcer disease, on review of her prior EGD from 11/2017 performed in [place], she was noted to have esophagitis, gastritis, duodenal lipomas and prominent Brunner's glands.  In 11/2017 she had black stools, was hospitalized and needed 2 units of PRBC transfusion, after the EGD she has been taking pantoprazole 40 mg twice daily. She denies use of NSAIDs.  She used to be on aspirin 81 mg which she has stopped for over a year. Patient states since November 2020 she has had intermittent episodes of bleeding, where she describes the stools to be dark red minimal amount of blood in it, which would occur a few times a week.  She has not had any abdominal pain, nausea or vomiting. She denies acid reflux or heartburn. She denies difficulty swallowing or pain on swallowing. Denies loss of appetite or unintentional weight loss. There is no family history of colon cancer.   Past Medical History:  Diagnosis Date  . Hyperplastic colon polyp 2009  . Hypertension   . Melanoma ([place]) 5-6 yrs ago  . Raynaud's disease   . S/P AVR   . Sciatic nerve disease, left      Past Surgical History:  Procedure Laterality Date  . ABDOMINAL HYSTERECTOMY  yrs ago  . BILATERAL OOPHORECTOMY  2008  . BREAST EXCISIONAL BIOPSY Right   . CARDIAC VALVE REPLACEMENT  7 yrs ago   aortic bovine valeve replave replacement   . COLONOSCOPY WITH PROPOFOL N/A 07/10/2016   Procedure: COLONOSCOPY WITH PROPOFOL;  Surgeon: Garlan Fair, MD;  Location: [facility];  Service: Endoscopy;  Laterality: N/A;  . EYE SURGERY Bilateral    ioc for cataracts    Prior to Admission medications   Medication Sig Start Date End Date Taking? Authorizing Provider  amLODipine (NORVASC) 5 MG tablet Take 1 tablet (5 mg total) by mouth daily. 09/30/18  Yes Belva Crome, MD  cephALEXin (KEFLEX) 500 MG capsule Take 2,000 mg by mouth as needed (Prior to [place]).    Yes [provider]  cholecalciferol (VITAMIN D) 1000 UNITS tablet Take 1,000 Units by mouth daily.   Yes [provider]  fluticasone (FLONASE) 50 MCG/ACT nasal spray Place 2 sprays into the nose daily. Only during allergy season   Yes [provider]  losartan-hydrochlorothiazide (HYZAAR) 100-25 MG tablet TAKE 1 TABLET BY MOUTH EVERY DAY Patient taking differently: Take 1 tablet by mouth daily.  12/01/18  Yes Belva Crome, MD  metoprolol succinate (TOPROL-XL) 25 MG 24 hr tablet TAKE 1 TABLET BY MOUTH EVERY DAY Patient taking differently: Take 25 mg by mouth daily.  12/01/18  Yes Belva Crome, MD  pantoprazole (PROTONIX) 40 MG tablet  Take 40 mg by mouth 2 (two) times daily.  08/28/17  Yes [provider]    Current Facility-Administered Medications  Medication Dose Route Frequency Provider Last Rate Last Admin  . pantoprazole (PROTONIX) injection 40 mg  40 mg Intravenous Once Robinson, [place] N, PA-C       Current Outpatient Medications  Medication Sig Dispense Refill  . amLODipine (NORVASC) 5 MG tablet Take 1 tablet (5 mg total) by mouth daily. 90 tablet 3  .  cephALEXin (KEFLEX) 500 MG capsule Take 2,000 mg by mouth as needed (Prior to [place]).     . cholecalciferol (VITAMIN D) 1000 UNITS tablet Take 1,000 Units by mouth daily.    . fluticasone (FLONASE) 50 MCG/ACT nasal spray Place 2 sprays into the nose daily. Only during allergy season    . losartan-hydrochlorothiazide (HYZAAR) 100-25 MG tablet TAKE 1 TABLET BY MOUTH EVERY DAY (Patient taking differently: Take 1 tablet by mouth daily. ) 90 tablet 3  . metoprolol succinate (TOPROL-XL) 25 MG 24 hr tablet TAKE 1 TABLET BY MOUTH EVERY DAY (Patient taking differently: Take 25 mg by mouth daily. ) 90 tablet 3  . pantoprazole (PROTONIX) 40 MG tablet Take 40 mg by mouth 2 (two) times daily.   0    Allergies as of 02/17/2019 - Review Complete 02/17/2019  Allergen Reaction Noted  . Ace inhibitors Cough 08/02/2013  . Sulfa antibiotics Other (See Comments) 05/08/2012  . Amlodipine Other (See Comments) 08/02/2013    Family History  Problem Relation Age of Onset  . Heart failure Mother   . Breast cancer Mother        in 2's  . Cancer Father   . Hypertension Father   . Healthy Sister   . Hypertension Brother   . Hypertension Sister   . Osteoporosis Sister   . Hypertension Sister     Social History   Socioeconomic History  . Marital status: Widowed    Spouse name: Not on file  . Number of children: Not on file  . Years of education: Not on file  . Highest education level: Not on file  Occupational History  . Occupation: retired  Tobacco Use  . Smoking status: Former Smoker    Packs/day: 1.00    Years: 40.00    Pack years: 40.00    Types: Cigarettes    Quit date: 2005    Years since quitting: 16.0  . Smokeless tobacco: Never Used  Substance and Sexual Activity  . Alcohol use: Yes    Alcohol/week: 0.0 standard drinks    Comment: 1 glass wine per day  . Drug use: No  . Sexual activity: Not on file  Other Topics Concern  . Not on file  Social History  Narrative  . Not on file   Social Determinants of Health   Financial Resource Strain:   . Difficulty of Paying Living Expenses: Not on file  Food Insecurity:   . Worried About Charity fundraiser in the Last Year: Not on file  . Ran Out of Food in the Last Year: Not on file  Transportation Needs:   . Lack of Transportation (Medical): Not on file  . Lack of Transportation (Non-Medical): Not on file  Physical Activity:   . Days of Exercise per Week: Not on file  . Minutes of Exercise per Session: Not on file  Stress:   . Feeling of Stress : Not on file  Social Connections:   . Frequency of Communication  with Friends and Family: Not on file  . Frequency of Social Gatherings with Friends and Family: Not on file  . Attends Religious Services: Not on file  . Active Member of Clubs or Organizations: Not on file  . Attends Archivist Meetings: Not on file  . Marital Status: Not on file  Intimate Partner Violence:   . Fear of Current or Ex-Partner: Not on file  . Emotionally Abused: Not on file  . Physically Abused: Not on file  . Sexually Abused: Not on file    Review of Systems:  GI: Described in detail in HPI.    Gen: Denies any fever, chills, rigors, night sweats, anorexia, fatigue, weakness, malaise, involuntary weight loss, and sleep disorder CV: Denies chest pain, angina, palpitations, syncope, orthopnea, PND, peripheral edema, and claudication. Resp: Denies dyspnea, cough, sputum, wheezing, coughing up blood. GU : Denies urinary burning, blood in urine, urinary frequency, urinary hesitancy, nocturnal urination, and urinary incontinence. MS: Denies joint pain or swelling.  Denies muscle weakness, cramps, atrophy.  Derm: Denies rash, itching, oral ulcerations, hives, unhealing ulcers.  Psych: Denies depression, anxiety, memory loss, suicidal ideation, hallucinations,  and confusion. Heme: Denies bruising and enlarged lymph nodes. Neuro:  Denies any headaches,  dizziness, paresthesias. Endo:  Denies any problems with DM, thyroid, adrenal function.  Physical Exam: Vital signs in last 24 hours: Temp:  [97.7 F (36.5 C)] 97.7 F (36.5 C) (01/20 0357) Pulse Rate:  [73-87] 86 (01/20 1100) Resp:  [14] 14 (01/20 0357) BP: (109-133)/(64-76) 132/76 (01/20 0715) SpO2:  [100 %] 100 % (01/20 1100) Weight:  [70.7 kg] 70.7 kg (01/20 1011)    General:   Alert,  Well-developed, well-nourished, pleasant and cooperative in NAD Head:  Normocephalic and atraumatic. Eyes:  Sclera clear, no icterus.   Conjunctiva pink. Ears:  Normal auditory acuity. Nose:  No deformity, discharge,  or lesions. Mouth:  No deformity or lesions.  Oropharynx pink & moist. Neck:  Supple; no masses or thyromegaly. Lungs:  Clear throughout to auscultation.   No wheezes, crackles, or rhonchi. No acute distress. Heart: Upper midsternal scar, well-healed, regular rate and rhythm; no murmurs, clicks, rubs,  or gallops. Extremities:  Without clubbing or edema. Neurologic:  Alert and  oriented x4;  grossly normal neurologically. Skin:  Intact without significant lesions or rashes. Psych:  Alert and cooperative. Normal mood and affect. Abdomen:  Soft, nontender and nondistended. No masses, hepatosplenomegaly or hernias noted. Normal bowel sounds, without guarding, and without rebound.     As per documentation, rectal exam performed in the ED showed gross dark red blood.     Lab Results: Recent Labs    02/17/19 0423  WBC 5.9  HGB 11.2*  HCT 35.0*  PLT 336   BMET Recent Labs    02/17/19 0423  NA 138  K 3.3*  CL 102  CO2 24  GLUCOSE 137*  BUN 21  CREATININE 0.84  CALCIUM 9.2   LFT Recent Labs    02/17/19 0423  PROT 6.1*  ALBUMIN 3.5  AST 20  ALT 17  ALKPHOS 94  BILITOT 0.8   PT/INR No results for input(s): LABPROT, INR in the last 72 hours.  Studies/Results: No results found.  Impression: Painless hematochezia without hemodynamic instability, hemoglobin  11.2. Patient has history of left colonic diverticulosis and and ascending colon AVM noted during colonoscopy from 2018. Bleeding could be related to AVM, or diverticulosis.  This is less likely upper GI bleeding, although there is minimal elevation in  BUN/creatinine ratio, 21/0.84.  Plan: Patient will benefit from colonoscopy. We will keep on on clear liquid diet, give a colonic prep, in anticipation of colonoscopy to be performed in a.m.. If colonoscopy is unremarkable, will plan to proceed with EGD. Discussed the same with the patient, she verbalized understanding and consents. The risks and the benefits of the procedure were discussed with the patient in detail.   LOS: 0 days   Ronnette Juniper, MD  02/17/2019, 11:42 AM

## 2019-02-17 NOTE — ED Triage Notes (Signed)
Pt states that she began to have rectal bleeding this evening, bright red, hx of the same a year ago and found ulcers.

## 2019-02-17 NOTE — H&P (Signed)
History and Physical    ARROW HOUGE [id number] DOB: [birth year] DOA: 02/17/2019  PCP: Lavone Orn, MD Consultants:  Tamala Julian - cardiology; [facility] called today to set up an appointment Patient coming from:  Home - lives alone; NOK: Marshia Ly, [id number]  Chief Complaint: rectal bleeding  HPI: [name] is a 69 y.o. female with medical history significant of s/p AVR not on [facility];  and HTN presenting with rectal bleeding.  She reports that a year and a half ago she had a very bad GI bleed; they thought it was a duodenal ulcer they saw on EGD.  This happened in [place], [place]; she was admitted and transfused 2 unit PRBC.  She was not scoped while in the hospital.  No problems since then.  She started right before Thanksgiving with ?blood in stools.  She would have intermittent black stools.  Today, she had 5 episodes of BRPPR.  There was clearly blood in the commode.  She denies pain, a "little bit of a gurgle."  This is similar to her episode a year and a half ago.  She did not have a c-scope because she had had a prior c-scope with polyps.  She is not light-headed or dizzy.  Her last episode of bleeding was in the ER with clots.  ED Course:  H/o bleeding duondenal ulcer requiring transfusion in 2019, presenting with BRBPR today.  Hgb stable but multiple episodes of gross blood today.  No NSAIDs.  Dr. Alessandra Bevels recommends admission, clear liquids.  Review of Systems: As per HPI; otherwise review of systems reviewed and negative.   Ambulatory Status:  Ambulates without assistance  Past Medical History:  Diagnosis Date  . Hyperplastic colon polyp 2009  . Hypertension   . Melanoma ([place]) 5-6 yrs ago  . Raynaud's disease   . S/P AVR   . Sciatic nerve disease, left     Past Surgical History:  Procedure Laterality Date  . ABDOMINAL HYSTERECTOMY  yrs ago  . BILATERAL OOPHORECTOMY  2008  . BREAST EXCISIONAL BIOPSY Right   . CARDIAC VALVE REPLACEMENT  7 yrs ago   aortic  bovine valeve replave replacement   . COLONOSCOPY WITH PROPOFOL N/A 07/10/2016   Procedure: COLONOSCOPY WITH PROPOFOL;  Surgeon: Garlan Fair, MD;  Location: [facility] ENDOSCOPY;  Service: Endoscopy;  Laterality: N/A;  . EYE SURGERY Bilateral    ioc for cataracts    Social History   Socioeconomic History  . Marital status: Widowed    Spouse name: Not on file  . Number of children: Not on file  . Years of education: Not on file  . Highest education level: Not on file  Occupational History  . Occupation: retired  Tobacco Use  . Smoking status: Former Smoker    Packs/day: 1.00    Years: 40.00    Pack years: 40.00    Types: Cigarettes    Quit date: 2005    Years since quitting: 16.0  . Smokeless tobacco: Never Used  Substance and Sexual Activity  . Alcohol use: Yes    Alcohol/week: 0.0 standard drinks    Comment: 1 glass wine per day  . Drug use: No  . Sexual activity: Not on file  Other Topics Concern  . Not on file  Social History Narrative  . Not on file   Social Determinants of Health   Financial Resource Strain:   . Difficulty of Paying Living Expenses: Not on file  Food Insecurity:   . Worried About  Running Out of Food in the Last Year: Not on file  . Ran Out of Food in the Last Year: Not on file  Transportation Needs:   . Lack of Transportation (Medical): Not on file  . Lack of Transportation (Non-Medical): Not on file  Physical Activity:   . Days of Exercise per Week: Not on file  . Minutes of Exercise per Session: Not on file  Stress:   . Feeling of Stress : Not on file  Social Connections:   . Frequency of Communication with Friends and Family: Not on file  . Frequency of Social Gatherings with Friends and Family: Not on file  . Attends Religious Services: Not on file  . Active Member of Clubs or Organizations: Not on file  . Attends Archivist Meetings: Not on file  . Marital Status: Not on file  Intimate Partner Violence:   . Fear of Current  or Ex-Partner: Not on file  . Emotionally Abused: Not on file  . Physically Abused: Not on file  . Sexually Abused: Not on file    Allergies  Allergen Reactions  . Ace Inhibitors Cough       . Sulfa Antibiotics Other (See Comments)    REACTION: "MASSIVE HEADACHE"  . Amlodipine Other (See Comments)    ANKLE SWELLING at high dose    Family History  Problem Relation Age of Onset  . Heart failure Mother   . Breast cancer Mother        in 60's  . Cancer Father   . Hypertension Father   . Healthy Sister   . Hypertension Brother   . Hypertension Sister   . Osteoporosis Sister   . Hypertension Sister     Prior to Admission medications   Medication Sig Start Date End Date Taking? Authorizing Provider  amLODipine (NORVASC) 5 MG tablet Take 1 tablet (5 mg total) by mouth daily. 09/30/18  Yes Belva Crome, MD  cephALEXin (KEFLEX) 500 MG capsule Take 2,000 mg by mouth as needed (Prior to [place]).    Yes [provider]  cholecalciferol (VITAMIN D) 1000 UNITS tablet Take 1,000 Units by mouth daily.   Yes [provider]  fluticasone (FLONASE) 50 MCG/ACT nasal spray Place 2 sprays into the nose daily. Only during allergy season   Yes [provider]  losartan-hydrochlorothiazide (HYZAAR) 100-25 MG tablet TAKE 1 TABLET BY MOUTH EVERY DAY Patient taking differently: Take 1 tablet by mouth daily.  12/01/18  Yes Belva Crome, MD  metoprolol succinate (TOPROL-XL) 25 MG 24 hr tablet TAKE 1 TABLET BY MOUTH EVERY DAY Patient taking differently: Take 25 mg by mouth daily.  12/01/18  Yes Belva Crome, MD  pantoprazole (PROTONIX) 40 MG tablet Take 40 mg by mouth 2 (two) times daily.  08/28/17  Yes [provider]    Physical Exam: Vitals:   02/17/19 0715 02/17/19 1011 02/17/19 1100 02/17/19 1146  BP: 132/76   (!) 116/53  Pulse: 83  86 78  Resp:    18  Temp:      TempSrc:      SpO2: 100%  100% 100%  Weight:  70.7 kg    Height:   5\' 2"  (1.575 m)       . General:  Appears calm and comfortable and is NAD . Eyes:  PERRL, EOMI, normal lids, iris . ENT:  grossly normal hearing, lips & tongue, mmm . Neck:  no LAD, masses or thyromegaly . Cardiovascular:  RRR, no r/g, +murmur, click. No LE edema.  Marland Kitchen Respiratory:   CTA bilaterally with no wheezes/rales/rhonchi.  Normal respiratory effort. . Abdomen:  soft, NT, ND, NABS . Skin:  no rash or induration seen on limited exam . Musculoskeletal:  grossly normal tone BUE/BLE, good ROM, no bony abnormality . Psychiatric:  grossly normal mood and affect, speech fluent and appropriate, AOx3 . Neurologic:  CN 2-12 grossly intact, moves all extremities in coordinated fashion    Radiological Exams on Admission: No results found.  EKG: not done   Labs on Admission: I have personally reviewed the available labs and imaging studies at the time of the admission.  Pertinent labs:   K+ 3.3 Glucose 137 WBC 5.9 Hgb 11.2 COVID pending   Assessment/Plan Principal Problem:   Acute GI bleeding Active Problems:   History of aortic valve replacement with bioprosthetic valve   Essential hypertension   GI Bleeding -Patient with prior h/o duodenal ulcer with bleeding presenting with BRBPR with prior h/o several weeks of possible intermittent tarry stools -While this could be a lower GI bleed, given prior h/o similar presentation, this also could be a brisk, painless upper bleed such as from a recurrent duodenal ulcer -She is currently hemodynamically stable with normal Hgb; will recheck Hgb at 1600 -Observation on telemetry for now -Continue to monitor for recurrent bleeding  -GI has been consulted and plans to prep overnight and scope from above and below tomorrow -Will give IV Protonix BID  HTN -Continue Norvasc, Hyzaar, resuming tomorrow -Continue Toprol, resuming today  H/o AVR -no longer on [facility] or ASA   Note: This patient has been tested and is pending for the novel  coronavirus COVID-19.  DVT prophylaxis:  SCDs Code Status:  Full - confirmed with patient Family Communication: None present Disposition Plan:  Home once clinically improved Consults called: GI Admission status: It is my clinical opinion that referral for OBSERVATION is reasonable and necessary in this patient based on the above information provided. The aforementioned taken together are felt to place the patient at high risk for further clinical deterioration. However it is anticipated that the patient may be medically stable for discharge from the hospital within 24 to 48 hours.     Karmen Bongo MD [facility]   How to contact the [facility] Psychiatric Attending or Consulting provider [place] or covering provider during after hours [place], for this patient?  1. Check the care team in [facility] and look for a) attending/consulting [facility] provider listed and b) the [facility] team listed 2. Log into [email or web address] and use [place]'s universal password to access. If you do not have the password, please contact the hospital operator. 3. Locate the [facility] provider you are looking for under [facility] and page to a number that you can be directly reached. 4. If you still have difficulty reaching the provider, please page the [facility] (Director on Call) for the Hospitalists listed on amion for assistance.   02/17/2019, 12:02 PM

## 2019-02-17 NOTE — ED Provider Notes (Signed)
[place] EMERGENCY DEPARTMENT Provider Note   CSN: [id number] Arrival date & time: 02/17/19  [id number]     History Chief Complaint  Patient presents with  . Rectal Bleeding    [name] is a 69 y.o. female w PMHx gastritis, duodenal ulcer, HTN, aortic valve replacement, presenting to the ED with complaint of rectal bleeding. She reports it is dark red in color that began around 3:30am this morning. She has had at least 5 episodes of this, where she states she is not stooling, but passing blood. She had one just prior to evaluation in the ED and reports it looks more like clotted blood. Denies nausea, vomiting, HA, F/C, constipation, urinary sx, LH, SOB.  Hx of GI bleed in 2019 due to duodenal ulcers. She has not been taking anticoagulants or NSAIDs since that time. She is waiting for a new patient appt at [facility], however is currently being followed by PCP. She takes protonix daily.  The history is provided by the patient and medical records.       Past Medical History:  Diagnosis Date  . Acute lower GI bleeding 02/17/2019  . Hyperplastic colon polyp 2009  . Hypertension   . Melanoma ([place]) 5-6 yrs ago  . Raynaud's disease   . S/P AVR   . Sciatic nerve disease, left     Patient Active Problem List   Diagnosis Date Noted  . Acute GI bleeding 02/17/2019  . Preoperative cardiovascular examination 09/16/2017  . Lower GI bleed 09/16/2017  . History of aortic valve replacement with bioprosthetic valve 06/14/2013  . Essential hypertension 06/14/2013  . Raynaud's phenomenon 06/14/2013    Past Surgical History:  Procedure Laterality Date  . ABDOMINAL HYSTERECTOMY  yrs ago  . BILATERAL OOPHORECTOMY  2008  . BREAST EXCISIONAL BIOPSY Right   . CARDIAC VALVE REPLACEMENT  7 yrs ago   aortic bovine valeve replave replacement   . COLONOSCOPY WITH PROPOFOL N/A 07/10/2016   Procedure: COLONOSCOPY WITH PROPOFOL;  Surgeon: Garlan Fair, MD;  Location: [facility]  ENDOSCOPY;  Service: Endoscopy;  Laterality: N/A;  . EYE SURGERY Bilateral    ioc for cataracts     OB History   No obstetric history on file.     Family History  Problem Relation Age of Onset  . Heart failure Mother   . Breast cancer Mother        in 4's  . Cancer Father   . Hypertension Father   . Healthy Sister   . Hypertension Brother   . Hypertension Sister   . Osteoporosis Sister   . Hypertension Sister     Social History   Tobacco Use  . Smoking status: Former Smoker    Packs/day: 1.00    Years: 40.00    Pack years: 40.00    Types: Cigarettes    Quit date: 2005    Years since quitting: 16.0  . Smokeless tobacco: Never Used  Substance Use Topics  . Alcohol use: Yes    Alcohol/week: 0.0 standard drinks    Comment: 1 glass wine per day  . Drug use: No    Home Medications Prior to Admission medications   Medication Sig Start Date End Date Taking? Authorizing Provider  amLODipine (NORVASC) 5 MG tablet Take 1 tablet (5 mg total) by mouth daily. 09/30/18  Yes Belva Crome, MD  cephALEXin (KEFLEX) 500 MG capsule Take 2,000 mg by mouth as needed (Prior to [place]).  Yes [provider]  cholecalciferol (VITAMIN D) 1000 UNITS tablet Take 1,000 Units by mouth daily.   Yes [provider]  fluticasone (FLONASE) 50 MCG/ACT nasal spray Place 2 sprays into the nose daily. Only during allergy season   Yes [provider]  losartan-hydrochlorothiazide (HYZAAR) 100-25 MG tablet TAKE 1 TABLET BY MOUTH EVERY DAY Patient taking differently: Take 1 tablet by mouth daily.  12/01/18  Yes Belva Crome, MD  metoprolol succinate (TOPROL-XL) 25 MG 24 hr tablet TAKE 1 TABLET BY MOUTH EVERY DAY Patient taking differently: Take 25 mg by mouth daily.  12/01/18  Yes Belva Crome, MD  pantoprazole (PROTONIX) 40 MG tablet Take 40 mg by mouth 2 (two) times daily.  08/28/17  Yes [provider]    Allergies    Ace inhibitors,  Sulfa antibiotics, and Amlodipine  Review of Systems   Review of Systems  Constitutional: Negative for fatigue.  Respiratory: Negative for shortness of breath.   Gastrointestinal: Negative for abdominal pain, constipation, diarrhea, nausea and vomiting.  Neurological: Negative for light-headedness.  Hematological: Does not bruise/bleed easily.  All other systems reviewed and are negative.   Physical Exam Updated Vital Signs BP 122/71 (BP Location: Left Arm)   Pulse 76   Temp 98.3 F (36.8 C) (Oral)   Resp 18   Ht 5' 3.5" (1.613 m)   Wt 70.8 kg   SpO2 98%   BMI 27.22 kg/m   Physical Exam Vitals and nursing note reviewed. Exam conducted with a chaperone present.  Constitutional:      General: She is not in acute distress.    Appearance: She is well-developed. She is not ill-appearing.  HENT:     Head: Normocephalic and atraumatic.  Eyes:     Conjunctiva/sclera: Conjunctivae normal.  Cardiovascular:     Rate and Rhythm: Normal rate and regular rhythm.  Pulmonary:     Effort: Pulmonary effort is normal. No respiratory distress.     Breath sounds: Normal breath sounds.  Abdominal:     General: Bowel sounds are normal.     Palpations: Abdomen is soft.     Tenderness: There is no abdominal tenderness. There is no guarding or rebound.  Genitourinary:    Comments: Rectal exam performed, no hemorrhoids or tenderness. Gross dark red blood  Skin:    General: Skin is warm.  Neurological:     Mental Status: She is alert.  Psychiatric:        Behavior: Behavior normal.     ED Results / Procedures / Treatments   Labs (all labs ordered are listed, but only abnormal results are displayed) Labs Reviewed  COMPREHENSIVE METABOLIC PANEL - Abnormal; Notable for the following components:      Result Value   Potassium 3.3 (*)    Glucose, Bld 137 (*)    Total Protein 6.1 (*)    All other components within normal limits  CBC - Abnormal; Notable for the following components:    RBC 3.61 (*)    Hemoglobin 11.2 (*)    HCT 35.0 (*)    All other components within normal limits  SARS CORONAVIRUS 2 (TAT 6-24 HRS)  HIV ANTIBODY (ROUTINE TESTING W REFLEX)  CBC  POC OCCULT BLOOD, ED  TYPE AND SCREEN    EKG None  Radiology No results found.  Procedures Procedures (including critical care time)  Medications Ordered in ED Medications  polyethylene glycol-electrolytes (NuLYTELY) solution 4,000 mL (has no administration in time range)  pantoprazole (PROTONIX)  injection 40 mg (40 mg Intravenous Not Given 02/17/19 1338)  amLODipine (NORVASC) tablet 5 mg (5 mg Oral Not Given 02/17/19 1339)  metoprolol succinate (TOPROL-XL) 24 hr tablet 25 mg (has no administration in time range)  acetaminophen (TYLENOL) tablet 650 mg (has no administration in time range)    Or  acetaminophen (TYLENOL) suppository 650 mg (has no administration in time range)  ondansetron (ZOFRAN) tablet 4 mg (has no administration in time range)    Or  ondansetron (ZOFRAN) injection 4 mg (has no administration in time range)  sodium chloride flush (NS) 0.9 % injection 3 mL (3 mLs Intravenous Given 02/17/19 1322)  lactated ringers infusion ( Intravenous Rate/Dose Verify 02/17/19 1400)  losartan (COZAAR) tablet 100 mg (100 mg Oral Not Given 02/17/19 1338)    And  hydrochlorothiazide (HYDRODIURIL) tablet 25 mg (25 mg Oral Not Given 02/17/19 1339)  pantoprazole (PROTONIX) injection 40 mg (40 mg Intravenous Given 02/17/19 1149)    ED Course  I have reviewed the triage vital signs and the nursing notes.  Pertinent labs & imaging results that were available during my care of the patient were reviewed by me and considered in my medical decision making (see chart for details).  Clinical Course as of Feb 16 1601  Wed Feb 17, 2019  0953 Dr. Luan Pulling recommending hospital admission and clear liquid diet. GI to consult.   [JR]  39 Dr. Lorin Mercy accepting admission.   [JR]    Clinical Course User Index [JR]  Shatona Andujar, [place] N, PA-C   MDM Rules/Calculators/A&P                      Patient with history of duodenal ulcers and GI bleed, presenting with rectal bleeding that began around 3:30 AM this morning.  Patient is passing gross dark red blood per rectum, multiple episodes throughout the morning.  She is currently hemodynamically stable in the ED with hemoglobin of 11.2 though given patient's active bleed and gross blood on rectal exam, GI consulted.  Dr. Alessandra Bevels with [facility] recommending admission and clear liquid diet.  Dr. Lorin Mercy accepting admission.  Final Clinical Impression(s) / ED Diagnoses Final diagnoses:  Rectal bleeding    Rx / DC Orders ED Discharge Orders    None       Isom Kochan, [place] N, PA-C 02/17/19 1603    Lucrezia Starch, MD 02/18/19 1054

## 2019-02-18 DIAGNOSIS — K625 Hemorrhage of anus and rectum: Secondary | ICD-10-CM | POA: Diagnosis present

## 2019-02-18 LAB — BASIC METABOLIC PANEL
Anion gap: 9 (ref 5–15)
BUN: 17 mg/dL (ref 8–23)
CO2: 26 mmol/L (ref 22–32)
Calcium: 8.7 mg/dL — ABNORMAL LOW (ref 8.9–10.3)
Chloride: 107 mmol/L (ref 98–111)
Creatinine, Ser: 0.79 mg/dL (ref 0.44–1.00)
GFR calc Af Amer: >60 mL/min (ref >60)
GFR calc non Af Amer: >60 mL/min (ref >60)
Glucose, Bld: 119 mg/dL — ABNORMAL HIGH (ref 70–99)
Potassium: 4.4 mmol/L (ref 3.5–5.1)
Sodium: 142 mmol/L (ref 135–145)

## 2019-02-18 LAB — CBC
HCT: 25.1 % — ABNORMAL LOW (ref 36.0–46.0)
HCT: 27.7 % — ABNORMAL LOW (ref 36.0–46.0)
Hemoglobin: 8.2 g/dL — ABNORMAL LOW (ref 12.0–15.0)
Hemoglobin: 9 g/dL — ABNORMAL LOW (ref 12.0–15.0)
MCH: 30.7 pg (ref 26.0–34.0)
MCH: 30.6 pg (ref 26.0–34.0)
MCHC: 32.7 g/dL (ref 30.0–36.0)
MCHC: 32.5 g/dL (ref 30.0–36.0)
MCV: 94 fL (ref 80.0–100.0)
MCV: 94.2 fL (ref 80.0–100.0)
Platelets: 232 10*3/uL (ref 150–400)
Platelets: 275 10*3/uL (ref 150–400)
RBC: 2.67 MIL/uL — ABNORMAL LOW (ref 3.87–5.11)
RBC: 2.94 MIL/uL — ABNORMAL LOW (ref 3.87–5.11)
RDW: 12.1 % (ref 11.5–15.5)
RDW: 12.1 % (ref 11.5–15.5)
WBC: 6.8 10*3/uL (ref 4.0–10.5)
WBC: 7.7 10*3/uL (ref 4.0–10.5)
nRBC: 0 % (ref 0.0–0.2)
nRBC: 0 % (ref 0.0–0.2)

## 2019-02-18 MED ORDER — POLYETHYLENE GLYCOL 3350 17 GM/SCOOP PO POWD
1.0000 | Freq: Once | ORAL | Status: AC
  Administered 2019-02-18: 255 g via ORAL
  Filled 2019-02-18: qty 255

## 2019-02-18 MED ORDER — MAGNESIUM CITRATE PO SOLN
1.0000 | Freq: Once | ORAL | Status: AC
  Administered 2019-02-18: 1 via ORAL
  Filled 2019-02-18: qty 296

## 2019-02-18 MED ORDER — MAGNESIUM CITRATE PO SOLN: 1.0000 | Freq: Once | ORAL | Status: DC

## 2019-02-18 NOTE — Plan of Care (Signed)
°  Problem: Coping: °Goal: Level of anxiety will decrease °Outcome: Progressing °  °

## 2019-02-18 NOTE — H&P (View-Only) (Signed)
Subjective: Patient reports nausea and vomiting within 10 minutes of drinking one fourth of a gallon of the prep yesterday evening. She denies any further nausea and was able to tolerate beef broth and water.  She has not had any bowel movement since admission. She has had multiple colonoscopies in the past and has never had an intolerance to colonic prep and is requesting for a different prep for anticipated procedure tomorrow. Normally patient reports a bowel movement every day or every other day denies history of constipation.  Objective: Vital signs in last 24 hours: Temp:  [97.6 F (36.4 C)-100 F (37.8 C)] 100 F (37.8 C) (01/21 0903) Pulse Rate:  [71-85] 75 (01/21 0903) Resp:  [16-18] 16 (01/21 0903) BP: (105-134)/(53-109) 111/63 (01/21 0903) SpO2:  [92 %-100 %] 96 % (01/21 0903) Weight:  [70.8 kg] 70.8 kg (01/20 2040) Weight change:  Last BM Date: 02/16/19  PE: Not in distress, sitting on the edge of the bed GENERAL: Mild pallor ABDOMEN: Soft, nondistended, nontender EXTREMITIES: No edema, no deformity, bilateral chronic skin changes  Lab Results: Results for orders placed or performed during the hospital encounter of 02/17/19 (from the past 48 hour(s))  Type and screen [place]     Status: None   Collection Time: 02/17/19  4:02 AM  Result Value Ref Range   ABO/RH(D) O POS    Antibody Screen NEG    Sample Expiration      02/20/2019,2359 Performed at [facility], [address] [place], [place] [address]   Comprehensive metabolic panel     Status: Abnormal   Collection Time: 02/17/19  4:23 AM  Result Value Ref Range   Sodium 138 135 - 145 mmol/L   Potassium 3.3 (L) 3.5 - 5.1 mmol/L   Chloride 102 98 - 111 mmol/L   CO2 24 22 - 32 mmol/L   Glucose, Bld 137 (H) 70 - 99 mg/dL   BUN 21 8 - 23 mg/dL   Creatinine, Ser 0.84 0.44 - 1.00 mg/dL   Calcium 9.2 8.9 - 10.3 mg/dL   Total Protein 6.1 (L) 6.5 - 8.1 g/dL   Albumin 3.5 3.5 - 5.0 g/dL   AST 20 15 - 41 U/L   ALT 17 0 - 44 U/L   Alkaline Phosphatase 94 38 - 126 U/L   Total Bilirubin 0.8 0.3 - 1.2 mg/dL   GFR calc non Af Amer >60 >60 mL/min   GFR calc Af Amer >60 >60 mL/min   Anion gap 12 5 - 15    Comment: Performed at [facility], [place] [address], [place], [place] [address]  CBC     Status: Abnormal   Collection Time: 02/17/19  4:23 AM  Result Value Ref Range   WBC 5.9 4.0 - 10.5 K/uL   RBC 3.61 (L) 3.87 - 5.11 MIL/uL   Hemoglobin 11.2 (L) 12.0 - 15.0 g/dL   HCT 35.0 (L) 36.0 - 46.0 %   MCV 97.0 80.0 - 100.0 fL   MCH 31.0 26.0 - 34.0 pg   MCHC 32.0 30.0 - 36.0 g/dL   RDW 12.0 11.5 - 15.5 %   Platelets 336 150 - 400 K/uL   nRBC 0.0 0.0 - 0.2 %    Comment: Performed at [facility], [place] [address], [place], [place] [address]  SARS CORONAVIRUS 2 (TAT 6-24 HRS) Nasopharyngeal Nasopharyngeal Swab     Status: None   Collection Time: 02/17/19 10:10 AM   Specimen: Nasopharyngeal  Swab  Result Value Ref Range   SARS Coronavirus 2 NEGATIVE NEGATIVE    Comment: (NOTE) SARS-CoV-2 target nucleic acids are NOT DETECTED. The SARS-CoV-2 RNA is generally detectable in upper and lower respiratory specimens during the acute phase of infection. Negative results do not preclude SARS-CoV-2 infection, do not rule out co-infections with other pathogens, and should not be used as the sole basis for treatment or other patient management decisions. Negative results must be combined with clinical observations, patient history, and epidemiological information. The expected result is Negative. Fact Sheet for Patients: SugarRoll.be Fact Sheet for Healthcare Providers: https://www.woods-mathews.com/ This test is not yet approved or cleared by the [place] FDA and  has been authorized for detection and/or diagnosis of SARS-CoV-2 by FDA under an Emergency Use Authorization (EUA). This EUA will remain  in effect (meaning this test can  be used) for the duration of the COVID-19 declaration under Section 56 4(b)(1) of the Act, 21 U.S.C. section 360bbb-3(b)(1), unless the authorization is terminated or revoked sooner. Performed at [facility], [place] [address], [place], [place] [address]   HIV Antibody (routine testing w rflx)     Status: None   Collection Time: 02/17/19  4:34 PM  Result Value Ref Range   HIV Screen 4th Generation wRfx NON REACTIVE NON REACTIVE    Comment: Performed at [facility] [address] [place], [place] [address]  CBC     Status: Abnormal   Collection Time: 02/17/19  4:34 PM  Result Value Ref Range   WBC 7.2 4.0 - 10.5 K/uL   RBC 3.35 (L) 3.87 - 5.11 MIL/uL   Hemoglobin 10.3 (L) 12.0 - 15.0 g/dL   HCT 31.4 (L) 36.0 - 46.0 %   MCV 93.7 80.0 - 100.0 fL   MCH 30.7 26.0 - 34.0 pg   MCHC 32.8 30.0 - 36.0 g/dL   RDW 12.0 11.5 - 15.5 %   Platelets 279 150 - 400 K/uL   nRBC 0.0 0.0 - 0.2 %    Comment: Performed at [facility], [place] [address], [place],  [id number]  Basic metabolic panel     Status: Abnormal   Collection Time: 02/18/19  2:46 AM  Result Value Ref Range   Sodium 142 135 - 145 mmol/L   Potassium 4.4 3.5 - 5.1 mmol/L   Chloride 107 98 - 111 mmol/L   CO2 26 22 - 32 mmol/L   Glucose, Bld 119 (H) 70 - 99 mg/dL   BUN 17 8 - 23 mg/dL   Creatinine, Ser 0.79 0.44 - 1.00 mg/dL   Calcium 8.7 (L) 8.9 - 10.3 mg/dL   GFR calc non Af Amer >60 >60 mL/min   GFR calc Af Amer >60 >60 mL/min   Anion gap 9 5 - 15    Comment: Performed at [facility] [address] [place], [place] [address]  CBC     Status: Abnormal   Collection Time: 02/18/19  2:46 AM  Result Value Ref Range   WBC 6.8 4.0 - 10.5 K/uL   RBC 2.67 (L) 3.87 - 5.11 MIL/uL   Hemoglobin 8.2 (L) 12.0 - 15.0 g/dL   HCT 25.1 (L) 36.0 - 46.0 %   MCV 94.0 80.0 - 100.0 fL   MCH 30.7 26.0 - 34.0 pg   MCHC 32.7 30.0 - 36.0 g/dL   RDW 12.1 11.5 - 15.5 %   Platelets 232 150 - 400 K/uL   nRBC 0.0 0.0 -  0.2 %    Comment: Performed at [facility], [place] [address], [place], [place] [address]    Studies/Results: No results found.  Medications: I have reviewed the patient's current medications.  Assessment: Painless hematochezia, history of right colonic AVM and left colonic diverticulosis History of esophagitis, duodenitis, EGD in 2019 in [place], required two units PRBC transfusion at that point. Currently on pantoprazole 40 mg daily. Remains hemodynamically stable(T-max 100 F), hemoglobin has dropped from 11.2 on admission to 8.2 today. BUN/creatinine ratio remains normal at 17/0.79. SARS Coronavirus 2 NEGATIVE     Plan: Continue clear liquid diet. Magnesium citrate 300 mL p.o. x1 dose now, then another 300 mL p.o. x1 dose at 2 PM. Thereafter will give MiraLAX 255 g at 4 PM. Advised patient to drink plenty of fluids. Monitor H&H with plans to transfuse if hemoglobin is less than 7. Colonoscopy with possible EGD rescheduled for tomorrow morning with Dr. Alessandra Bevels.  Ronnette Juniper, MD 02/18/2019, 11:36 AM

## 2019-02-18 NOTE — Progress Notes (Signed)
PROGRESS NOTE    TAFFY HYPPOLITE  [id number] DOB: 12-29-1950 DOA: 02/17/2019 PCP: Lavone Orn, MD   Brief Narrative:  HPI: [name] is a 69 y.o. female with medical history significant of s/p AVR not on AC;  and HTN presenting with rectal bleeding.  She reports that a year and a half ago she had a very bad GI bleed; they thought it was a duodenal ulcer they saw on EGD.  This happened in [place], [place]; she was admitted and transfused 2 unit PRBC.  She was not scoped while in the hospital.  No problems since then.  She started right before Thanksgiving with ?blood in stools.  She would have intermittent black stools.  Today, she had 5 episodes of BRPPR.  There was clearly blood in the commode.  She denies pain, a "little bit of a gurgle."  This is similar to her episode a year and a half ago.  She did not have a c-scope because she had had a prior c-scope with polyps.  She is not light-headed or dizzy.  Her last episode of bleeding was in the ER with clots.  ED Course:  H/o bleeding duondenal ulcer requiring transfusion in 2019, presenting with BRBPR today.  Hgb stable but multiple episodes of gross blood today.  No NSAIDs.  Dr. Alessandra Bevels recommends admission, clear liquids.  Assessment & Plan:   Principal Problem:   Acute GI bleeding Active Problems:   History of aortic valve replacement with bioprosthetic valve   Essential hypertension   Rectal bleeding/lower GI bleeding/acute blood loss anemia: Per patient, her last rectal bleeding was yesterday morning in the emergency department.  She has not had any bowel movement since then.  Her hemoglobin dropped from 11.2 to 8.2.  Will recheck later today.  No indication of transfusion right now.  Will transfuse if drops less than 7.  Patient could not tolerate GI prep.  Colonoscopy and EGD scheduled for tomorrow.  Appreciate GI help.  Continue PPI.  Essential hypertension: Controlled.  Continue Norvasc, Hyzaar, Toprol.  H/o AVR -no  longer on AC or ASA  DVT prophylaxis: SCD Code Status: Full code Family Communication:  None present at bedside.  Plan of care discussed with patient in length and he verbalized understanding and agreed with it. Disposition Plan: May discharge home tomorrow after colonoscopy and EGD and GI signing off.  Estimated body mass index is 27.22 kg/m as calculated from the following:   Height as of this encounter: 5' 3.5" (1.613 m).   Weight as of this encounter: 70.8 kg.      Nutritional status:               Consultants:   GI  Procedures:   None  Antimicrobials:   None   Subjective: Seen and examined.  Feels better.  No rectal bleeding for more than 24 hours now.  No other complaint.  Objective: Vitals:   02/17/19 1354 02/17/19 2040 02/18/19 0458 02/18/19 0903  BP: 122/71 (!) 134/109 113/62 111/63  Pulse: 76 85 71 75  Resp: 18 17 17 16   Temp: 98.3 F (36.8 C) 97.6 F (36.4 C) 98.9 F (37.2 C) 100 F (37.8 C)  TempSrc: Oral Oral Oral Oral  SpO2: 98% 95% 92% 96%  Weight:  70.8 kg    Height:        Intake/Output Summary (Last 24 hours) at 02/18/2019 1439 Last data filed at 02/18/2019 0900 Gross per 24 hour  Intake 1760.46 ml  Output  0 ml  Net 1760.46 ml   Filed Weights   02/17/19 1011 02/17/19 1300 02/17/19 2040  Weight: 70.7 kg 70.8 kg 70.8 kg    Examination:  General exam: Appears calm and comfortable  Respiratory system: Clear to auscultation. Respiratory effort normal. Cardiovascular system: S1 & S2 heard, RRR. No JVD, murmurs, rubs, gallops or clicks. No pedal edema. Gastrointestinal system: Abdomen is nondistended, soft and nontender. No organomegaly or masses felt. Normal bowel sounds heard. Central nervous system: Alert and oriented. No focal neurological deficits. Extremities: Symmetric 5 x 5 power. Skin: No rashes, lesions or ulcers Psychiatry: Judgement and insight appear normal. Mood & affect appropriate.    Data Reviewed: I have  personally reviewed following labs and imaging studies  CBC: Recent Labs  Lab 02/17/19 0423 02/17/19 1634 02/18/19 0246  WBC 5.9 7.2 6.8  HGB 11.2* 10.3* 8.2*  HCT 35.0* 31.4* 25.1*  MCV 97.0 93.7 94.0  PLT 336 279 [id number]   Basic Metabolic Panel: Recent Labs  Lab 02/17/19 0423 02/18/19 0246  NA 138 142  K 3.3* 4.4  CL 102 107  CO2 24 26  GLUCOSE 137* 119*  BUN 21 17  CREATININE 0.84 0.79  CALCIUM 9.2 8.7*   GFR: Estimated Creatinine Clearance: 63.4 mL/min (by C-G formula based on SCr of 0.79 mg/dL). Liver Function Tests: Recent Labs  Lab 02/17/19 0423  AST 20  ALT 17  ALKPHOS 94  BILITOT 0.8  PROT 6.1*  ALBUMIN 3.5   No results for input(s): LIPASE, AMYLASE in the last 168 hours. No results for input(s): AMMONIA in the last 168 hours. Coagulation Profile: No results for input(s): INR, PROTIME in the last 168 hours. Cardiac Enzymes: No results for input(s): CKTOTAL, CKMB, CKMBINDEX, TROPONINI in the last 168 hours. BNP (last 3 results) No results for input(s): PROBNP in the last 8760 hours. HbA1C: No results for input(s): HGBA1C in the last 72 hours. CBG: No results for input(s): GLUCAP in the last 168 hours. Lipid Profile: No results for input(s): CHOL, HDL, LDLCALC, TRIG, CHOLHDL, LDLDIRECT in the last 72 hours. Thyroid Function Tests: No results for input(s): TSH, T4TOTAL, FREET4, T3FREE, THYROIDAB in the last 72 hours. Anemia Panel: No results for input(s): VITAMINB12, FOLATE, FERRITIN, TIBC, IRON, RETICCTPCT in the last 72 hours. Sepsis Labs: No results for input(s): PROCALCITON, LATICACIDVEN in the last 168 hours.  Recent Results (from the past 240 hour(s))  SARS CORONAVIRUS 2 (TAT 6-24 HRS) Nasopharyngeal Nasopharyngeal Swab     Status: None   Collection Time: 02/17/19 10:10 AM   Specimen: Nasopharyngeal Swab  Result Value Ref Range Status   SARS Coronavirus 2 NEGATIVE NEGATIVE Final    Comment: (NOTE) SARS-CoV-2 target nucleic acids are NOT  DETECTED. The SARS-CoV-2 RNA is generally detectable in upper and lower respiratory specimens during the acute phase of infection. Negative results do not preclude SARS-CoV-2 infection, do not rule out co-infections with other pathogens, and should not be used as the sole basis for treatment or other patient management decisions. Negative results must be combined with clinical observations, patient history, and epidemiological information. The expected result is Negative. Fact Sheet for Patients: SugarRoll.be Fact Sheet for Healthcare Providers: https://www.woods-mathews.com/ This test is not yet approved or cleared by the [place] FDA and  has been authorized for detection and/or diagnosis of SARS-CoV-2 by FDA under an Emergency Use Authorization (EUA). This EUA will remain  in effect (meaning this test can be used) for the duration of the COVID-19 declaration under Section 56  4(b)(1) of the Act, 21 U.S.C. section 360bbb-3(b)(1), unless the authorization is terminated or revoked sooner. Performed at [facility], [place] [address], [place], [place] [address]       Radiology Studies: No results found.  Scheduled Meds: . amLODipine  5 mg Oral Daily  . losartan  100 mg Oral Daily   And  . hydrochlorothiazide  25 mg Oral Daily  . metoprolol succinate  25 mg Oral Daily  . pantoprazole (PROTONIX) IV  40 mg Intravenous Daily  . polyethylene glycol powder  1 Container Oral Once  . sodium chloride flush  3 mL Intravenous Q12H   Continuous Infusions: . lactated ringers 75 mL/hr at 02/18/19 1406     LOS: 0 days   Time spent: 33 minutes  Darliss Cheney, MD [facility]  02/18/2019, 2:39 PM   To contact the attending provider between 7A-7P or the covering provider during after hours 7P-7A, please log into the web site [email or web address].

## 2019-02-18 NOTE — Progress Notes (Addendum)
Subjective: Patient reports nausea and vomiting within 10 minutes of drinking one fourth of a gallon of the prep yesterday evening. She denies any further nausea and was able to tolerate beef broth and water.  She has not had any bowel movement since admission. She has had multiple colonoscopies in the past and has never had an intolerance to colonic prep and is requesting for a different prep for anticipated procedure tomorrow. Normally patient reports a bowel movement every day or every other day denies history of constipation.  Objective: Vital signs in last 24 hours: Temp:  [97.6 F (36.4 C)-100 F (37.8 C)] 100 F (37.8 C) (01/21 0903) Pulse Rate:  [71-85] 75 (01/21 0903) Resp:  [16-18] 16 (01/21 0903) BP: (105-134)/(53-109) 111/63 (01/21 0903) SpO2:  [92 %-100 %] 96 % (01/21 0903) Weight:  [70.8 kg] 70.8 kg (01/20 2040) Weight change:  Last BM Date: 02/16/19  PE: Not in distress, sitting on the edge of the bed GENERAL: Mild pallor ABDOMEN: Soft, nondistended, nontender EXTREMITIES: No edema, no deformity, bilateral chronic skin changes  Lab Results: Results for orders placed or performed during the hospital encounter of 02/17/19 (from the past 48 hour(s))  Type and screen [place]     Status: None   Collection Time: 02/17/19  4:02 AM  Result Value Ref Range   ABO/RH(D) O POS    Antibody Screen NEG    Sample Expiration      02/20/2019,2359 Performed at [facility], [address], [place], [place] [address]   Comprehensive metabolic panel     Status: Abnormal   Collection Time: 02/17/19  4:23 AM  Result Value Ref Range   Sodium 138 135 - 145 mmol/L   Potassium 3.3 (L) 3.5 - 5.1 mmol/L   Chloride 102 98 - 111 mmol/L   CO2 24 22 - 32 mmol/L   Glucose, Bld 137 (H) 70 - 99 mg/dL   BUN 21 8 - 23 mg/dL   Creatinine, Ser 0.84 0.44 - 1.00 mg/dL   Calcium 9.2 8.9 - 10.3 mg/dL   Total Protein 6.1 (L) 6.5 - 8.1 g/dL   Albumin 3.5 3.5 - 5.0 g/dL   AST 20 15 - 41 U/L   ALT 17 0 - 44 U/L   Alkaline Phosphatase 94 38 - 126 U/L   Total Bilirubin 0.8 0.3 - 1.2 mg/dL   GFR calc non Af Amer >60 >60 mL/min   GFR calc Af Amer >60 >60 mL/min   Anion gap 12 5 - 15    Comment: Performed at [facility], [place] [address] [place], [place] [address]  CBC     Status: Abnormal   Collection Time: 02/17/19  4:23 AM  Result Value Ref Range   WBC 5.9 4.0 - 10.5 K/uL   RBC 3.61 (L) 3.87 - 5.11 MIL/uL   Hemoglobin 11.2 (L) 12.0 - 15.0 g/dL   HCT 35.0 (L) 36.0 - 46.0 %   MCV 97.0 80.0 - 100.0 fL   MCH 31.0 26.0 - 34.0 pg   MCHC 32.0 30.0 - 36.0 g/dL   RDW 12.0 11.5 - 15.5 %   Platelets 336 150 - 400 K/uL   nRBC 0.0 0.0 - 0.2 %    Comment: Performed at [facility], [place] [address], [place], [place] [address]  SARS CORONAVIRUS 2 (TAT 6-24 HRS) Nasopharyngeal Nasopharyngeal Swab     Status: None   Collection Time: 02/17/19 10:10 AM   Specimen: Nasopharyngeal  Swab  Result Value Ref Range   SARS Coronavirus 2 NEGATIVE NEGATIVE    Comment: (NOTE) SARS-CoV-2 target nucleic acids are NOT DETECTED. The SARS-CoV-2 RNA is generally detectable in upper and lower respiratory specimens during the acute phase of infection. Negative results do not preclude SARS-CoV-2 infection, do not rule out co-infections with other pathogens, and should not be used as the sole basis for treatment or other patient management decisions. Negative results must be combined with clinical observations, patient history, and epidemiological information. The expected result is Negative. Fact Sheet for Patients: SugarRoll.be Fact Sheet for Healthcare Providers: https://www.woods-mathews.com/ This test is not yet approved or cleared by the [place] FDA and  has been authorized for detection and/or diagnosis of SARS-CoV-2 by FDA under an Emergency Use Authorization (EUA). This EUA will remain  in effect (meaning this test can  be used) for the duration of the COVID-19 declaration under Section 56 4(b)(1) of the Act, 21 U.S.C. section 360bbb-3(b)(1), unless the authorization is terminated or revoked sooner. Performed at [facility], [place] [address], [place], [place] [address]   HIV Antibody (routine testing w rflx)     Status: None   Collection Time: 02/17/19  4:34 PM  Result Value Ref Range   HIV Screen 4th Generation wRfx NON REACTIVE NON REACTIVE    Comment: Performed at [facility] [address], [place], [place] [address]  CBC     Status: Abnormal   Collection Time: 02/17/19  4:34 PM  Result Value Ref Range   WBC 7.2 4.0 - 10.5 K/uL   RBC 3.35 (L) 3.87 - 5.11 MIL/uL   Hemoglobin 10.3 (L) 12.0 - 15.0 g/dL   HCT 31.4 (L) 36.0 - 46.0 %   MCV 93.7 80.0 - 100.0 fL   MCH 30.7 26.0 - 34.0 pg   MCHC 32.8 30.0 - 36.0 g/dL   RDW 12.0 11.5 - 15.5 %   Platelets 279 150 - 400 K/uL   nRBC 0.0 0.0 - 0.2 %    Comment: Performed at [facility], [place] [address], [place], [place] [id number]  Basic metabolic panel     Status: Abnormal   Collection Time: 02/18/19  2:46 AM  Result Value Ref Range   Sodium 142 135 - 145 mmol/L   Potassium 4.4 3.5 - 5.1 mmol/L   Chloride 107 98 - 111 mmol/L   CO2 26 22 - 32 mmol/L   Glucose, Bld 119 (H) 70 - 99 mg/dL   BUN 17 8 - 23 mg/dL   Creatinine, Ser 0.79 0.44 - 1.00 mg/dL   Calcium 8.7 (L) 8.9 - 10.3 mg/dL   GFR calc non Af Amer >60 >60 mL/min   GFR calc Af Amer >60 >60 mL/min   Anion gap 9 5 - 15    Comment: Performed at [facility] [address] [place], [place] [address]  CBC     Status: Abnormal   Collection Time: 02/18/19  2:46 AM  Result Value Ref Range   WBC 6.8 4.0 - 10.5 K/uL   RBC 2.67 (L) 3.87 - 5.11 MIL/uL   Hemoglobin 8.2 (L) 12.0 - 15.0 g/dL   HCT 25.1 (L) 36.0 - 46.0 %   MCV 94.0 80.0 - 100.0 fL   MCH 30.7 26.0 - 34.0 pg   MCHC 32.7 30.0 - 36.0 g/dL   RDW 12.1 11.5 - 15.5 %   Platelets 232 150 - 400 K/uL   nRBC 0.0 0.0 -  0.2 %    Comment: Performed at [facility], [place] [address] [place], [place] [address]    Studies/Results: No results found.  Medications: I have reviewed the patient's current medications.  Assessment: Painless hematochezia, history of right colonic AVM and left colonic diverticulosis History of esophagitis, duodenitis, EGD in 2019 in [place], required two units PRBC transfusion at that point. Currently on pantoprazole 40 mg daily. Remains hemodynamically stable(T-max 100 F), hemoglobin has dropped from 11.2 on admission to 8.2 today. BUN/creatinine ratio remains normal at 17/0.79. SARS Coronavirus 2 NEGATIVE     Plan: Continue clear liquid diet. Magnesium citrate 300 mL p.o. x1 dose now, then another 300 mL p.o. x1 dose at 2 PM. Thereafter will give MiraLAX 255 g at 4 PM. Advised patient to drink plenty of fluids. Monitor H&H with plans to transfuse if hemoglobin is less than 7. Colonoscopy with possible EGD rescheduled for tomorrow morning with Dr. Alessandra Bevels.  Ronnette Juniper, MD 02/18/2019, 11:36 AM

## 2019-02-18 NOTE — Plan of Care (Signed)
  Problem: Education: Goal: Ability to identify signs and symptoms of gastrointestinal bleeding will improve Outcome: Progressing   

## 2019-02-19 ENCOUNTER — Encounter (HOSPITAL_COMMUNITY): Payer: Self-pay | Admitting: Family Medicine

## 2019-02-19 ENCOUNTER — Inpatient Hospital Stay (HOSPITAL_COMMUNITY): Payer: Medicare HMO | Admitting: Certified Registered Nurse Anesthetist

## 2019-02-19 ENCOUNTER — Encounter (HOSPITAL_COMMUNITY)
Admission: EM | Disposition: A | Discharge status: Home / Self Care | Payer: Self-pay | Source: Home / Self Care | Attending: Family Medicine

## 2019-02-19 HISTORY — PX: POLYPECTOMY: SHX5525

## 2019-02-19 HISTORY — PX: HOT HEMOSTASIS: SHX5433

## 2019-02-19 HISTORY — PX: COLONOSCOPY WITH PROPOFOL: SHX5780

## 2019-02-19 HISTORY — PX: BIOPSY: SHX5522

## 2019-02-19 HISTORY — PX: ESOPHAGOGASTRODUODENOSCOPY (EGD) WITH PROPOFOL: SHX5813

## 2019-02-19 HISTORY — PX: HEMOSTASIS CLIP PLACEMENT: SHX6857

## 2019-02-19 LAB — BASIC METABOLIC PANEL
Anion gap: 7 (ref 5–15)
BUN: 11 mg/dL (ref 8–23)
CO2: 27 mmol/L (ref 22–32)
Calcium: 8.2 mg/dL — ABNORMAL LOW (ref 8.9–10.3)
Chloride: 105 mmol/L (ref 98–111)
Creatinine, Ser: 0.78 mg/dL (ref 0.44–1.00)
GFR calc Af Amer: >60 mL/min (ref >60)
GFR calc non Af Amer: >60 mL/min (ref >60)
Glucose, Bld: 108 mg/dL — ABNORMAL HIGH (ref 70–99)
Potassium: 3.8 mmol/L (ref 3.5–5.1)
Sodium: 139 mmol/L (ref 135–145)

## 2019-02-19 LAB — CBC
HCT: 22.6 % — ABNORMAL LOW (ref 36.0–46.0)
HCT: 24 % — ABNORMAL LOW (ref 36.0–46.0)
Hemoglobin: 7.4 g/dL — ABNORMAL LOW (ref 12.0–15.0)
Hemoglobin: 7.6 g/dL — ABNORMAL LOW (ref 12.0–15.0)
MCH: 30.8 pg (ref 26.0–34.0)
MCH: 30.8 pg (ref 26.0–34.0)
MCHC: 32.7 g/dL (ref 30.0–36.0)
MCHC: 31.7 g/dL (ref 30.0–36.0)
MCV: 94.2 fL (ref 80.0–100.0)
MCV: 97.2 fL (ref 80.0–100.0)
Platelets: 202 10*3/uL (ref 150–400)
Platelets: 216 10*3/uL (ref 150–400)
RBC: 2.4 MIL/uL — ABNORMAL LOW (ref 3.87–5.11)
RBC: 2.47 MIL/uL — ABNORMAL LOW (ref 3.87–5.11)
RDW: 12 % (ref 11.5–15.5)
RDW: 12.2 % (ref 11.5–15.5)
WBC: 4.6 10*3/uL (ref 4.0–10.5)
WBC: 6.1 10*3/uL (ref 4.0–10.5)
nRBC: 0 % (ref 0.0–0.2)
nRBC: 0 % (ref 0.0–0.2)

## 2019-02-19 SURGERY — COLONOSCOPY WITH PROPOFOL
Anesthesia: Monitor Anesthesia Care

## 2019-02-19 MED ORDER — ONDANSETRON HCL 4 MG/2ML IJ SOLN
INTRAMUSCULAR | Status: DC | PRN
  Administered 2019-02-19: 4 mg via INTRAVENOUS

## 2019-02-19 MED ORDER — LACTATED RINGERS IV SOLN: INTRAVENOUS | Status: DC

## 2019-02-19 MED ORDER — SODIUM CHLORIDE 0.9 % IV SOLN: INTRAVENOUS | Status: DC

## 2019-02-19 MED ORDER — PROPOFOL 500 MG/50ML IV EMUL
INTRAVENOUS | Status: DC | PRN
  Administered 2019-02-19: 100 ug/kg/min via INTRAVENOUS

## 2019-02-19 MED ORDER — PROPOFOL 10 MG/ML IV BOLUS
INTRAVENOUS | Status: DC | PRN
  Administered 2019-02-19: 15 mg via INTRAVENOUS
  Administered 2019-02-19: 20 mg via INTRAVENOUS

## 2019-02-19 SURGICAL SUPPLY — 25 items

## 2019-02-19 NOTE — Anesthesia Procedure Notes (Signed)
Procedure Name: MAC Date/Time: 02/19/2019 10:18 AM Performed by: Inda Coke, CRNA Pre-anesthesia Checklist: Patient identified, Emergency Drugs available, Suction available, Timeout performed and Patient being monitored Patient Re-evaluated:Patient Re-evaluated prior to induction Oxygen Delivery Method: Nasal cannula Induction Type: IV induction Dental Injury: Teeth and Oropharynx as per pre-operative assessment

## 2019-02-19 NOTE — Plan of Care (Signed)
  Problem: Education: Goal: Ability to identify signs and symptoms of gastrointestinal bleeding will improve Outcome: Progressing   Problem: Bowel/Gastric: Goal: Will show no signs and symptoms of gastrointestinal bleeding Outcome: Progressing   Problem: Health Behavior/Discharge Planning: Goal: Ability to manage health-related needs will improve Outcome: Progressing

## 2019-02-19 NOTE — Interval H&P Note (Signed)
History and Physical Interval Note:  02/19/2019 10:14 AM  [name]  has presented today for surgery, with the diagnosis of HEMATOCHEZIA.  The various methods of treatment have been discussed with the patient and family. After consideration of risks, benefits and other options for treatment, the patient has consented to  Procedure(s): COLONOSCOPY WITH PROPOFOL (N/A) ESOPHAGOGASTRODUODENOSCOPY (EGD) WITH PROPOFOL (N/A) as a surgical intervention.  The patient's history has been reviewed, patient examined, no change in status, stable for surgery.  I have reviewed the patient's chart and labs.  Questions were answered to the patient's satisfaction.     Jenny Lai

## 2019-02-19 NOTE — Progress Notes (Signed)
PROGRESS NOTE    [name]  [id number] DOB: 11-05-1950 DOA: 02/17/2019 PCP: Lavone Orn, MD   Brief Narrative:  HPI: [name] is a 69 y.o. female with medical history significant of s/p AVR not on AC;  and HTN presenting with rectal bleeding.  She reports that a year and a half ago she had a very bad GI bleed; they thought it was a duodenal ulcer they saw on EGD.  This happened in [place], [place]; she was admitted and transfused 2 unit PRBC.  She was not scoped while in the hospital.  No problems since then.  She started right before Thanksgiving with ?blood in stools.  She would have intermittent black stools.  Today, she had 5 episodes of BRPPR.  There was clearly blood in the commode.  She denies pain, a "little bit of a gurgle."  This is similar to her episode a year and a half ago.  She did not have a c-scope because she had had a prior c-scope with polyps.  She is not light-headed or dizzy.  Her last episode of bleeding was in the ER with clots.  ED Course:  H/o bleeding duondenal ulcer requiring transfusion in 2019, presenting with BRBPR today.  Hgb stable but multiple episodes of gross blood today.  No NSAIDs.  Dr. Alessandra Bevels recommends admission, clear liquids.  Assessment & Plan:   Principal Problem:   Acute GI bleeding Active Problems:   History of aortic valve replacement with bioprosthetic valve   Essential hypertension   Rectal bleeding   Rectal bleeding/lower GI bleeding/acute blood loss anemia/colonic polyp/cecal AVM: She had further rectal bleeding started yesterday afternoon that lasted until 3 AM.  She has no complaints at this point in time.  Hemoglobin dropped to 7.4.  We will recheck after scopes later today and transfuse if drops less than 7.  Just underwent EGD and colonoscopy.  Does not have any esophagitis.  Duodenal biopsy taken.  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  Ascending colon polyp removed.  Appreciate GI  help.  Essential hypertension: Blood pressure slightly on the low side.  Will discontinue all antihypertensives except Toprol.    H/o AVR -no longer on AC or ASA  DVT prophylaxis: SCD Code Status: Full code Family Communication:  None present at bedside.  Plan of care discussed with patient in length and he verbalized understanding and agreed with it. Disposition Plan: We will need overnight observation.  May potentially discharge home tomorrow if no further GI bleeding and GI clears.  Estimated body mass index is 27.22 kg/m as calculated from the following:   Height as of this encounter: 5' 3.5" (1.613 m).   Weight as of this encounter: 70.8 kg.      Nutritional status:               Consultants:   GI  Procedures:   None  Antimicrobials:   None   Subjective: Seen and examined.  Last bloody bowel movement was 3 AM.  No complaint.  Objective: Vitals:   02/19/19 0930 02/19/19 1126 02/19/19 1138 02/19/19 1145  BP: (!) 110/58 (!) 91/44 (!) 106/53 (!) 111/50  Pulse: 71 73 72 71  Resp: 18 13 16  (!) 23  Temp:  97.6 F (36.4 C)    TempSrc:  Oral    SpO2: 98% 96% 100% 95%  Weight:      Height:        Intake/Output  Summary (Last 24 hours) at 02/19/2019 1152 Last data filed at 02/19/2019 1115 Gross per 24 hour  Intake 1737.67 ml  Output 0 ml  Net 1737.67 ml   Filed Weights   02/17/19 1011 02/17/19 1300 02/17/19 2040  Weight: 70.7 kg 70.8 kg 70.8 kg    Examination:  General exam: Appears calm and comfortable  Respiratory system: Clear to auscultation. Respiratory effort normal. Cardiovascular system: S1 & S2 heard, RRR. No JVD, murmurs, rubs, gallops or clicks. No pedal edema. Gastrointestinal system: Abdomen is nondistended, soft and nontender. No organomegaly or masses felt. Normal bowel sounds heard. Central nervous system: Alert and oriented. No focal neurological deficits. Extremities: Symmetric 5 x 5 power. Skin: No rashes, lesions or ulcers.   Psychiatry: Judgement and insight appear normal. Mood & affect appropriate.     Data Reviewed: I have personally reviewed following labs and imaging studies  CBC: Recent Labs  Lab 02/17/19 0423 02/17/19 1634 02/18/19 0246 02/18/19 1802 02/19/19 0601  WBC 5.9 7.2 6.8 7.7 4.6  HGB 11.2* 10.3* 8.2* 9.0* 7.4*  HCT 35.0* 31.4* 25.1* 27.7* 22.6*  MCV 97.0 93.7 94.0 94.2 94.2  PLT 336 279 232 275 [id number]   Basic Metabolic Panel: Recent Labs  Lab 02/17/19 0423 02/18/19 0246 02/19/19 0601  NA 138 142 139  K 3.3* 4.4 3.8  CL 102 107 105  CO2 24 26 27   GLUCOSE 137* 119* 108*  BUN 21 17 11   CREATININE 0.84 0.79 0.78  CALCIUM 9.2 8.7* 8.2*   GFR: Estimated Creatinine Clearance: 63.4 mL/min (by C-G formula based on SCr of 0.78 mg/dL). Liver Function Tests: Recent Labs  Lab 02/17/19 0423  AST 20  ALT 17  ALKPHOS 94  BILITOT 0.8  PROT 6.1*  ALBUMIN 3.5   No results for input(s): LIPASE, AMYLASE in the last 168 hours. No results for input(s): AMMONIA in the last 168 hours. Coagulation Profile: No results for input(s): INR, PROTIME in the last 168 hours. Cardiac Enzymes: No results for input(s): CKTOTAL, CKMB, CKMBINDEX, TROPONINI in the last 168 hours. BNP (last 3 results) No results for input(s): PROBNP in the last 8760 hours. HbA1C: No results for input(s): HGBA1C in the last 72 hours. CBG: No results for input(s): GLUCAP in the last 168 hours. Lipid Profile: No results for input(s): CHOL, HDL, LDLCALC, TRIG, CHOLHDL, LDLDIRECT in the last 72 hours. Thyroid Function Tests: No results for input(s): TSH, T4TOTAL, FREET4, T3FREE, THYROIDAB in the last 72 hours. Anemia Panel: No results for input(s): VITAMINB12, FOLATE, FERRITIN, TIBC, IRON, RETICCTPCT in the last 72 hours. Sepsis Labs: No results for input(s): PROCALCITON, LATICACIDVEN in the last 168 hours.  Recent Results (from the past 240 hour(s))  SARS CORONAVIRUS 2 (TAT 6-24 HRS) Nasopharyngeal Nasopharyngeal  Swab     Status: None   Collection Time: 02/17/19 10:10 AM   Specimen: Nasopharyngeal Swab  Result Value Ref Range Status   SARS Coronavirus 2 NEGATIVE NEGATIVE Final    Comment: (NOTE) SARS-CoV-2 target nucleic acids are NOT DETECTED. The SARS-CoV-2 RNA is generally detectable in upper and lower respiratory specimens during the acute phase of infection. Negative results do not preclude SARS-CoV-2 infection, do not rule out co-infections with other pathogens, and should not be used as the sole basis for treatment or other patient management decisions. Negative results must be combined with clinical observations, patient history, and epidemiological information. The expected result is Negative. Fact Sheet for Patients: SugarRoll.be Fact Sheet for Healthcare Providers: https://www.woods-mathews.com/ This test is not yet approved  or cleared by the [place] and  has been authorized for detection and/or diagnosis of SARS-CoV-2 by FDA under an Emergency Use Authorization (EUA). This EUA will remain  in effect (meaning this test can be used) for the duration of the COVID-19 declaration under Section 56 4(b)(1) of the Act, 21 U.S.C. section 360bbb-3(b)(1), unless the authorization is terminated or revoked sooner. Performed at [facility], [place] [address], [place], [place] [address]       Radiology Studies: No results found.  Scheduled Meds: . [MAR Hold] amLODipine  5 mg Oral Daily  . [MAR Hold] losartan  100 mg Oral Daily   And  . [MAR Hold] hydrochlorothiazide  25 mg Oral Daily  . [MAR Hold] metoprolol succinate  25 mg Oral Daily  . [MAR Hold] pantoprazole (PROTONIX) IV  40 mg Intravenous Daily  . [MAR Hold] sodium chloride flush  3 mL Intravenous Q12H   Continuous Infusions: . lactated ringers 75 mL/hr at 02/18/19 1406  . lactated ringers Stopped (02/19/19 1129)     LOS: 1 day   Time spent: 27 minutes  Darliss Cheney,  MD [facility]  02/19/2019, 11:52 AM   To contact the attending provider between 7A-7P or the covering provider during after hours 7P-7A, please log into the web site [email or web address].

## 2019-02-19 NOTE — Anesthesia Postprocedure Evaluation (Signed)
Anesthesia Post Note  Patient: [name]  Procedure(s) Performed: COLONOSCOPY WITH PROPOFOL (N/A ) ESOPHAGOGASTRODUODENOSCOPY (EGD) WITH PROPOFOL (N/A ) BIOPSY HOT HEMOSTASIS (ARGON PLASMA COAGULATION/BICAP) (N/A ) HEMOSTASIS CLIP PLACEMENT POLYPECTOMY     Patient location during evaluation: Endoscopy Anesthesia Type: MAC Level of consciousness: awake and alert Pain management: pain level controlled Vital Signs Assessment: post-procedure vital signs reviewed and stable Respiratory status: spontaneous breathing, nonlabored ventilation, respiratory function stable and patient connected to nasal cannula oxygen Cardiovascular status: stable and blood pressure returned to baseline Postop Assessment: no apparent nausea or vomiting Anesthetic complications: no    Last Vitals:  Vitals:   02/19/19 1703 02/19/19 2018  BP: (!) 122/40 (!) 114/58  Pulse: 73 75  Resp: 18 18  Temp: 36.7 C 36.9 C  SpO2: 93% 94%    Last Pain:  Vitals:   02/19/19 2018  TempSrc: Oral  PainSc:                  [name] [name]

## 2019-02-19 NOTE — Brief Op Note (Signed)
02/17/2019 - 02/19/2019  11:37 AM  PATIENT:  [name]  69 y.o. female  PRE-OPERATIVE DIAGNOSIS:  HEMATOCHEZIA  POST-OPERATIVE DIAGNOSIS:  EGD: no esophagitis, biopsy of duodneal biopsy colon: 2 AVMs at cecum-APC used and 3 clips placed. ascending colon polyp x1 removed, sigmoid colon polyp biopsy, sigmoid colon polypectomyx1, rectal polypectomyx2  PROCEDURE:  Procedure(s): COLONOSCOPY WITH PROPOFOL (N/A) ESOPHAGOGASTRODUODENOSCOPY (EGD) WITH PROPOFOL (N/A) BIOPSY HOT HEMOSTASIS (ARGON PLASMA COAGULATION/BICAP) (N/A) HEMOSTASIS CLIP PLACEMENT POLYPECTOMY  SURGEON:  Surgeon(s) and Role:    * Romero Letizia, MD - Primary  Findings ------------ -EGD showed nonobstructive lower esophageal ring.  2 submucosal lesions in the duodenum.  Mostly benign lipomas -Colonoscopy showed multiple AVMs in the cecum.  Treated with APC and clips placement.  Few polyps removed.  Diverticulosis and hemorrhoids seen.  Recommendations ------------------------- -Advance diet to soft -Monitor H&H -Follow path results -GI will follow tomorrow  Otis Brace MD, FACP 02/19/2019, 11:38 AM  Contact #  [phone]

## 2019-02-19 NOTE — Anesthesia Preprocedure Evaluation (Addendum)
Anesthesia Evaluation  Patient identified by MRN, date of birth, ID band Patient awake    Reviewed: Allergy & Precautions, NPO status , Patient's Chart, lab work & pertinent test results  Airway Mallampati: II  TM Distance: >3 FB Neck ROM: Full    Dental no notable dental hx.    Pulmonary former smoker,    Pulmonary exam normal breath sounds clear to auscultation       Cardiovascular hypertension, Pt. on medications and Pt. on home beta blockers Normal cardiovascular exam Rhythm:Regular Rate:Normal  ECG: SB, rate 58  ECHO: 1. The left ventricle has normal systolic function with an ejection fraction of 60-65%. The cavity size was normal. Left ventricular diastolic Doppler parameters are consistent with impaired relaxation. 2. The right ventricle has normal systolic function. The cavity was normal. There is no increase in right ventricular wall thickness. Right ventricular systolic pressure is normal with an estimated pressure of 32.4 mmHg. 3. Right atrial size was mildly dilated. 4. Severely thickening of the aortic valve. Severe calcifcation of the aortic valve. Aortic valve regurgitation is moderate to severe by color flow Doppler. 5. S/P 52m pericardial tissue valve. The valve leaflets are thickened and calcified with reduced motion. The mean AVG is 369mg. 6. The aorta is normal unless otherwise noted. 7. Compared to prior echo the mean AVG has increased from 2741m to 82m87m  S/p AVR   Neuro/Psych  Neuromuscular disease negative psych ROS   GI/Hepatic Neg liver ROS, PUD, Bowel prep,  Endo/Other  negative endocrine ROS  Renal/GU negative Renal ROS     Musculoskeletal Raynaud's disease   Abdominal   Peds  Hematology  (+) anemia ,   Anesthesia Other Findings HEMATOCHEZIA  Reproductive/Obstetrics                           Anesthesia Physical Anesthesia Plan  ASA: III  Anesthesia  Plan: MAC   Post-op Pain Management:    Induction: Intravenous  PONV Risk Score and Plan: 2 and Propofol infusion and Treatment may vary due to age or medical condition  Airway Management Planned: Nasal Cannula  Additional Equipment:   Intra-op Plan:   Post-operative Plan: Extubation in OR  Informed Consent: I have reviewed the patients History and Physical, chart, labs and discussed the procedure including the risks, benefits and alternatives for the proposed anesthesia with the patient or authorized representative who has indicated his/her understanding and acceptance.     Dental advisory given  Plan Discussed with: CRNA  Anesthesia Plan Comments:        Anesthesia Quick Evaluation

## 2019-02-19 NOTE — Op Note (Signed)
[facility] Patient Name: [name] Procedure Date : 02/19/2019 MRN: [id number] Attending MD: Otis Brace , MD Date of Birth: 1951/01/17 CSN: [id number] Age: 69 Admit Type: Inpatient Procedure:                Colonoscopy Indications:              Last colonoscopy: 2018, Hematochezia Providers:                Otis Brace, MD, Lina Sar, Technician,                            Corie Chiquito, Technician, Glori Bickers, RN, Lillie Fragmin CRNA Referring MD:              Medicines:                Sedation Administered by an Anesthesia Professional Complications:            No immediate complications. Estimated Blood Loss:     Estimated blood loss was minimal. Procedure:                Pre-Anesthesia Assessment:                           - Prior to the procedure, a History and Physical                            was performed, and patient medications and                            allergies were reviewed. The patient's tolerance of                            previous anesthesia was also reviewed. The risks                            and benefits of the procedure and the sedation                            options and risks were discussed with the patient.                            All questions were answered, and informed consent                            was obtained. Prior Anticoagulants: The patient has                            taken no previous anticoagulant or antiplatelet                            agents. ASA Grade Assessment: III - A patient with  severe systemic disease. After reviewing the risks                            and benefits, the patient was deemed in                            satisfactory condition to undergo the procedure.                           - Prior to the procedure, a History and Physical                            was performed, and patient medications and      allergies were reviewed. The patient's tolerance of                            previous anesthesia was also reviewed. The risks                            and benefits of the procedure and the sedation                            options and risks were discussed with the patient.                            All questions were answered, and informed consent                            was obtained. Prior Anticoagulants: The patient has                            taken no previous anticoagulant or antiplatelet                            agents. ASA Grade Assessment: III - A patient with                            severe systemic disease. After reviewing the risks                            and benefits, the patient was deemed in                            satisfactory condition to undergo the procedure.                           After obtaining informed consent, the colonoscope                            was passed under direct vision. Throughout the                            procedure, the patient's blood pressure, pulse, and  oxygen saturations were monitored continuously. The                            [id number] Olympus pediatric colonscope                            was introduced through the anus and advanced to the                            the cecum, identified by appendiceal orifice and                            ileocecal valve. The colonoscopy was performed                            without difficulty. The patient tolerated the                            procedure well. The quality of the bowel                            preparation was adequate to identify polyps 6 mm                            and larger in size. Scope In: 10:37:25 AM Scope Out: 11:18:27 AM Scope Withdrawal Time: 0 hours 32 minutes 8 seconds  Total Procedure Duration: 0 hours 41 minutes 2 seconds  Findings:      Hemorrhoids were found on perianal exam.      Four small to  large patchy angioectasias with stigmata of recent       bleeding were found in the cecum. Fulguration to ablate the lesion to       prevent bleeding by argon plasma was unsuccessful on one medium size AVM       but was successfull for small AVM. For hemostasis, three hemostatic       clips were successfully placed. There was no bleeding at the end of the       maneuver.      A 8 mm polyp was found in the proximal ascending colon. The polyp was       sessile. The polyp was removed with a cold snare. Resection and       retrieval were complete.      Multiple diverticula were found in the sigmoid colon.      Two sessile polyps were found in the sigmoid colon. The polyps were 5 to       7 mm in size. These polyps were removed with a cold snare. Resection and       retrieval were complete.      Two polyps were found in the sigmoid colon. The polyps were small in       size. These polyps were removed with a cold biopsy forceps. Resection       and retrieval were complete.      Multiple sessile polyps were found in the rectum. The polyps were 5 to 8       mm in size. Three polyps were removed with a cold snare. Resection and  retrieval were complete.      Internal hemorrhoids were found during retroflexion. The hemorrhoids       were medium-sized. Impression:               - Hemorrhoids found on perianal exam.                           - Four recently bleeding colonic angioectasias.                            Treatment not successful. Treated with argon plasma                            coagulation (APC). Clips were placed.                           - One 8 mm polyp in the proximal ascending colon,                            removed with a cold snare. Resected and retrieved.                           - Diverticulosis in the sigmoid colon.                           - Two 5 to 7 mm polyps in the sigmoid colon,                            removed with a cold snare. Resected and retrieved.                            - Two small polyps in the sigmoid colon, removed                            with a cold biopsy forceps. Resected and retrieved.                           - Multiple 5 to 8 mm polyps in the rectum, removed                            with a cold snare. Resected and retrieved.                           - Internal hemorrhoids. Recommendation:           - Return patient to hospital ward for ongoing care.                           - Soft diet.                           - Continue present medications.                           - Await pathology results. Procedure Code(s):        ---  Professional ---                           [phone], 59, Colonoscopy, flexible; with control of                            bleeding, any method                           45385, Colonoscopy, flexible; with removal of                            tumor(s), polyp(s), or other lesion(s) by snare                            technique                           45380, 68, Colonoscopy, flexible; with biopsy,                            single or multiple Diagnosis Code(s):        --- Professional ---                           K64.8, Other hemorrhoids                           K55.21, Angiodysplasia of colon with hemorrhage                           K63.5, Polyp of colon                           K62.1, Rectal polyp                           K92.1, Melena (includes Hematochezia)                           K57.30, Diverticulosis of large intestine without                            perforation or abscess without bleeding CPT copyright 2019 American Medical Association. All rights reserved. The codes documented in this report are preliminary and upon coder review may  be revised to meet current compliance requirements. Otis Brace, MD Otis Brace, MD 02/19/2019 11:37:16 AM Number of Addenda: 0

## 2019-02-19 NOTE — Plan of Care (Signed)
°  Problem: Coping: °Goal: Level of anxiety will decrease °Outcome: Progressing °  °

## 2019-02-19 NOTE — Transfer of Care (Signed)
Immediate Anesthesia Transfer of Care Note  Patient: [name]  Procedure(s) Performed: COLONOSCOPY WITH PROPOFOL (N/A ) ESOPHAGOGASTRODUODENOSCOPY (EGD) WITH PROPOFOL (N/A ) BIOPSY HOT HEMOSTASIS (ARGON PLASMA COAGULATION/BICAP) (N/A ) HEMOSTASIS CLIP PLACEMENT POLYPECTOMY  Patient Location: PACU and Short Stay  Anesthesia Type:MAC  Level of Consciousness: awake, alert  and oriented  Airway & Oxygen Therapy: Patient Spontanous Breathing  Post-op Assessment: Report given to RN and Post -op Vital signs reviewed and stable  Post vital signs: Reviewed and stable  Last Vitals:  Vitals Value Taken Time  BP 91/44 02/19/19 1126  Temp    Pulse 74 02/19/19 1127  Resp 17 02/19/19 1127  SpO2 99 % 02/19/19 1127  Vitals shown include unvalidated device data.  Last Pain:  Vitals:   02/19/19 1126  TempSrc:   PainSc: 0-No pain         Complications: No apparent anesthesia complications

## 2019-02-19 NOTE — Op Note (Signed)
[facility] Patient Name: [name] Procedure Date : 02/19/2019 MRN: [id number] Attending MD: Otis Brace , MD Date of Birth: 1950-09-01 CSN: [id number] Age: 69 Admit Type: Inpatient Procedure:                Upper GI endoscopy Indications:              Follow-up of esophagitis Providers:                Otis Brace, MD, Lina Sar, Technician,                            Corie Chiquito, Technician, Glori Bickers, RN, Lillie Fragmin CRNA Referring MD:              Medicines:                Sedation Administered by an Anesthesia Professional Complications:            No immediate complications. Estimated Blood Loss:     Estimated blood loss was minimal. Procedure:                Pre-Anesthesia Assessment:                           - Prior to the procedure, a History and Physical                            was performed, and patient medications and                            allergies were reviewed. The patient's tolerance of                            previous anesthesia was also reviewed. The risks                            and benefits of the procedure and the sedation                            options and risks were discussed with the patient.                            All questions were answered, and informed consent                            was obtained. Prior Anticoagulants: The patient has                            taken no previous anticoagulant or antiplatelet                            agents. ASA Grade Assessment: III - A patient with  severe systemic disease. After reviewing the risks                            and benefits, the patient was deemed in                            satisfactory condition to undergo the procedure.                           After obtaining informed consent, the endoscope was                            passed under direct vision. Throughout the    procedure, the patient's blood pressure, pulse, and                            oxygen saturations were monitored continuously. The                            [id number] Olympus gastroscope was                            introduced through the mouth, and advanced to the                            second part of duodenum. The upper GI endoscopy was                            accomplished without difficulty. The patient                            tolerated the procedure well. Scope In: Scope Out: Findings:      A widely patent Schatzki ring was found at the gastroesophageal junction       at 35 cm.      A small hiatal hernia was present.      The exam of the esophagus was otherwise normal.      Normal mucosa was found in the entire examined stomach.      The cardia and gastric fundus were normal on retroflexion.      Two 5 to 10 mm submucosal nodules with a localized distribution were       found in the first portion of the duodenum. Biopsies were taken with a       cold forceps for histology. Impression:               - Widely patent Schatzki ring.                           - Small hiatal hernia.                           - Normal mucosa was found in the entire stomach.                           - Submucosal nodule found in the duodenum. Biopsied. Recommendation:           -  Await pathology results.                           - Perform a colonoscopy today. Procedure Code(s):        --- Professional ---                           [phone], Esophagogastroduodenoscopy, flexible,                            transoral; with biopsy, single or multiple Diagnosis Code(s):        --- Professional ---                           K22.2, Esophageal obstruction                           K44.9, Diaphragmatic hernia without obstruction or                            gangrene                           K31.89, Other diseases of stomach and duodenum                           K20.90, Esophagitis,  unspecified without bleeding CPT copyright 2019 American Medical Association. All rights reserved. The codes documented in this report are preliminary and upon coder review may  be revised to meet current compliance requirements. Otis Brace, MD Otis Brace, MD 02/19/2019 11:27:31 AM Number of Addenda: 0

## 2019-02-20 LAB — BASIC METABOLIC PANEL
Anion gap: 9 (ref 5–15)
BUN: 8 mg/dL (ref 8–23)
CO2: 24 mmol/L (ref 22–32)
Calcium: 8.1 mg/dL — ABNORMAL LOW (ref 8.9–10.3)
Chloride: 108 mmol/L (ref 98–111)
Creatinine, Ser: 0.85 mg/dL (ref 0.44–1.00)
GFR calc Af Amer: >60 mL/min (ref >60)
GFR calc non Af Amer: >60 mL/min (ref >60)
Glucose, Bld: 111 mg/dL — ABNORMAL HIGH (ref 70–99)
Potassium: 3.6 mmol/L (ref 3.5–5.1)
Sodium: 141 mmol/L (ref 135–145)

## 2019-02-20 LAB — CBC WITH DIFFERENTIAL/PLATELET
Abs Immature Granulocytes: 0.02 10*3/uL (ref 0.00–0.07)
Basophils Absolute: 0 10*3/uL (ref 0.0–0.1)
Basophils Relative: 1 %
Eosinophils Absolute: 0.1 10*3/uL (ref 0.0–0.5)
Eosinophils Relative: 2 %
HCT: 25.8 % — ABNORMAL LOW (ref 36.0–46.0)
Hemoglobin: 8.2 g/dL — ABNORMAL LOW (ref 12.0–15.0)
Immature Granulocytes: 0 %
Lymphocytes Relative: 14 %
Lymphs Abs: 0.9 10*3/uL (ref 0.7–4.0)
MCH: 30.6 pg (ref 26.0–34.0)
MCHC: 31.8 g/dL (ref 30.0–36.0)
MCV: 96.3 fL (ref 80.0–100.0)
Monocytes Absolute: 0.6 10*3/uL (ref 0.1–1.0)
Monocytes Relative: 10 %
Neutro Abs: 4.4 10*3/uL (ref 1.7–7.7)
Neutrophils Relative %: 73 %
Platelets: 237 10*3/uL (ref 150–400)
RBC: 2.68 MIL/uL — ABNORMAL LOW (ref 3.87–5.11)
RDW: 12.2 % (ref 11.5–15.5)
WBC: 6.1 10*3/uL (ref 4.0–10.5)
nRBC: 0 % (ref 0.0–0.2)

## 2019-02-20 MED ORDER — PANTOPRAZOLE SODIUM 40 MG PO TBEC
40.0000 mg | DELAYED_RELEASE_TABLET | Freq: Every day | ORAL | Status: DC
  Administered 2019-02-20: 40 mg via ORAL
  Filled 2019-02-20: qty 1

## 2019-02-20 NOTE — Plan of Care (Signed)
  Problem: Education: Goal: Ability to identify signs and symptoms of gastrointestinal bleeding will improve 02/20/2019 1516 by Dolores Hoose, RN Outcome: Adequate for Discharge 02/20/2019 1107 by Dolores Hoose, RN Outcome: Progressing   Problem: Bowel/Gastric: Goal: Will show no signs and symptoms of gastrointestinal bleeding Outcome: Adequate for Discharge   Problem: Fluid Volume: Goal: Will show no signs and symptoms of excessive bleeding Outcome: Adequate for Discharge   Problem: Clinical Measurements: Goal: Complications related to the disease process, condition or treatment will be avoided or minimized Outcome: Adequate for Discharge   Problem: Health Behavior/Discharge Planning: Goal: Ability to manage health-related needs will improve Outcome: Adequate for Discharge

## 2019-02-20 NOTE — Discharge Summary (Signed)
[name] [id number] DOB: August 23, 1950 DOA: 02/17/2019  PCP: Lavone Orn, MD  Admit date: 02/17/2019 Discharge date: 02/20/2019  Admitted From: Home Disposition: Home  Recommendations for Outpatient Follow-up:  1. Follow up with PCP in 1 week 2. Please obtain BMP/CBC in one week 3. Follow-up with GI Dr. Lacretia Leigh in 1 week  Home Health: None   Discharge Condition:Stable CODE STATUS: Full Diet recommendation: Heart Healthy  Brief/Interim Summary: [name] a 69 y.o.femalewith medical history significant ofs/p AVR not on AC; and HTN presenting with rectal bleeding.She reported that a year and a half ago she had a very bad GI bleed; they thought it was a duodenal ulcer they saw on EGD. This happened in [place], [place]; she was admitted and transfused 2 unit PRBC. She was not scoped while in the hospital. No problems since then.  GI consulted.EGD and colonoscopy.Does not have any esophagitis.  Duodenal biopsy taken.  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  Ascending colon polyp removed.  Due to her lower GI bleed and soft blood pressure her antihypertensives were discontinued except for Toprol.  She will need to follow-up with her primary care about her blood pressure.  GI cleared her for discharge today.  Bleed today.  H&H is stable.  GI recommended to f/u with Dr. Madaline Savage in 1 to 2 weeks and stay off of any blood thinners or aspirin and NSAIDs until follow-up with them.  Discharge Diagnoses:  Principal Problem:   Acute GI bleeding Active Problems:   History of aortic valve replacement with bioprosthetic valve   Essential hypertension   Rectal bleeding    Discharge Instructions  Discharge Instructions    Call MD for:  persistant dizziness or light-headedness   Complete by: As directed    Diet - low sodium heart healthy   Complete by: As directed    Discharge instructions   Complete by: As directed    Monitor blood  pressure. Hydrate Call Dr. Jane Canary next week to follow up in one week. No aspirin  Or NSAIDS until you follow  Up with GI   Increase activity slowly   Complete by: As directed      Allergies as of 02/20/2019      Reactions   Ace Inhibitors Cough      Sulfa Antibiotics Other (See Comments)   REACTION: "MASSIVE HEADACHE"   Amlodipine Other (See Comments)   ANKLE SWELLING at high dose      Medication List    STOP taking these medications   amLODipine 5 MG tablet Commonly known as: NORVASC   losartan-hydrochlorothiazide 100-25 MG tablet Commonly known as: HYZAAR     TAKE these medications   cephALEXin 500 MG capsule Commonly known as: KEFLEX Take 2,000 mg by mouth as needed (Prior to [place]).   cholecalciferol 1000 units tablet Commonly known as: VITAMIN D Take 1,000 Units by mouth daily.   fluticasone 50 MCG/ACT nasal spray Commonly known as: FLONASE Place 2 sprays into the nose daily. Only during allergy season   metoprolol succinate 25 MG 24 hr tablet Commonly known as: TOPROL-XL TAKE 1 TABLET BY MOUTH EVERY DAY   pantoprazole 40 MG tablet Commonly known as: PROTONIX Take 40 mg by mouth 2 (two) times daily.      Follow-up Information    Lavone Orn, MD Follow up in 1 week(s).   Specialty: Internal Medicine Why: cbc Contact information: [address]  [place] [address] [phone]        Otis Brace, MD Follow up in 1 week(s).   Specialty: Gastroenterology Contact information: [place] [id number] [phone]          Allergies  Allergen Reactions  . Ace Inhibitors Cough       . Sulfa Antibiotics Other (See Comments)    REACTION: "MASSIVE HEADACHE"  . Amlodipine Other (See Comments)    ANKLE SWELLING at high dose    Consultations:  GI   Procedures/Studies:  No results found. EGD and colonoscopy   Subjective: Patient seen and examined.  No  further bleed.  Denies any chest pain, shortness of breath or dizziness.  She feels she is ready to go home.  Discharge Exam: Vitals:   02/20/19 0602 02/20/19 1144  BP: (!) 113/52 124/63  Pulse: 70 76  Resp: 18   Temp: 98.3 F (36.8 C)   SpO2: 94%    Vitals:   02/19/19 1703 02/19/19 2018 02/20/19 0602 02/20/19 1144  BP: (!) 122/40 (!) 114/58 (!) 113/52 124/63  Pulse: 73 75 70 76  Resp: 18 18 18    Temp: 98 F (36.7 C) 98.5 F (36.9 C) 98.3 F (36.8 C)   TempSrc: Oral Oral Oral   SpO2: 93% 94% 94%   Weight:      Height:        General: Pt is alert, awake, not in acute distress Cardiovascular: RRR, S1/S2 +, no rubs, no gallops Respiratory: CTA bilaterally, no wheezing, no rhonchi Abdominal: Soft, NT, ND, bowel sounds + Extremities: no edema, no cyanosis    The results of significant diagnostics from this hospitalization (including imaging, microbiology, ancillary and laboratory) are listed below for reference.     Microbiology: Recent Results (from the past 240 hour(s))  SARS CORONAVIRUS 2 (TAT 6-24 HRS) Nasopharyngeal Nasopharyngeal Swab     Status: None   Collection Time: 02/17/19 10:10 AM   Specimen: Nasopharyngeal Swab  Result Value Ref Range Status   SARS Coronavirus 2 NEGATIVE NEGATIVE Final    Comment: (NOTE) SARS-CoV-2 target nucleic acids are NOT DETECTED. The SARS-CoV-2 RNA is generally detectable in upper and lower respiratory specimens during the acute phase of infection. Negative results do not preclude SARS-CoV-2 infection, do not rule out co-infections with other pathogens, and should not be used as the sole basis for treatment or other patient management decisions. Negative results must be combined with clinical observations, patient history, and epidemiological information. The expected result is Negative. Fact Sheet for Patients: SugarRoll.be Fact Sheet for Healthcare  Providers: https://www.woods-mathews.com/ This test is not yet approved or cleared by the [place] FDA and  has been authorized for detection and/or diagnosis of SARS-CoV-2 by FDA under an Emergency Use Authorization (EUA). This EUA will remain  in effect (meaning this test can be used) for the duration of the COVID-19 declaration under Section 56 4(b)(1) of the Act, 21 U.S.C. section 360bbb-3(b)(1), unless the authorization is terminated or revoked sooner. Performed at [facility], [place] [address], [place], [place] [address]      Labs: BNP (last 3 results) No results for input(s): BNP in the last 8760 hours. Basic Metabolic Panel: Recent Labs  Lab 02/17/19 0423 02/18/19 0246 02/19/19 0601 02/20/19 0407  NA 138 142 139 141  K 3.3* 4.4 3.8 3.6  CL 102 107 105 108  CO2 24 26 27 24   GLUCOSE 137* 119* 108* 111*  BUN 21 17 11  8  CREATININE 0.84 0.79 0.78 0.85  CALCIUM 9.2 8.7* 8.2* 8.1*   Liver Function Tests: Recent Labs  Lab 02/17/19 0423  AST 20  ALT 17  ALKPHOS 94  BILITOT 0.8  PROT 6.1*  ALBUMIN 3.5   No results for input(s): LIPASE, AMYLASE in the last 168 hours. No results for input(s): AMMONIA in the last 168 hours. CBC: Recent Labs  Lab 02/18/19 0246 02/18/19 1802 02/19/19 0601 02/19/19 1801 02/20/19 0954  WBC 6.8 7.7 4.6 6.1 6.1  NEUTROABS  --   --   --   --  4.4  HGB 8.2* 9.0* 7.4* 7.6* 8.2*  HCT 25.1* 27.7* 22.6* 24.0* 25.8*  MCV 94.0 94.2 94.2 97.2 96.3  PLT 232 275 202 216 237   Cardiac Enzymes: No results for input(s): CKTOTAL, CKMB, CKMBINDEX, TROPONINI in the last 168 hours. BNP: Invalid input(s): POCBNP CBG: No results for input(s): GLUCAP in the last 168 hours. D-Dimer No results for input(s): DDIMER in the last 72 hours. Hgb A1c No results for input(s): HGBA1C in the last 72 hours. Lipid Profile No results for input(s): CHOL, HDL, LDLCALC, TRIG, CHOLHDL, LDLDIRECT in the last 72 hours. Thyroid function  studies No results for input(s): TSH, T4TOTAL, T3FREE, THYROIDAB in the last 72 hours.  Invalid input(s): FREET3 Anemia work up No results for input(s): VITAMINB12, FOLATE, FERRITIN, TIBC, IRON, RETICCTPCT in the last 72 hours. Urinalysis    Component Value Date/Time   COLORURINE YELLOW 07/03/2009 1035   APPEARANCEUR CLEAR 07/03/2009 1035   LABSPEC 1.018 07/03/2009 1035   PHURINE 6.5 07/03/2009 1035   GLUCOSEU NEGATIVE 07/03/2009 1035   HGBUR NEGATIVE 07/03/2009 1035   BILIRUBINUR NEGATIVE 07/03/2009 1035   KETONESUR NEGATIVE 07/03/2009 1035   PROTEINUR NEGATIVE 07/03/2009 1035   UROBILINOGEN 1.0 07/03/2009 1035   NITRITE NEGATIVE 07/03/2009 1035   LEUKOCYTESUR  07/03/2009 1035    NEGATIVE MICROSCOPIC NOT DONE ON URINES WITH NEGATIVE PROTEIN, BLOOD, LEUKOCYTES, NITRITE, OR GLUCOSE <1000 mg/dL.   Sepsis Labs Invalid input(s): PROCALCITONIN,  WBC,  LACTICIDVEN Microbiology Recent Results (from the past 240 hour(s))  SARS CORONAVIRUS 2 (TAT 6-24 HRS) Nasopharyngeal Nasopharyngeal Swab     Status: None   Collection Time: 02/17/19 10:10 AM   Specimen: Nasopharyngeal Swab  Result Value Ref Range Status   SARS Coronavirus 2 NEGATIVE NEGATIVE Final    Comment: (NOTE) SARS-CoV-2 target nucleic acids are NOT DETECTED. The SARS-CoV-2 RNA is generally detectable in upper and lower respiratory specimens during the acute phase of infection. Negative results do not preclude SARS-CoV-2 infection, do not rule out co-infections with other pathogens, and should not be used as the sole basis for treatment or other patient management decisions. Negative results must be combined with clinical observations, patient history, and epidemiological information. The expected result is Negative. Fact Sheet for Patients: SugarRoll.be Fact Sheet for Healthcare Providers: https://www.woods-mathews.com/ This test is not yet approved or cleared by the [place]  FDA and  has been authorized for detection and/or diagnosis of SARS-CoV-2 by FDA under an Emergency Use Authorization (EUA). This EUA will remain  in effect (meaning this test can be used) for the duration of the COVID-19 declaration under Section 56 4(b)(1) of the Act, 21 U.S.C. section 360bbb-3(b)(1), unless the authorization is terminated or revoked sooner. Performed at [facility], [place] [address], [place],  [address]      Time coordinating discharge: Over 30 minutes  SIGNED:   Nolberto Hanlon, MD  [facility] 02/20/2019, 3:16 PM Pager  If 7PM-7AM, please contact night-coverage [email or web address] Password [email or web address]

## 2019-02-20 NOTE — Plan of Care (Signed)
  Problem: Education: Goal: Ability to identify signs and symptoms of gastrointestinal bleeding will improve Outcome: Progressing   Problem: Nutrition: Goal: Adequate nutrition will be maintained Outcome: Progressing

## 2019-02-20 NOTE — Progress Notes (Signed)
[name] 1:32 PM  Subjective: Patient seen and examined in her hospital computer chart reviewed including her recent procedures and her case discussed with my partner Dr. Alessandra Bevels and she has not had any further bleeding or any procedural complications and she is tolerating her diet and would like to go home  Objective: Vital signs stable afebrile no acute distress in good spirits abdomen is soft nontender hemoglobin stable BUN okay  Assessment: Seemingly resolved GI blood loss probably due to AVMs status post therapy  Plan: She will call Dr. Alessandra Bevels middle of next week for pathology results and to set up a follow-up in 1 to 2 weeks and stay off blood thinners and aspirin until follow-up and please call me if I can be of any further assistance with this hospital stay  [facility]  office [phone] After 5PM or if no answer call [phone]

## 2019-02-20 NOTE — Progress Notes (Signed)
DISCHARGE NOTE SNF [name] to be discharged Home per MD order. Patient verbalized understanding.  Skin clean, dry and intact without evidence of skin break down, no evidence of skin tears noted. IV catheter discontinued intact. Site without signs and symptoms of complications. Dressing and pressure applied. Pt denies pain at the site currently. No complaints noted.  Patient free of lines, drains, and wounds.   Discharge packet assembled. An After Visit Summary (AVS) was printed and given to the patient at bedside. Patient escorted via wheelchair and discharged to designated family friend via private transportation.  All questions and concerns addressed.   Dolores Hoose, RN

## 2019-02-21 ENCOUNTER — Ambulatory Visit: Payer: Medicare HMO | Attending: Internal Medicine

## 2019-02-21 DIAGNOSIS — Z23 Encounter for immunization: Secondary | ICD-10-CM

## 2019-02-21 NOTE — Progress Notes (Signed)
   Covid-19 Vaccination Clinic  Name:  [name]    MRN: [id number] DOB: 1950/12/08  02/21/2019  [name] was observed post Covid-19 immunization for 15 minutes without incidence. She was provided with Vaccine Information Sheet and instruction to access the V-Safe system.   [name] was instructed to call 911 with any severe reactions post vaccine: Marland Kitchen Difficulty breathing  . Swelling of your face and throat  . A fast heartbeat  . A bad rash all over your body  . Dizziness and weakness    Immunizations Administered    Name Date Dose VIS Date Route   Pfizer COVID-19 Vaccine 02/21/2019 11:08 AM 0.3 mL 01/08/2019 Intramuscular   Manufacturer: [employer]   Lot: [id number]   [place]: [id number]

## 2019-02-22 ENCOUNTER — Encounter: Payer: Self-pay | Admitting: *Deleted

## 2019-02-22 LAB — SURGICAL PATHOLOGY

## 2019-02-23 NOTE — Progress Notes (Signed)
Cardiology Office Note:    Date:  02/24/2019   ID:  LOISTEEN MUKHERJEE, DOB 07/27/1950, MRN [id number]  PCP:  Lavone Orn, MD  Cardiologist:  Sinclair Grooms, MD   Electrophysiologist:  None   Referring MD: Lavone Orn, MD   Chief Complaint  Patient presents with  . Hospitalization Follow-up    BP elevated; concerned about Aortic Valve; legs swelling     History of Present Illness:    [name] is a 69 y.o. female with:   Bicuspid aortic valve with severe aortic stenosis   S/p bioprosthetic AVR in 2011  Echo 08/2018: EF 60-65, Gr 1 DD, mod to severe AI, mean AoV gradient 32 mmHg  Hypertension   Raynaud's Dz  Melanoma   Hx of GI bleed  Probable duodenal ulcer; tx with 2 u PRBCs ([place], [place]) in 2019  Admx 01/2019 with rectal bleeding; cecal AVMs tx with APC  Ms. Longenecker was last seen in clinic by Sande Rives, PA-C in 10/2018.  Her last echocardiogram in 08/2018 demonstrated dysfunction of her AVR with leaflet thickening, mod to severe AI and increased mean gradient of 32 mmHg.  She was felt to be asymptomatic and a follow up echocardiogram is planned in 08/2019.  She was admitted 1/20-1/23 with rectal bleeding.  She was noted to have cecal AVMs on colonoscopy which were treated with APC.  Of note, due to hypotension, all of her blood pressure medications were held.  She contacted our office yesterday.  Her BP has been creeping up and she has developed swelling in her legs.  We brought her in today for further evaluation.  She does not some shortness of breath with certain activities since she was admitted.  She has not had chest pain, syncope.  She has been sleeping on an incline but not due to shortness of breath.  She has not had further bleeding and sees her PCP tomorrow for follow up.     Prior CV studies:   The following studies were reviewed today:  Echocardiogram 09/24/2018 EF 60-65, Gr 1 DD. Normal RVSF, RVSP 32.4, mild RAE, s/p AVR >> mod to severe AI,  severe calcification of AoV, mean gradient 32 mmHg  Cardiac catheterization 06/02/2009 Normal coronary arteries   Past Medical History:  Diagnosis Date  . Acute lower GI bleeding 02/17/2019  . Hyperplastic colon polyp 2009  . Hypertension   . Melanoma ([place]) 5-6 yrs ago  . Raynaud's disease   . S/P AVR   . Sciatic nerve disease, left    Surgical Hx: The patient  has a past surgical history that includes Cardiac valve replacement (7 yrs ago); Abdominal hysterectomy (yrs ago); Bilateral oophorectomy (2008); Eye surgery (Bilateral); Colonoscopy with propofol (N/A, 07/10/2016); Breast excisional biopsy (Right); Colonoscopy with propofol (N/A, 02/19/2019); Esophagogastroduodenoscopy (egd) with propofol (N/A, 02/19/2019); biopsy (02/19/2019); Hot hemostasis (N/A, 02/19/2019); Hemostasis clip placement (02/19/2019); and polypectomy (02/19/2019).   Current Medications: Current Meds  Medication Sig  . cephALEXin (KEFLEX) 500 MG capsule Take 2,000 mg by mouth as needed (Prior to [place]).   . cholecalciferol (VITAMIN D) 1000 UNITS tablet Take 1,000 Units by mouth daily.  . fluticasone (FLONASE) 50 MCG/ACT nasal spray Place 2 sprays into the nose daily. Only during allergy season  . pantoprazole (PROTONIX) 40 MG tablet Take 40 mg by mouth 2 (two) times daily.   . [DISCONTINUED] amLODipine (NORVASC) 5 MG tablet Take 5 mg by mouth daily.  . [DISCONTINUED] losartan-hydrochlorothiazide (HYZAAR) 100-25 MG  tablet Take 1 tablet by mouth daily.  . [DISCONTINUED] metoprolol succinate (TOPROL-XL) 25 MG 24 hr tablet TAKE 1 TABLET BY MOUTH EVERY DAY     Allergies:   Ace inhibitors, Sulfa antibiotics, and Amlodipine   Social History   Tobacco Use  . Smoking status: Former Smoker    Packs/day: 1.00    Years: 40.00    Pack years: 40.00    Types: Cigarettes    Quit date: 2005    Years since quitting: 16.0  . Smokeless tobacco: Never Used  Substance Use Topics  . Alcohol use: Yes     Alcohol/week: 0.0 standard drinks    Comment: 1 glass wine per day  . Drug use: No     Family Hx: The patient's family history includes Breast cancer in her mother; Cancer in her father; Healthy in her sister; Heart failure in her mother; Hypertension in her brother, father, sister, and sister; Osteoporosis in her sister.  ROS:   Please see the history of present illness.    ROS All other systems reviewed and are negative.   EKGs/Labs/Other Test Reviewed:    EKG:  EKG is  ordered today.  The ekg ordered today demonstrates sinus brady, HR 58, normal axis, low voltage, non-specific ST-TW changes, QTc 459, similar to prior tracings   Recent Labs: 02/17/2019: ALT 17 02/20/2019: BUN 8; Creatinine, Ser 0.85; Hemoglobin 8.2; Platelets 237; Potassium 3.6; Sodium 141   Recent Lipid Panel No results found for: CHOL, TRIG, HDL, CHOLHDL, LDLCALC, LDLDIRECT  Physical Exam:    VS:  BP 140/80   Pulse 61   Ht 5' 3.5" (1.613 m)   Wt 161 lb 12.8 oz (73.4 kg)   SpO2 100%   BMI 28.21 kg/m     Wt Readings from Last 3 Encounters:  02/24/19 161 lb 12.8 oz (73.4 kg)  02/17/19 156 lb 1.4 oz (70.8 kg)  11/02/18 155 lb 12.8 oz (70.7 kg)     Physical Exam  Constitutional: She is oriented to person, place, and time. She appears well-developed and well-nourished. No distress.  HENT:  Head: Normocephalic and atraumatic.  Neck: No JVD present.  Cardiovascular: Normal rate, regular rhythm, S1 normal and S2 normal.  Murmur heard.  Crescendo-decrescendo systolic murmur is present with a grade of 3/6 at the upper right sternal border. Pulmonary/Chest: Effort normal. She has no rales.  Abdominal: Soft. There is no hepatomegaly.  Musculoskeletal:        General: Edema (1+ bilat LE edema) present.     Cervical back: Neck supple.  Neurological: She is alert and oriented to person, place, and time.  Skin: Skin is warm and dry.    ASSESSMENT & PLAN:    1. History of aortic valve replacement with  bioprosthetic valve 2. Nonrheumatic aortic valve stenosis 3. Shortness of breath  4. Leg swelling  S/p bioprosthetic AVR in 2011.  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  She was not symptomatic at that time and she was to have a follow up echocardiogram in 1 year.  However, she was recently admitted with a LGI bleed.  She did not require PRBCs but her Hgb did drop into the 7 range.  She has now noticed shortness of breath with some activities and leg swelling.  I suspect this is all related to her blood loss anemia and being off of her thiazide diuretic.  As her AoV gradient has recently been noted to increase,  I have suggested we get a follow up limited echocardiogram to recheck her EF and AoV gradient to make sure there has not been significant progression since 08/2018.  She is concerned about her valve worsening and is agreeable to this.    -Obtain Limited 2D echocardiogram to recheck EF and reassess AVR, aortic stenosis   5. Essential hypertension BP is creeping up and she has noted increased leg swelling.  I have suggested we resume her Losartan/HCTZ at half dose for now.  As her BP increases, we can increase her Losartan/HCTZ back to her usual dose, then add back Amlodipine and Metoprolol if her BP increases.    -Start Losartan/HCTZ 50/12.5 mg once daily   6. Lower GI bleed FU with PCP tomorrow as planned. She has not noticed any further bleeding.     Dispo:  Return in about 6 weeks (around 04/07/2019) for Close Follow Up w/ Dr. Tamala Julian, or PA/NP.   Medication Adjustments/Labs and Tests Ordered: Current medicines are reviewed at length with the patient today.  Concerns regarding medicines are outlined above.  Tests Ordered: Orders Placed This Encounter  Procedures  . EKG 12-Lead  . ECHOCARDIOGRAM COMPLETE   Medication Changes: Meds ordered this encounter  Medications  . losartan-hydrochlorothiazide (HYZAAR) 50-12.5 MG tablet      Sig: Take 1 tablet by mouth daily.    Dispense:  30 tablet    Refill:  6    Signed, Richardson Dopp, PA-C  02/24/2019 12:36 PM    [facility] [address], [place], [place]  [address] Phone: [phone]; Fax: [phone]

## 2019-02-24 ENCOUNTER — Ambulatory Visit: Payer: Medicare HMO | Admitting: Physician Assistant

## 2019-02-24 ENCOUNTER — Encounter: Payer: Self-pay | Admitting: Physician Assistant

## 2019-02-24 ENCOUNTER — Other Ambulatory Visit: Payer: Self-pay

## 2019-02-24 VITALS — BP 140/80 | HR 61 | Ht 63.5 in | Wt 161.8 lb

## 2019-02-24 DIAGNOSIS — R0602 Shortness of breath: Secondary | ICD-10-CM

## 2019-02-24 DIAGNOSIS — I350 Nonrheumatic aortic (valve) stenosis: Secondary | ICD-10-CM | POA: Diagnosis not present

## 2019-02-24 DIAGNOSIS — Z953 Presence of xenogenic heart valve: Secondary | ICD-10-CM

## 2019-02-24 DIAGNOSIS — M7989 Other specified soft tissue disorders: Secondary | ICD-10-CM

## 2019-02-24 DIAGNOSIS — K922 Gastrointestinal hemorrhage, unspecified: Secondary | ICD-10-CM | POA: Diagnosis not present

## 2019-02-24 DIAGNOSIS — I10 Essential (primary) hypertension: Secondary | ICD-10-CM

## 2019-02-24 MED ORDER — LOSARTAN POTASSIUM-HCTZ 50-12.5 MG PO TABS: 1.0000 | ORAL_TABLET | Freq: Every day | ORAL | 6 refills | Status: DC

## 2019-02-24 NOTE — Patient Instructions (Addendum)
Medication Instructions:   Your physician has recommended you make the following change in your medication:   1) Start Losartan-HCTZ 50/12.5 mg, 1 tablet by mouth once a day  *If you need a refill on your cardiac medications before your next appointment, please call your pharmacy*  Lab Work:  None ordered today  If you have labs (blood work) drawn today and your tests are completely normal, you will receive your results only by: Marland Kitchen MyChart Message (if you have MyChart) OR . A paper copy in the mail If you have any lab test that is abnormal or we need to change your treatment, we will call you to review the results.  Testing/Procedures:  Your physician has requested that you have an echocardiogram in February 2021. Echocardiography is a painless test that uses sound waves to create images of your heart. It provides your doctor with information about the size and shape of your heart and how well your heart's chambers and valves are working. This procedure takes approximately one hour. There are no restrictions for this procedure.  Follow-Up: At [facility], you and your health needs are our priority.  As part of our continuing mission to provide you with exceptional heart care, we have created designated Provider Care Teams.  These Care Teams include your primary Cardiologist (physician) and Advanced Practice Providers (APPs -  Physician Assistants and Nurse Practitioners) who all work together to provide you with the care you need, when you need it.  Your next appointment:   6 week(s)  The format for your next appointment:   In Person  Provider:   You may see Sinclair Grooms, MD or one of the following Advanced Practice Providers on your designated Care Team:    Truitt Merle, NP  Cecilie Kicks, NP  Kathyrn Drown, NP  Make follow up appointment after echocardiogram.

## 2019-02-25 DIAGNOSIS — I10 Essential (primary) hypertension: Secondary | ICD-10-CM | POA: Diagnosis not present

## 2019-02-25 DIAGNOSIS — D500 Iron deficiency anemia secondary to blood loss (chronic): Secondary | ICD-10-CM | POA: Diagnosis not present

## 2019-02-25 DIAGNOSIS — K922 Gastrointestinal hemorrhage, unspecified: Secondary | ICD-10-CM | POA: Diagnosis not present

## 2019-02-25 DIAGNOSIS — R609 Edema, unspecified: Secondary | ICD-10-CM | POA: Diagnosis not present

## 2019-03-01 DIAGNOSIS — Z961 Presence of intraocular lens: Secondary | ICD-10-CM | POA: Diagnosis not present

## 2019-03-01 DIAGNOSIS — H26492 Other secondary cataract, left eye: Secondary | ICD-10-CM | POA: Diagnosis not present

## 2019-03-05 DIAGNOSIS — D500 Iron deficiency anemia secondary to blood loss (chronic): Secondary | ICD-10-CM | POA: Diagnosis not present

## 2019-03-05 DIAGNOSIS — Z86010 Personal history of colonic polyps: Secondary | ICD-10-CM | POA: Diagnosis not present

## 2019-03-05 DIAGNOSIS — K5521 Angiodysplasia of colon with hemorrhage: Secondary | ICD-10-CM | POA: Diagnosis not present

## 2019-03-09 ENCOUNTER — Other Ambulatory Visit (HOSPITAL_COMMUNITY): Payer: Medicare HMO

## 2019-03-12 ENCOUNTER — Other Ambulatory Visit: Payer: Self-pay

## 2019-03-12 ENCOUNTER — Ambulatory Visit
Admission: RE | Admit: 2019-03-12 | Discharge: 2019-03-12 | Disposition: A | Discharge status: Home / Self Care | Payer: Medicare HMO | Source: Ambulatory Visit | Attending: Internal Medicine | Admitting: Internal Medicine

## 2019-03-12 DIAGNOSIS — Z1231 Encounter for screening mammogram for malignant neoplasm of breast: Secondary | ICD-10-CM | POA: Diagnosis not present

## 2019-03-15 ENCOUNTER — Ambulatory Visit: Payer: Medicare HMO | Attending: Internal Medicine

## 2019-03-15 DIAGNOSIS — Z23 Encounter for immunization: Secondary | ICD-10-CM | POA: Insufficient documentation

## 2019-03-15 NOTE — Progress Notes (Signed)
   Covid-19 Vaccination Clinic  Name:  [name]    MRN: [id number] DOB: Jun 27, 1950  03/15/2019  Ms. Breech was observed post Covid-19 immunization for 15 minutes without incidence. She was provided with Vaccine Information Sheet and instruction to access the V-Safe system.   [name] was instructed to call 911 with any severe reactions post vaccine: Marland Kitchen Difficulty breathing  . Swelling of your face and throat  . A fast heartbeat  . A bad rash all over your body  . Dizziness and weakness    Immunizations Administered    Name Date Dose VIS Date Route   Pfizer COVID-19 Vaccine 03/15/2019 10:08 AM 0.3 mL 01/08/2019 Intramuscular   Manufacturer: [place]   Lot: [id number]   [place]: [id number]

## 2019-03-25 ENCOUNTER — Other Ambulatory Visit: Payer: Self-pay | Admitting: Physician Assistant

## 2019-03-25 ENCOUNTER — Ambulatory Visit (HOSPITAL_COMMUNITY): Payer: Medicare HMO | Attending: Cardiovascular Disease

## 2019-03-25 ENCOUNTER — Other Ambulatory Visit: Payer: Self-pay

## 2019-03-25 DIAGNOSIS — I350 Nonrheumatic aortic (valve) stenosis: Secondary | ICD-10-CM | POA: Diagnosis not present

## 2019-03-25 DIAGNOSIS — Z953 Presence of xenogenic heart valve: Secondary | ICD-10-CM | POA: Diagnosis not present

## 2019-03-26 ENCOUNTER — Encounter: Payer: Self-pay | Admitting: Physician Assistant

## 2019-03-28 NOTE — Progress Notes (Signed)
Cardiology Office Note:    Date:  04/07/2019   ID:  [name], DOB [birth year], MRN [id number]  PCP:  Lavone Orn, MD  Cardiologist:  Sinclair Grooms, MD   Referring MD: Lavone Orn, MD   Chief Complaint  Patient presents with  . Cardiac Valve Problem    Bioprosthetic valve    History of Present Illness:    [name] is a 69 y.o. female with a hx of aortic valve replacement2011( tissue), increased transvalvular gradient (? Valve/patient mismatch) with recent 45 mmHg mean gradient 03/2019 (hemoglobin 8.2).  The previous mean gradient in August 2020 was 32 mmHg.  Had recurrent GI bleeding late last year.  This occurred off aspirin.  Significant dyspnea on exertion at the time has improved.  The gradient noted on the most recent echo was increased compared to prior.  The prior echo was done without the patient being anemic.  Since recovering from the anemia, she feels better.  She is able to care for herself and do her required household activities without limitations.  She denies angina, orthopnea, PND.  Past Medical History:  Diagnosis Date  . Acute lower GI bleeding 02/17/2019  . Hyperplastic colon polyp 2009  . Hypertension   . Melanoma ([place]) 5-6 yrs ago  . Raynaud's disease   . S/P AVR    Echo 03/2019: EF 65-70, normal wall motion, mild asymmetric LVH, AVR with severe stenosis (mean gradient 45 mmHg), mild RAE, mild MR.  Change in gradient may be explained by anemia.  Clinical correlation needed.  . Sciatic nerve disease, left     Past Surgical History:  Procedure Laterality Date  . ABDOMINAL HYSTERECTOMY  yrs ago  . BILATERAL OOPHORECTOMY  2008  . BIOPSY  02/19/2019   Procedure: BIOPSY;  Surgeon: Otis Brace, MD;  Location: [facility];  Service: Gastroenterology;;  . BREAST EXCISIONAL BIOPSY Right   . CARDIAC VALVE REPLACEMENT  7 yrs ago   aortic bovine valeve replave replacement   . COLONOSCOPY WITH PROPOFOL N/A 07/10/2016   Procedure: COLONOSCOPY  WITH PROPOFOL;  Surgeon: Garlan Fair, MD;  Location: [facility];  Service: Endoscopy;  Laterality: N/A;  . COLONOSCOPY WITH PROPOFOL N/A 02/19/2019   Procedure: COLONOSCOPY WITH PROPOFOL;  Surgeon: Otis Brace, MD;  Location: [facility];  Service: Gastroenterology;  Laterality: N/A;  . ESOPHAGOGASTRODUODENOSCOPY (EGD) WITH PROPOFOL N/A 02/19/2019   Procedure: ESOPHAGOGASTRODUODENOSCOPY (EGD) WITH PROPOFOL;  Surgeon: Otis Brace, MD;  Location: [facility];  Service: Gastroenterology;  Laterality: N/A;  . EYE SURGERY Bilateral    ioc for cataracts  . HEMOSTASIS CLIP PLACEMENT  02/19/2019   Procedure: HEMOSTASIS CLIP PLACEMENT;  Surgeon: Otis Brace, MD;  Location: [facility];  Service: Gastroenterology;;  . HOT HEMOSTASIS N/A 02/19/2019   Procedure: HOT HEMOSTASIS (ARGON PLASMA COAGULATION/BICAP);  Surgeon: Otis Brace, MD;  Location: [facility];  Service: Gastroenterology;  Laterality: N/A;  . POLYPECTOMY  02/19/2019   Procedure: POLYPECTOMY;  Surgeon: Otis Brace, MD;  Location: [facility];  Service: Gastroenterology;;    Current Medications: Current Meds  Medication Sig  . amLODipine (NORVASC) 5 MG tablet Take 5 mg by mouth daily.  . cephALEXin (KEFLEX) 500 MG capsule Take 2,000 mg by mouth as needed (Prior to [place]).   . cholecalciferol (VITAMIN D) 1000 UNITS tablet Take 1,000 Units by mouth daily.  . fluticasone (FLONASE) 50 MCG/ACT nasal spray Place 2 sprays into the nose daily. Only during allergy season  . losartan-hydrochlorothiazide (HYZAAR) 100-25 MG  tablet Take 1 tablet by mouth daily.  . metoprolol succinate (TOPROL-XL) 25 MG 24 hr tablet Take 25 mg by mouth daily.  . pantoprazole (PROTONIX) 40 MG tablet Take 40 mg by mouth 2 (two) times daily.   . [DISCONTINUED] losartan-hydrochlorothiazide (HYZAAR) 50-12.5 MG tablet Take 1 tablet by mouth daily.     Allergies:   Ace inhibitors, Sulfa antibiotics, and  Amlodipine   Social History   Socioeconomic History  . Marital status: Widowed    Spouse name: Not on file  . Number of children: Not on file  . Years of education: Not on file  . Highest education level: Not on file  Occupational History  . Occupation: retired  Tobacco Use  . Smoking status: Former Smoker    Packs/day: 1.00    Years: 40.00    Pack years: 40.00    Types: Cigarettes    Quit date: 2005    Years since quitting: 16.1  . Smokeless tobacco: Never Used  Substance and Sexual Activity  . Alcohol use: Yes    Alcohol/week: 0.0 standard drinks    Comment: 1 glass wine per day  . Drug use: No  . Sexual activity: Not on file  Other Topics Concern  . Not on file  Social History Narrative  . Not on file   Social Determinants of Health   Financial Resource Strain:   . Difficulty of Paying Living Expenses: Not on file  Food Insecurity:   . Worried About Charity fundraiser in the Last Year: Not on file  . Ran Out of Food in the Last Year: Not on file  Transportation Needs:   . Lack of Transportation (Medical): Not on file  . Lack of Transportation (Non-Medical): Not on file  Physical Activity:   . Days of Exercise per Week: Not on file  . Minutes of Exercise per Session: Not on file  Stress:   . Feeling of Stress : Not on file  Social Connections:   . Frequency of Communication with Friends and Family: Not on file  . Frequency of Social Gatherings with Friends and Family: Not on file  . Attends Religious Services: Not on file  . Active Member of Clubs or Organizations: Not on file  . Attends Archivist Meetings: Not on file  . Marital Status: Not on file     Family History: The patient's family history includes Breast cancer in her mother; Cancer in her father; Healthy in her sister; Heart failure in her mother; Hypertension in her brother, father, sister, and sister; Osteoporosis in her sister.  ROS:   Please see the history of present illness.      Blood in the stool.  Aspirin was discontinued several years ago after the first significant GI bleed.  All other systems reviewed and are negative.  EKGs/Labs/Other Studies Reviewed:    The following studies were reviewed today: 2D Doppler echocardiogram 2020 IMPRESSIONS    1. 21 mm prosthetic valve present in aortic position. Vmax 4.3 m/s, mean  gradient 45 mmHG, EOA 0.61 cm2, DI 0.25. Jet contour is triangular (AT 77  msec). LVOT VTI is increased ~29 cm and review of chart shows the patient  has anemia (Hgb=8.2). The  gradients are in the severe range, but this could be related to anemia.  The mean gradient 09/24/2018 was 32 mmHG and anemia could explain the  higher gradients on this study (unknown Hgb at the time of that study).  There is no regurgitation of  the  prosthetic valve on this study or the prior study. Overall, clinical  correlation is needed. 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  There is a 21 mm bioprosthetic valve present in the aortic position.  Procedure Date: 07/05/2009.  2. Left ventricular ejection fraction, by estimation, is 65 to 70%. The  left ventricle has normal function. The left ventricle has no regional  wall motion abnormalities. There is mild asymmetric left ventricular  hypertrophy of the basal-septal segment.  Left ventricular diastolic function could not be evaluated.  3. Right ventricular systolic function is normal. The right ventricular  size is normal.  4. Right atrial size was mildly dilated.  5. The mitral valve is grossly normal. Mild mitral valve regurgitation.  No evidence of mitral stenosis.   Comparison(s): A prior study was performed on 09/24/2018. Changes from  prior study are noted.   EKG:  EKG not performed today  Recent Labs: 02/17/2019: ALT 17 02/20/2019: BUN 8; Creatinine, Ser 0.85; Hemoglobin 8.2;  Platelets 237; Potassium 3.6; Sodium 141  Recent Lipid Panel No results found for: CHOL, TRIG, HDL, CHOLHDL, VLDL, LDLCALC, LDLDIRECT  Physical Exam:    VS:  BP 126/76   Pulse 67   Ht 5' 3.5" (1.613 m)   Wt 154 lb 12.8 oz (70.2 kg)   SpO2 98%   BMI 26.99 kg/m     Wt Readings from Last 3 Encounters:  04/07/19 154 lb 12.8 oz (70.2 kg)  02/24/19 161 lb 12.8 oz (73.4 kg)  02/17/19 156 lb 1.4 oz (70.8 kg)     GEN: Mild obesity. No acute distress HEENT: Normal NECK: No JVD. LYMPHATICS: No lymphadenopathy CARDIAC: 3/6 crescendo decrescendo systolic murmur.  RRR without diastolic murmur, gallop, or edema. VASCULAR:  Normal Pulses. No bruits. RESPIRATORY:  Clear to auscultation without rales, wheezing or rhonchi  ABDOMEN: Soft, non-tender, non-distended, No pulsatile mass, MUSCULOSKELETAL: No deformity  SKIN: Warm and dry NEUROLOGIC:  Alert and oriented x 3 PSYCHIATRIC:  Normal affect   ASSESSMENT:    1. History of aortic valve replacement with bioprosthetic valve   2. Shortness of breath   3. Lower GI bleed   4. Essential hypertension   5. Educated about COVID-19 virus infection    PLAN:    In order of problems listed above:  1. Sginificant increase in AV prosthesis gradient at the time of hemoglobin below 8.5.  She is clinically doing well.  We will repeat an echocardiogram when hemoglobin is improved.  This echocardiogram will be done prior to the next visit in 6 months.  The velocity across the aortic valve under the circumstances of low hemoglobin was 4.3 m/s. 2. Combination of diastolic dysfunction, restrictive valve function, and anemia.  No evidence of volume overload. 3. AV malformations have caused significant bleeding on 2 occasions. 4. Blood pressure 5. Vaccination has been received.  3W's is being practiced.   Medication Adjustments/Labs and Tests Ordered: Current medicines are reviewed at length with the patient today.  Concerns regarding medicines are  outlined above.  No orders of the defined types were placed in this encounter.  No orders of the defined types were placed in this encounter.   Patient Instructions  Medication Instructions:  Your physician recommends that you continue on your current medications as directed. Please refer to the Current Medication list given to you today.  *If you need  a refill on your cardiac medications before your next appointment, please call your pharmacy*   Lab Work: None If you have labs (blood work) drawn today and your tests are completely normal, you will receive your results only by: Marland Kitchen MyChart Message (if you have MyChart) OR . A paper copy in the mail If you have any lab test that is abnormal or we need to change your treatment, we will call you to review the results.   Testing/Procedures: Your physician has requested that you have an echocardiogram 1-2 weeks prior to seeing Dr. Tamala Julian back. Echocardiography is a painless test that uses sound waves to create images of your heart. It provides your doctor with information about the size and shape of your heart and how well your heart's chambers and valves are working. This procedure takes approximately one hour. There are no restrictions for this procedure.     Follow-Up: At [facility], you and your health needs are our priority.  As part of our continuing mission to provide you with exceptional heart care, we have created designated Provider Care Teams.  These Care Teams include your primary Cardiologist (physician) and Advanced Practice Providers (APPs -  Physician Assistants and Nurse Practitioners) who all work together to provide you with the care you need, when you need it.  We recommend signing up for the patient portal called "MyChart".  Sign up information is provided on this After Visit Summary.  MyChart is used to connect with patients for Virtual Visits (Telemedicine).  Patients are able to view lab/test results, encounter  notes, upcoming appointments, etc.  Non-urgent messages can be sent to your provider as well.   To learn more about what you can do with MyChart, go to [email or web address].    Your next appointment:   6 month(s)  The format for your next appointment:   In Person  Provider:   You may see Sinclair Grooms, MD or one of the following Advanced Practice Providers on your designated Care Team:    Truitt Merle, NP  Cecilie Kicks, NP  Kathyrn Drown, NP    Other Instructions      Signed, Sinclair Grooms, MD  04/07/2019 3:42 PM    [place]

## 2019-04-01 DIAGNOSIS — D509 Iron deficiency anemia, unspecified: Secondary | ICD-10-CM | POA: Diagnosis not present

## 2019-04-07 ENCOUNTER — Ambulatory Visit: Payer: Medicare HMO | Admitting: Interventional Cardiology

## 2019-04-07 ENCOUNTER — Encounter: Payer: Self-pay | Admitting: Interventional Cardiology

## 2019-04-07 ENCOUNTER — Other Ambulatory Visit: Payer: Self-pay

## 2019-04-07 VITALS — BP 126/76 | HR 67 | Ht 63.5 in | Wt 154.8 lb

## 2019-04-07 DIAGNOSIS — Z7189 Other specified counseling: Secondary | ICD-10-CM

## 2019-04-07 DIAGNOSIS — I10 Essential (primary) hypertension: Secondary | ICD-10-CM | POA: Diagnosis not present

## 2019-04-07 DIAGNOSIS — Z953 Presence of xenogenic heart valve: Secondary | ICD-10-CM

## 2019-04-07 DIAGNOSIS — R0602 Shortness of breath: Secondary | ICD-10-CM

## 2019-04-07 DIAGNOSIS — K922 Gastrointestinal hemorrhage, unspecified: Secondary | ICD-10-CM

## 2019-04-07 NOTE — Patient Instructions (Signed)
Medication Instructions:  Your physician recommends that you continue on your current medications as directed. Please refer to the Current Medication list given to you today.  *If you need a refill on your cardiac medications before your next appointment, please call your pharmacy*   Lab Work: None If you have labs (blood work) drawn today and your tests are completely normal, you will receive your results only by: Marland Kitchen MyChart Message (if you have MyChart) OR . A paper copy in the mail If you have any lab test that is abnormal or we need to change your treatment, we will call you to review the results.   Testing/Procedures: Your physician has requested that you have an echocardiogram 1-2 weeks prior to seeing Dr. Tamala Julian back. Echocardiography is a painless test that uses sound waves to create images of your heart. It provides your doctor with information about the size and shape of your heart and how well your heart's chambers and valves are working. This procedure takes approximately one hour. There are no restrictions for this procedure.     Follow-Up: At [facility], you and your health needs are our priority.  As part of our continuing mission to provide you with exceptional heart care, we have created designated Provider Care Teams.  These Care Teams include your primary Cardiologist (physician) and Advanced Practice Providers (APPs -  Physician Assistants and Nurse Practitioners) who all work together to provide you with the care you need, when you need it.  We recommend signing up for the patient portal called "MyChart".  Sign up information is provided on this After Visit Summary.  MyChart is used to connect with patients for Virtual Visits (Telemedicine).  Patients are able to view lab/test results, encounter notes, upcoming appointments, etc.  Non-urgent messages can be sent to your provider as well.   To learn more about what you can do with MyChart, go to [email or web address].     Your next appointment:   6 month(s)  The format for your next appointment:   In Person  Provider:   You may see Sinclair Grooms, MD or one of the following Advanced Practice Providers on your designated Care Team:    Truitt Merle, NP  Cecilie Kicks, NP  Kathyrn Drown, NP    Other Instructions

## 2019-04-08 DIAGNOSIS — I10 Essential (primary) hypertension: Secondary | ICD-10-CM | POA: Diagnosis not present

## 2019-04-08 DIAGNOSIS — I480 Paroxysmal atrial fibrillation: Secondary | ICD-10-CM | POA: Diagnosis not present

## 2019-04-08 DIAGNOSIS — D500 Iron deficiency anemia secondary to blood loss (chronic): Secondary | ICD-10-CM | POA: Diagnosis not present

## 2019-04-08 DIAGNOSIS — D509 Iron deficiency anemia, unspecified: Secondary | ICD-10-CM | POA: Diagnosis not present

## 2019-04-26 DIAGNOSIS — Z85820 Personal history of malignant melanoma of skin: Secondary | ICD-10-CM | POA: Diagnosis not present

## 2019-04-26 DIAGNOSIS — D1801 Hemangioma of skin and subcutaneous tissue: Secondary | ICD-10-CM | POA: Diagnosis not present

## 2019-04-26 DIAGNOSIS — Z85828 Personal history of other malignant neoplasm of skin: Secondary | ICD-10-CM | POA: Diagnosis not present

## 2019-04-26 DIAGNOSIS — L820 Inflamed seborrheic keratosis: Secondary | ICD-10-CM | POA: Diagnosis not present

## 2019-04-26 DIAGNOSIS — L814 Other melanin hyperpigmentation: Secondary | ICD-10-CM | POA: Diagnosis not present

## 2019-04-26 DIAGNOSIS — L570 Actinic keratosis: Secondary | ICD-10-CM | POA: Diagnosis not present

## 2019-04-26 DIAGNOSIS — L821 Other seborrheic keratosis: Secondary | ICD-10-CM | POA: Diagnosis not present

## 2019-05-10 DIAGNOSIS — R69 Illness, unspecified: Secondary | ICD-10-CM | POA: Diagnosis not present

## 2019-07-27 DIAGNOSIS — D692 Other nonthrombocytopenic purpura: Secondary | ICD-10-CM | POA: Diagnosis not present

## 2019-07-27 DIAGNOSIS — L814 Other melanin hyperpigmentation: Secondary | ICD-10-CM | POA: Diagnosis not present

## 2019-07-27 DIAGNOSIS — L821 Other seborrheic keratosis: Secondary | ICD-10-CM | POA: Diagnosis not present

## 2019-07-27 DIAGNOSIS — Z85820 Personal history of malignant melanoma of skin: Secondary | ICD-10-CM | POA: Diagnosis not present

## 2019-07-27 DIAGNOSIS — D1801 Hemangioma of skin and subcutaneous tissue: Secondary | ICD-10-CM | POA: Diagnosis not present

## 2019-07-27 DIAGNOSIS — Z85828 Personal history of other malignant neoplasm of skin: Secondary | ICD-10-CM | POA: Diagnosis not present

## 2019-07-27 DIAGNOSIS — L570 Actinic keratosis: Secondary | ICD-10-CM | POA: Diagnosis not present

## 2019-07-28 DIAGNOSIS — D500 Iron deficiency anemia secondary to blood loss (chronic): Secondary | ICD-10-CM | POA: Diagnosis not present

## 2019-07-28 DIAGNOSIS — D509 Iron deficiency anemia, unspecified: Secondary | ICD-10-CM | POA: Diagnosis not present

## 2019-07-28 DIAGNOSIS — I480 Paroxysmal atrial fibrillation: Secondary | ICD-10-CM | POA: Diagnosis not present

## 2019-07-28 DIAGNOSIS — I10 Essential (primary) hypertension: Secondary | ICD-10-CM | POA: Diagnosis not present

## 2019-08-15 ENCOUNTER — Other Ambulatory Visit: Payer: Self-pay | Admitting: Interventional Cardiology

## 2019-08-23 DIAGNOSIS — D500 Iron deficiency anemia secondary to blood loss (chronic): Secondary | ICD-10-CM | POA: Diagnosis not present

## 2019-08-23 DIAGNOSIS — I10 Essential (primary) hypertension: Secondary | ICD-10-CM | POA: Diagnosis not present

## 2019-08-25 DIAGNOSIS — R69 Illness, unspecified: Secondary | ICD-10-CM | POA: Diagnosis not present

## 2019-09-01 DIAGNOSIS — R69 Illness, unspecified: Secondary | ICD-10-CM | POA: Diagnosis not present

## 2019-09-24 ENCOUNTER — Other Ambulatory Visit: Payer: Self-pay

## 2019-09-24 ENCOUNTER — Ambulatory Visit (HOSPITAL_COMMUNITY): Payer: Medicare HMO | Attending: Cardiovascular Disease

## 2019-09-24 DIAGNOSIS — Z953 Presence of xenogenic heart valve: Secondary | ICD-10-CM | POA: Insufficient documentation

## 2019-09-24 LAB — ECHOCARDIOGRAM COMPLETE
AR max vel: 0.58 cm2
AV Area VTI: 0.59 cm2
AV Area mean vel: 0.59 cm2
AV Mean grad: 43 mmHg
AV Peak grad: 76.4 mmHg
Ao pk vel: 4.37 m/s
Area-P 1/2: 1.82 cm2
S' Lateral: 2.4 cm

## 2019-09-25 ENCOUNTER — Other Ambulatory Visit: Payer: Self-pay | Admitting: Interventional Cardiology

## 2019-09-26 NOTE — Progress Notes (Signed)
Cardiology Office Note:    Date:  09/29/2019   ID:  [name], DOB Dec 19, 1950, MRN [id number]  PCP:  Lavone Orn, MD  Cardiologist:  Sinclair Grooms, MD   Referring MD: Lavone Orn, MD   Chief Complaint  Patient presents with  . Cardiac Valve Problem    History of Present Illness:    [name] is a 69 y.o. female with a hx of a hx of aortic valve replacement2011( tissue), increased transvalvular gradient (? Valve/patient mismatch vs valve degeneration) - Mean gradient in 32 mmHg.(8/20220) -> 45 mmHg (03/2019) with hemoglobin 8.2 -> 43 mmHg (08/2019). 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  [name] will be moving to [facility].  She wants to be established with Dr. Milbert Coulter. Vennie Homans, MD at [facility]: [facility], [address] [place]. [place], [place], [place] [address].  This move allow her to be closer to family.  She has an identical twin and lives in [place] and has had aortic valve replacement and history of GI bleeding related to AV malformation.  She is doing well.  She has minimal cardiac complaints.  We have been tracking elevated transvalvular velocities for several years.  Her current mean transvalvular gradient is 40 mmHg based upon the most recent echo.  She denies dyspnea, orthopnea, PND, angina, but does have some lower extremity swelling.  She has not had significant palpitations or other cardiac complaints.  No chills or fever.    Past Medical History:  Diagnosis Date  . Acute lower GI bleeding 02/17/2019  . Hyperplastic colon polyp 2009  . Hypertension   . Melanoma ([place]) 5-6 yrs ago  . Raynaud's disease   . S/P AVR    Echo 03/2019: EF 65-70, normal wall motion, mild asymmetric LVH, AVR with severe stenosis (mean gradient 45 mmHg), mild RAE, mild MR.  Change in gradient may be explained by anemia.  Clinical correlation needed.  . Sciatic nerve disease, left     Past Surgical History:  Procedure  Laterality Date  . ABDOMINAL HYSTERECTOMY  yrs ago  . BILATERAL OOPHORECTOMY  2008  . BIOPSY  02/19/2019   Procedure: BIOPSY;  Surgeon: Otis Brace, MD;  Location: [facility];  Service: Gastroenterology;;  . BREAST EXCISIONAL BIOPSY Right   . CARDIAC VALVE REPLACEMENT  7 yrs ago   aortic bovine valeve replave replacement   . COLONOSCOPY WITH PROPOFOL N/A 07/10/2016   Procedure: COLONOSCOPY WITH PROPOFOL;  Surgeon: Garlan Fair, MD;  Location: [facility];  Service: Endoscopy;  Laterality: N/A;  . COLONOSCOPY WITH PROPOFOL N/A 02/19/2019   Procedure: COLONOSCOPY WITH PROPOFOL;  Surgeon: Otis Brace, MD;  Location: [place];  Service: Gastroenterology;  Laterality: N/A;  . ESOPHAGOGASTRODUODENOSCOPY (EGD) WITH PROPOFOL N/A 02/19/2019   Procedure: ESOPHAGOGASTRODUODENOSCOPY (EGD) WITH PROPOFOL;  Surgeon: Otis Brace, MD;  Location: [facility];  Service: Gastroenterology;  Laterality: N/A;  . EYE SURGERY Bilateral    ioc for cataracts  . HEMOSTASIS CLIP PLACEMENT  02/19/2019   Procedure: HEMOSTASIS CLIP PLACEMENT;  Surgeon: Otis Brace, MD;  Location: [place];  Service: Gastroenterology;;  . HOT HEMOSTASIS N/A 02/19/2019   Procedure: HOT HEMOSTASIS (ARGON PLASMA COAGULATION/BICAP);  Surgeon: Otis Brace, MD;  Location: [facility];  Service: Gastroenterology;  Laterality: N/A;  . POLYPECTOMY  02/19/2019   Procedure: POLYPECTOMY;  Surgeon: Otis Brace, MD;  Location: [facility];  Service: Gastroenterology;;    Current Medications: Current Meds  Medication Sig  . amLODipine (NORVASC) 5 MG  tablet TAKE 1 TABLET BY MOUTH EVERY DAY  . cephALEXin (KEFLEX) 500 MG capsule Take 2,000 mg by mouth as needed (Prior to [place]).   . cholecalciferol (VITAMIN D) 1000 UNITS tablet Take 1,000 Units by mouth daily.  . fluticasone (FLONASE) 50 MCG/ACT nasal spray Place 2 sprays into the nose daily. Only during allergy season  .  losartan-hydrochlorothiazide (HYZAAR) 100-25 MG tablet TAKE 1 TABLET BY MOUTH EVERY DAY  . metoprolol succinate (TOPROL-XL) 25 MG 24 hr tablet TAKE 1 TABLET BY MOUTH EVERY DAY  . pantoprazole (PROTONIX) 40 MG tablet Take 40 mg by mouth 2 (two) times daily.      Allergies:   Ace inhibitors, Sulfa antibiotics, and Amlodipine   Social History   Socioeconomic History  . Marital status: Widowed    Spouse name: Not on file  . Number of children: Not on file  . Years of education: Not on file  . Highest education level: Not on file  Occupational History  . Occupation: retired  Tobacco Use  . Smoking status: Former Smoker    Packs/day: 1.00    Years: 40.00    Pack years: 40.00    Types: Cigarettes    Quit date: 2005    Years since quitting: 16.6  . Smokeless tobacco: Never Used  Vaping Use  . Vaping Use: Never used  Substance and Sexual Activity  . Alcohol use: Yes    Alcohol/week: 0.0 standard drinks    Comment: 1 glass wine per day  . Drug use: No  . Sexual activity: Not on file  Other Topics Concern  . Not on file  Social History Narrative  . Not on file   Social Determinants of Health   Financial Resource Strain:   . Difficulty of Paying Living Expenses: Not on file  Food Insecurity:   . Worried About Charity fundraiser in the Last Year: Not on file  . Ran Out of Food in the Last Year: Not on file  Transportation Needs:   . Lack of Transportation (Medical): Not on file  . Lack of Transportation (Non-Medical): Not on file  Physical Activity:   . Days of Exercise per Week: Not on file  . Minutes of Exercise per Session: Not on file  Stress:   . Feeling of Stress : Not on file  Social Connections:   . Frequency of Communication with Friends and Family: Not on file  . Frequency of Social Gatherings with Friends and Family: Not on file  . Attends Religious Services: Not on file  . Active Member of Clubs or Organizations: Not on file  . Attends Archivist  Meetings: Not on file  . Marital Status: Not on file     Family History: The patient's family history includes Breast cancer in her mother; Cancer in her father; Healthy in her sister; Heart failure in her mother; Hypertension in her brother, father, sister, and sister; Osteoporosis in her sister.  ROS:   Please see the history of present illness.    She is unaware of elevated cholesterol.  Her blood pressures are higher than they should be.  All other systems reviewed and are negative.  EKGs/Labs/Other Studies Reviewed:    The following studies were reviewed today:  2 D Doppler ECHO 09/24/2019: IMPRESSIONS  1. Left ventricular ejection fraction, by estimation, is 55 to 60%. The  left ventricle has normal function. The left ventricle has no regional  wall motion abnormalities. There is moderate left  ventricular hypertrophy.  Left ventricular diastolic  parameters are consistent with Grade I diastolic dysfunction (impaired  relaxation). There is the interventricular septum is flattened in systole  and diastole, consistent with right ventricular pressure and volume  overload.  2. Right ventricular systolic function is normal. The right ventricular  size is normal. There is normal pulmonary artery systolic pressure.  3. The mitral valve is normal in structure. No evidence of mitral valve  regurgitation. No evidence of mitral stenosis.  4. Prosthetic aortic valve DVI 0.34. Acceleration time 97 ms with a  triangular contour. There is some thickening of the prosthetic aortic  valve leaflets. No significant change since the echo 03/2019. Findings are  most consistent with high flow or  patient-prosthesis mismatch. The aortic valve has been repaired/replaced.  Aortic valve regurgitation is not visualized. No aortic stenosis is  present. There is a bioprosthetic valve present in the aortic position.  Aortic valve area, by VTI measures 0.59  cm. Aortic valve mean gradient measures 43.0  mmHg. Aortic valve Vmax  measures 4.37 m/s.  5. The inferior vena cava is normal in size with greater than 50%  respiratory variability, suggesting right atrial pressure of 3 mmHg.    EKG:  EKG performed February 25, 2019 demonstrates sinus bradycardia at 58 bpm, incomplete right bundle, left axis deviation, low voltage, and nonspecific ST abnormality.  Low voltage in the presence of aortic valve disease raises the question of amyloid.  Recent Labs: 02/17/2019: ALT 17 02/20/2019: BUN 8; Creatinine, Ser 0.85; Hemoglobin 8.2; Platelets 237; Potassium 3.6; Sodium 141  Recent Lipid Panel No results found for: CHOL, TRIG, HDL, CHOLHDL, VLDL, LDLCALC, LDLDIRECT  Physical Exam:    VS:  BP (!) 146/86   Pulse 72   Ht 5' 3.5" (1.613 m)   Wt 156 lb 3.2 oz (70.9 kg)   SpO2 97%   BMI 27.24 kg/m     Wt Readings from Last 3 Encounters:  09/29/19 156 lb 3.2 oz (70.9 kg)  04/07/19 154 lb 12.8 oz (70.2 kg)  02/24/19 161 lb 12.8 oz (73.4 kg)     GEN: Mildly overweight. No acute distress HEENT: Normal NECK: No JVD. LYMPHATICS: No lymphadenopathy CARDIAC: 2/6 crescendo decrescendo right upper sternal systolic murmur.  RRR without diastolic murmur, gallop, or edema. VASCULAR: Bilaterally radiated carotid bruits. Normal Pulses. No bruits. RESPIRATORY:  Clear to auscultation without rales, wheezing or rhonchi  ABDOMEN: Soft, non-tender, non-distended, No pulsatile mass, MUSCULOSKELETAL: No deformity  SKIN: Warm and dry NEUROLOGIC:  Alert and oriented x 3 PSYCHIATRIC:  Normal affect   ASSESSMENT:    1. History of aortic valve replacement with bioprosthetic valve   2. Shortness of breath   3. Lower GI bleed   4. Essential hypertension   5. Hyperlipidemia LDL goal <70   6. Educated about COVID-19 virus infection    PLAN:    In order of problems listed above:  1. Elevated velocities across the bioprosthetic aortic valve secondary to prosthesis/patient mismatch and now some progressive  thickening/degeneration of the tissue leaflets.  She is asymptomatic.  Velocity is stable over the last 3 years.  We will continue to follow.  She will establish with Dr. Vennie Homans and [facility] later this month. 2. Shortness of breath is unchanged.  After anemia improved, shortness of breath became less of an issue. 3. Lower GI bleeding secondary to AV malformations, resolved. 4. Blood pressure is higher than it should be.  Greater than 150/85 mmHg.  Start Aldactone  12.5 mg on Monday, Wednesday, and Friday..  Basic metabolic panel in 1 week. 5. Start Crestor 5 mg/day for elevated LDL cholesterol identified over a year and a half ago.  LDL is greater than 130.  This is for primary prevention to avoid progression of atherosclerosis.  Will need to have a liver and lipid panel performed by Dr. Vennie Homans in October November timeframe. 6. She has been vaccinated.  She is practicing social distancing.  Overall education and awareness concerning primary risk prevention was discussed in detail: LDL less than 70, hemoglobin A1c less than 7, blood pressure target less than 130/80 mmHg, >150 minutes of moderate aerobic activity per week, avoidance of smoking, weight control (via diet and exercise), and continued surveillance/management of/for obstructive sleep apnea.    Medication Adjustments/Labs and Tests Ordered: Current medicines are reviewed at length with the patient today.  Concerns regarding medicines are outlined above.  Orders Placed This Encounter  Procedures  . Basic metabolic panel   Meds ordered this encounter  Medications  . DISCONTD: spironolactone (ALDACTONE) 25 MG tablet    Sig: Take 0.5 tablets (12.5 mg total) by mouth daily.    Dispense:  45 tablet    Refill:  3  . rosuvastatin (CRESTOR) 5 MG tablet    Sig: Take 1 tablet (5 mg total) by mouth daily.    Dispense:  90 tablet    Refill:  3  . spironolactone (ALDACTONE) 25 MG tablet    Sig: Take 0.5 tablets (12.5 mg  total) by mouth every Monday, Wednesday, and Friday.    Dispense:  30 tablet    Refill:  3    Patient Instructions  Medication Instructions:  1) START Spironolactone 12.5mg  once daily on Monday, Wednesday and Friday. 2) START Rosuvastatin 5mg  once daily.  *If you need a refill on your cardiac medications before your next appointment, please call your pharmacy*   Lab Work: BMET next Wednesday  If you have labs (blood work) drawn today and your tests are completely normal, you will receive your results only by: Marland Kitchen MyChart Message (if you have MyChart) OR . A paper copy in the mail If you have any lab test that is abnormal or we need to change your treatment, we will call you to review the results.   Testing/Procedures: None   Follow-Up: At [facility], you and your health needs are our priority.  As part of our continuing mission to provide you with exceptional heart care, we have created designated Provider Care Teams.  These Care Teams include your primary Cardiologist (physician) and Advanced Practice Providers (APPs -  Physician Assistants and Nurse Practitioners) who all work together to provide you with the care you need, when you need it.  We recommend signing up for the patient portal called "MyChart".  Sign up information is provided on this After Visit Summary.  MyChart is used to connect with patients for Virtual Visits (Telemedicine).  Patients are able to view lab/test results, encounter notes, upcoming appointments, etc.  Non-urgent messages can be sent to your provider as well.   To learn more about what you can do with MyChart, go to [email or web address].    Your next appointment:   As needed  The format for your next appointment:   In Person  Provider:   You may see Sinclair Grooms, MD or one of the following Advanced Practice Providers on your designated Care Team:    Truitt Merle, NP  Cecilie Kicks, NP  Kathyrn Drown,  NP    Other  Instructions      Signed, Sinclair Grooms, MD  09/29/2019 1:49 PM    [facility]

## 2019-09-29 ENCOUNTER — Encounter: Payer: Self-pay | Admitting: Interventional Cardiology

## 2019-09-29 ENCOUNTER — Other Ambulatory Visit: Payer: Self-pay

## 2019-09-29 ENCOUNTER — Ambulatory Visit (INDEPENDENT_AMBULATORY_CARE_PROVIDER_SITE_OTHER): Payer: Medicare HMO | Admitting: Interventional Cardiology

## 2019-09-29 VITALS — BP 146/86 | HR 72 | Ht 63.5 in | Wt 156.2 lb

## 2019-09-29 DIAGNOSIS — R0602 Shortness of breath: Secondary | ICD-10-CM

## 2019-09-29 DIAGNOSIS — Z953 Presence of xenogenic heart valve: Secondary | ICD-10-CM | POA: Diagnosis not present

## 2019-09-29 DIAGNOSIS — Z7189 Other specified counseling: Secondary | ICD-10-CM

## 2019-09-29 DIAGNOSIS — I10 Essential (primary) hypertension: Secondary | ICD-10-CM | POA: Diagnosis not present

## 2019-09-29 DIAGNOSIS — E785 Hyperlipidemia, unspecified: Secondary | ICD-10-CM

## 2019-09-29 DIAGNOSIS — K922 Gastrointestinal hemorrhage, unspecified: Secondary | ICD-10-CM | POA: Diagnosis not present

## 2019-09-29 MED ORDER — ROSUVASTATIN CALCIUM 5 MG PO TABS: 5.0000 mg | ORAL_TABLET | Freq: Every day | ORAL | 3 refills | Status: AC

## 2019-09-29 MED ORDER — SPIRONOLACTONE 25 MG PO TABS: 12.5000 mg | ORAL_TABLET | ORAL | 3 refills | Status: DC

## 2019-09-29 MED ORDER — SPIRONOLACTONE 25 MG PO TABS: 12.5000 mg | ORAL_TABLET | Freq: Every day | ORAL | 3 refills | Status: DC

## 2019-09-29 NOTE — Patient Instructions (Signed)
Medication Instructions:  1) START Spironolactone 12.5mg  once daily on Monday, Wednesday and Friday. 2) START Rosuvastatin 5mg  once daily.  *If you need a refill on your cardiac medications before your next appointment, please call your pharmacy*   Lab Work: BMET next Wednesday  If you have labs (blood work) drawn today and your tests are completely normal, you will receive your results only by: Marland Kitchen MyChart Message (if you have MyChart) OR . A paper copy in the mail If you have any lab test that is abnormal or we need to change your treatment, we will call you to review the results.   Testing/Procedures: None   Follow-Up: At [facility], you and your health needs are our priority.  As part of our continuing mission to provide you with exceptional heart care, we have created designated Provider Care Teams.  These Care Teams include your primary Cardiologist (physician) and Advanced Practice Providers (APPs -  Physician Assistants and Nurse Practitioners) who all work together to provide you with the care you need, when you need it.  We recommend signing up for the patient portal called "MyChart".  Sign up information is provided on this After Visit Summary.  MyChart is used to connect with patients for Virtual Visits (Telemedicine).  Patients are able to view lab/test results, encounter notes, upcoming appointments, etc.  Non-urgent messages can be sent to your provider as well.   To learn more about what you can do with MyChart, go to [email or web address].    Your next appointment:   As needed  The format for your next appointment:   In Person  Provider:   You may see Sinclair Grooms, MD or one of the following Advanced Practice Providers on your designated Care Team:    Truitt Merle, NP  Cecilie Kicks, NP  Kathyrn Drown, NP    Other Instructions

## 2019-10-01 ENCOUNTER — Other Ambulatory Visit: Payer: Self-pay | Admitting: *Deleted

## 2019-10-01 DIAGNOSIS — E785 Hyperlipidemia, unspecified: Secondary | ICD-10-CM

## 2019-10-06 ENCOUNTER — Other Ambulatory Visit: Payer: Self-pay

## 2019-10-06 ENCOUNTER — Other Ambulatory Visit: Payer: Medicare HMO | Admitting: *Deleted

## 2019-10-06 DIAGNOSIS — E785 Hyperlipidemia, unspecified: Secondary | ICD-10-CM

## 2019-10-06 DIAGNOSIS — R0602 Shortness of breath: Secondary | ICD-10-CM

## 2019-10-06 DIAGNOSIS — I10 Essential (primary) hypertension: Secondary | ICD-10-CM

## 2019-10-06 LAB — HEPATIC FUNCTION PANEL
ALT: 19 IU/L (ref 0–32)
AST: 17 IU/L (ref 0–40)
Albumin: 4.4 g/dL (ref 3.8–4.8)
Alkaline Phosphatase: 113 IU/L (ref 48–121)
Bilirubin Total: 1.1 mg/dL (ref 0.0–1.2)
Bilirubin, Direct: 0.28 mg/dL (ref 0.00–0.40)
Total Protein: 6.9 g/dL (ref 6.0–8.5)

## 2019-10-06 LAB — LIPID PANEL
Chol/HDL Ratio: 2.3 ratio (ref 0.0–4.4)
Cholesterol, Total: 203 mg/dL — ABNORMAL HIGH (ref 100–199)
HDL: 88 mg/dL (ref >39)
LDL Chol Calc (NIH): 98 mg/dL (ref 0–99)
Triglycerides: 99 mg/dL (ref 0–149)
VLDL Cholesterol Cal: 17 mg/dL (ref 5–40)

## 2019-10-06 LAB — BASIC METABOLIC PANEL
BUN/Creatinine Ratio: 21 (ref 12–28)
BUN: 16 mg/dL (ref 8–27)
CO2: 24 mmol/L (ref 20–29)
Calcium: 10 mg/dL (ref 8.7–10.3)
Chloride: 98 mmol/L (ref 96–106)
Creatinine, Ser: 0.78 mg/dL (ref 0.57–1.00)
GFR calc Af Amer: 89 mL/min/{1.73_m2} (ref >59)
GFR calc non Af Amer: 77 mL/min/{1.73_m2} (ref >59)
Glucose: 111 mg/dL — ABNORMAL HIGH (ref 65–99)
Potassium: 4.1 mmol/L (ref 3.5–5.2)
Sodium: 137 mmol/L (ref 134–144)

## 2019-11-30 LAB — PROTIME-INR
INR: 1.1 — ABNORMAL LOW (ref 1.5–3.5)
Protime: 13.9 seconds (ref 11.6–14.5)

## 2019-11-30 LAB — CBC
Hematocrit: 44 % (ref 34.0–47.0)
Hemoglobin: 14.8 g/dL (ref 11.5–15.7)
MCH: 31.2 pg (ref 27.0–34.5)
MCHC: 33.6 g/dL (ref 32.0–36.0)
MCV: 92.8 fL (ref 81.0–99.0)
MPV: 12.1 fL (ref 7.2–13.2)
NRBC Absolute: 0 10*3/uL (ref 0.000–0.012)
NRBC Automated: 0 % (ref 0.0–0.2)
Platelets: 264 10*3/uL (ref 140–440)
RBC: 4.74 x10e6/mcL (ref 3.60–5.20)
RDW: 12.7 % (ref 11.0–16.0)
WBC: 7.3 10*3/uL (ref 3.8–10.6)

## 2019-11-30 LAB — COMPREHENSIVE METABOLIC PANEL
ALT: 19 U/L (ref 0–33)
AST: 21 U/L (ref 0–32)
Albumin/Globulin Ratio: 2 mmol/L (ref 1.00–2.70)
Albumin: 4.7 g/dL (ref 3.5–5.2)
Alk Phosphatase: 95 U/L (ref 35–117)
Anion Gap: 15 mmol/L (ref 2–17)
BUN: 16 mg/dL (ref 8–23)
CO2: 25 mmol/L (ref 22–29)
Calcium: 9.9 mg/dL (ref 8.8–10.2)
Chloride: 99 mmol/L (ref 98–107)
Creatinine: 0.8 mg/dL (ref 0.5–1.0)
GFR African American: 87 mL/min/{1.73_m2} — ABNORMAL LOW (ref >=90)
GFR Non-African American: 75 mL/min/{1.73_m2} — ABNORMAL LOW (ref >=90)
Globulin: 2 g/dL (ref 1.9–4.4)
Glucose: 116 mg/dL — ABNORMAL HIGH (ref 70–99)
OSMOLALITY CALCULATED: 280 mOsm/kg (ref 270–287)
Potassium: 3.9 mmol/L (ref 3.5–5.3)
Sodium: 139 mmol/L (ref 135–145)
Total Bilirubin: 1.3 mg/dL — ABNORMAL HIGH (ref 0.00–1.20)
Total Protein: 7.1 g/dL (ref 6.4–8.3)

## 2019-11-30 LAB — HEMOGLOBIN A1C
Est. Avg. Glucose, WB: 117
Est. Avg. Glucose-calculated: 126
Hemoglobin A1C: 5.7 % (ref 4.0–6.0)

## 2019-11-30 LAB — LIPID PANEL
Chol/HDL Ratio: 2.3 (ref 0.0–4.4)
Cholesterol: 148 mg/dL (ref 100–200)
HDL: 63 mg/dL (ref >=50)
LDL Cholesterol: 64 mg/dL (ref 0.0–100.0)
LDL/HDL Ratio: 1
Triglycerides: 105 mg/dL (ref 0–149)
VLDL: 21 mg/dL (ref 5.0–40.0)

## 2019-11-30 LAB — TSH WITH REFLEX TO FT4: TSH: 1.16 mcIU/mL (ref 0.358–3.740)

## 2019-11-30 LAB — N TERMINAL PROBNP (AKA NTPROBNP): NT Pro-BNP: 197 pg/mL — ABNORMAL HIGH (ref 0–125)

## 2019-11-30 LAB — VITAMIN D 25 HYDROXY: Vit D, 25-Hydroxy: 52.8 ng/mL (ref 30.0–90.0)

## 2019-12-12 LAB — COVID-19 (COBAS): SARS-CoV-2, NAA: NEGATIVE

## 2019-12-15 NOTE — Progress Notes (Signed)
 Other Documents               Pt awake and alert and talking with staff.  Pt drinking w\o difficulty.  int d/c, tele d/c. Pt verbalized understanding of d/c instructions and is released to home w/family, escorted by hospital staff.  Signature [employer]

## 2019-12-15 NOTE — Nursing Note (Signed)
Nursing Discharge Summary - Text       Nursing Discharge Summary Entered On:  12/15/2019 13:06 EST    Performed On:  12/15/2019 13:05 EST by Olam Idler R-RN               DC Information   Discharge To :   Home independently   Mode of Discharge :   Wheelchair   Transportation :   Private vehicle   Accompanied By :   Leotis Pain R-RN - 12/15/2019 13:05 EST   Education   Responsible Learner(s) :   No Data Available     Teaching Method :   Explanation, Printed materials   Olam Idler R-RN - 12/15/2019 13:05 EST   Post-Hospital Education Adult Grid   Activity Expectations :   [employer] understanding   Bladder Management :   Verbalizes understanding   Bowel Management :   [employer] understanding   Community Resources :   [employer] understanding   Diagnostic Results :   Verbalizes understanding   Disease Process :   Verbalizes understanding   Equipment/Devices :   TEFL teacher understanding   Importance of Follow-Up Visits :   Verbalizes understanding   Invasive Line Care :   TEFL teacher understanding   Pain Management :   Verbalizes understanding   Physical Limitations :   Verbalizes understanding   Plan of Care :   Verbalizes understanding   Postoperative Instructions :   TEFL teacher understanding   Substance Abuse :   Verbalizes understanding   When to Call Health Care Provider :   Verbalizes understanding   Olam Idler R-RN - 12/15/2019 13:05 EST   Health Maintenance Education Adult Grid   Allergies :   TEFL teacher understanding   Bathing/Hygiene :   TEFL teacher understanding   Diet/Nutrition :   TEFL teacher understanding   Exercise :   TEFL teacher understanding   Immunizations :   TEFL teacher understanding   Oral Care :   TEFL teacher understanding   Second Higher education careers adviser Smoke :   Verbalizes understanding   Smoking Cessation :   Verbalizes understanding   Olam Idler R-RN - 12/15/2019 13:05 EST   Medication Education Adult Grid   Drug to Drug Interactions :   Verbalizes understanding    Drug to Food Interactions :   TEFL teacher understanding   Med Dosage, Route, Scheduling :   TEFL teacher understanding   Med Generic/Brand Name, Purpose, Action :   Verbalizes understanding   Med Preadministration Procedures :   [employer] understanding   Med Teacher, music, Storage :   [employer] understanding   Medication Precautions :   Environmental consultant :   IT sales professional, Medication :   Verbalizes understanding   Olam Idler R-RN - 12/15/2019 13:05 EST   Safety Education Adult Grid   Choking/CPR :   IT sales professional, Bathtub :   IT sales professional, Location manager Monoxide Detectors :   IT sales professional, Fall :   IT sales professional, Fire :   IT sales professional, Firearms :   IT sales professional, Latex :   IT sales professional, Smoke Detectors :   Verbalizes understanding   Olam Idler R-RN - 12/15/2019 13:05 EST   Time Spent Educating Patient :   15 minutes   Olam Idler R-RN - 12/15/2019 13:05 EST

## 2019-12-15 NOTE — Anesthesia Post-Procedure Evaluation (Signed)
Postanesthesia Evaluation        Patient:   Jodi Powell, Jodi Powell             MRN: [id number]            FIN: [phone]               Age:   69 years     Sex:  Female     DOB:  Nov 05, 1950   Associated Diagnoses:   None   Author:   Alfredo Bach,  Harlem Thresher-MD      Postoperative Information   Post Operative Info:       Patient location: PACU.       Assessment   Postanesthesia assessment   Respiratory function: Respiratory rate, airway, and oxygen saturation are at adequate levels.     Cardiovascular function: Heart Rate stable, Blood Pressure stable, Postoperative hydration status Adequate.     Mental status: appropriate for level of anesthesia.     Temperature: within normal limits.     Pain Control: Adequate.     Nausea/Vomiting: Absent.     Signature Line     Electronically Signed on 12/15/2019 11:23 AM EST   ________________________________________________   Alfredo Bach,  Dariya Gainer-MD

## 2019-12-15 NOTE — Anesthesia Pre-Procedure Evaluation (Signed)
Preanesthesia Evaluation        Patient:   Jodi Powell, Jodi Powell             MRN: [id number]            FIN: [id number]               Age:   69 years     Sex:  Female     DOB:  March 12, 1950   Associated Diagnoses:   None   Author:   Jeraldine Loots T-MD      Preoperative Information   Procedure/ Case: 69yo for tee   NPO:  NPO greater than 8 hours.    Anesthesia history     Patient's history: negative.     Family's history: negative.        Review of Systems   Respiratory:  Negative.    Cardiovascular:  Negative.       Health Status   Allergies:    Allergic Reactions (Selected)  Severe  Sulfa drugs- Headache.,    Allergies    (Active and Proposed Allergies Only)  sulfa drugs   (Severity: Severe, Onset: Unknown)   Reactions: Headache, Unknown     Current medications:    Home Medications (8) Active  amLODIPine 5 mg oral tablet 5 mg = 1 tabs, Oral, Daily  fluticasone 27.5 mcg/inh nasal spray 2 sprays, PRN, Nasal, Daily  losartan-hydrochlorothiazide 100 mg-25 mg oral tablet 1 tabs, Oral, Daily  metoprolol succinate 25 mg oral tablet, extended release 25 mg = 1 tabs, Oral, Daily  pantoprazole 40 mg oral delayed release tablet 40 mg = 1 tabs, Oral, Daily  rosuvastatin 5 mg oral tablet 5 mg = 1 tabs, Oral, Daily  spironolactone 25 mg oral tablet 12.5 mg = 0.5 tabs, Oral, Every other day  Vitamin D3 2000 intl units oral capsule , Oral, Daily  ,    Medications (1) Active  Scheduled: (0)  Continuous: (1)  Sodium Chloride 0.9% intravenous solution 75 mL  75 mL, IV, 75 mL/hr  PRN: (0)     Problem list:    Active Problems (4)  Aortic valve disease   GI bleed   Hypercholesterolemia   Hypertension   ,    Problems   (Active Problems Only)    Hypertensive disorder   (SNOMED CT: 4782956213, Onset: --)  Gastrointestinal hemorrhage   (SNOMED CT: 086578469, Onset: --)  Hypercholesterolemia   (SNOMED CT: 62952841, Onset: --)  Aortic valve disorder   (SNOMED CT: 32440102, Onset: --)        Histories   Past Medical History:    No active or resolved past  medical history items have been selected or recorded.   Procedure history:    aortic valve replaced.  appy.  hysterectomy.   Social History        Social & Psychosocial Habits    Alcohol  12/15/2019  Use: Current    Type: Wine    Frequency: Daily    Tobacco  12/15/2019  Use: Former smoker, quit more  .        Physical Examination      Vital Signs (last 24 hrs)_____  Last Charted___________  Weight      67.9 kg  (NOV 17 09:14)  BMI      26.52  (NOV 17 09:15)     Measurements from flowsheet : Measurements   12/15/2019 9:15 EST Body Mass Index est meas 26.52 kg/m2    Body Mass Index Measured 26.52 kg/m2  12/15/2019 9:14 EST Height/Length Estimated 160.02 cm    Weight Measured 67.9 kg    Weight Dosing 67.9 kg      General:          Stress: No acute distress.         Appearance: Within normal limits.    Airway:          Mallampati classification: II (soft palate, fauces, uvula visible).         Thyromental Distance: Normal.         Throat: Within normal limits.    Head:  Normocephalic, Atraumatic.    Neck:  Full range of motion.    Respiratory:  Lungs are clear to auscultation, Breath sounds are equal.    Cardiovascular:  Normal rate.    Gastrointestinal:  Deferred.    Musculoskeletal     Deferred.     Neurologic:  Alert.    Metabolic Equivalents in Exercise Testing      Review / Management   Results review:     No qualifying data available.    Chest x-ray results   ECG interpretation   Cardiology Results   Documentation reviewed      Assessment and Plan   American Society of Anesthesiologists#(ASA) physical status classification:  Class III.    Anesthetic Preoperative Plan     Anesthetic technique: Monitored anesthesia care, Intravenous.     Maintenance airway: natural.     Opioid Assessment: Opioid Na????ve.     Risks discussed: nausea, vomiting, sore throat, dental injury, hypotension, allergic reaction, serious complications.     Pt seen and examined and chart reviewed. R/C/B of GA  vs mac discussed and questions  answered. Pt understands and wishes to proceed.   Signature Line     Electronically Signed on 12/15/2019 09:58 AM EST   ________________________________________________   Jeraldine Loots T-MD

## 2019-12-15 NOTE — Nursing Note (Signed)
Adult Patient History Form-Text       Adult Patient History Entered On:  12/15/2019 10:06 EST    Performed On:  12/15/2019 9:42 EST by Olam Idler R-RN               General Info   Patient Identified :   Identification band, Verbal   Information Given By :   Self   Pregnancy Status :   N/A   In Clinical Trial With Signed Consent for Related Condition :   N/A   Olam Idler R-RN - 12/15/2019 9:42 EST   Allergies   (As Of: 12/15/2019 10:06:30 EST)   Allergies (Active)   sulfa drugs  Estimated Onset Date:   Unspecified ; Reactions:   Headache ; Created By:   Imagene Riches, RN, Unyimeabasi A; Reaction Status:   Active ; Category:   Drug ; Substance:   sulfa drugs ; Type:   Allergy ; Severity:   Severe ; Updated By:   Imagene Riches RN, Pieter Partridge; Reviewed Date:   12/15/2019 9:22 EST        Problem History   (As Of: 12/15/2019 10:06:30 EST)   Problems(Active)    Aortic valve disease (SNOMED CT  :36629476 )  Name of Problem:   Aortic valve disease ; Recorder:   Olam Idler R-RN; Confirmation:   Confirmed ; Classification:   Patient Stated ; Code:   54650354 ; Contributor System:   PowerChart ; Last Updated:   12/15/2019 9:55 EST ; Life Cycle Date:   12/15/2019 ; Life Cycle Status:   Active ; Vocabulary:   SNOMED CT        GI bleed (SNOMED CT  :656812751 )  Name of Problem:   GI bleed ; Recorder:   Olam Idler R-RN; Confirmation:   Confirmed ; Classification:   Patient Stated ; Code:   700174944 ; Contributor System:   Dietitian ; Last Updated:   12/15/2019 9:55 EST ; Life Cycle Date:   12/15/2019 ; Life Cycle Status:   Active ; Vocabulary:   SNOMED CT        Hypercholesterolemia (SNOMED CT  :96759163 )  Name of Problem:   Hypercholesterolemia ; Recorder:   Olam Idler R-RN; Confirmation:   Confirmed ; Classification:   Patient Stated ; Code:   84665993 ; Contributor System:   Dietitian ; Last Updated:   12/15/2019 9:55 EST ; Life Cycle Date:   12/15/2019 ; Life Cycle Status:   Active ;  Vocabulary:   SNOMED CT        Hypertension (SNOMED CT  :5701779390 )  Name of Problem:   Hypertension ; Recorder:   Olam Idler R-RN; Confirmation:   Confirmed ; Classification:   Patient Stated ; Code:   3009233007 ; Contributor System:   Dietitian ; Last Updated:   12/15/2019 9:54 EST ; Life Cycle Date:   12/15/2019 ; Life Cycle Status:   Active ; Vocabulary:   SNOMED CT          Procedure History        -    Procedure History   (As Of: 12/15/2019 10:06:30 EST)     Anesthesia Minutes:   0 ; Procedure Name:   hysterectomy ; Procedure Minutes:   0 ; Last Reviewed Dt/Tm:   12/15/2019 09:44:09 EST            Anesthesia Minutes:   0 ; Procedure Name:   aortic valve replaced ; Procedure Minutes:  0 ; Last Reviewed Dt/Tm:   12/15/2019 09:43:51 EST            Anesthesia Minutes:   0 ; Procedure Name:   appy ; Procedure Minutes:   0 ; Last Reviewed Dt/Tm:   12/15/2019 09:44:00 EST            Immunizations   Influenza Vaccine Status :   Received prior to admission, during current flu season   Olam Idler R-RN - 12/15/2019 9:42 EST   ID Risk Screen Symptoms   Recent Travel History :   No recent travel   Close Contact with COVID-19 ID :   No   Last 14 days COVID-19 ID :   Yes - Not Detected (negative)   TB Symptom Screen :   No symptoms   C. diff Symptom/History ID :   Neither of the above   Olam Idler R-RN - 12/15/2019 9:42 EST   Bloodless Medicine   Is Blood Transfusion Acceptable to Patient :   Yes   Olam Idler R-RN - 12/15/2019 9:42 EST   Nutrition   Nutrition Screen for Malnutrition :   Patient denies   Olam Idler R-RN - 12/15/2019 9:42 EST   Functional   ADLs Prior to Admission :   Independent   Olam Idler R-RN - 12/15/2019 9:42 EST   Social History   Social History   (As Of: 12/15/2019 10:06:30 EST)   Tobacco:        Tobacco use: Former smoker, quit more than 30 days ago.   (Last Updated: 12/15/2019 09:44:58 EST by Olam Idler R-RN)          Alcohol:         Current, Wine, Daily   (Last Updated: 12/15/2019 09:45:19 EST by Olam Idler R-RN)            Spiritual   Do you have a concern that you would like to address with a Chaplain? :   No   Do you have any religious/spiritual/cultural beliefs that could impact the way your care is provided? :   No   Olam Idler R-RN - 12/15/2019 9:42 EST   Harm Screen   Feels Safe Where Live :   Yes   Last 3 mo, thoughts killing self/others :   Patient denies   Olam Idler R-RN - 12/15/2019 9:42 EST   Advance Directive   Advance Directive :   Yes   Type of Advance Directive :   Living will   Olam Idler R-RN - 12/15/2019 9:42 EST   Education   Written Language :   [place]   Primary Language :   Gwyndolyn Kaufman R-RN - 12/15/2019 9:42 EST   Caregiver/Advocate Language   Patient :   None   Olam Idler R-RN - 12/15/2019 9:42 EST   Barriers to Learning :   None evident   Teaching Method :   Explanation   Olam Idler R-RN - 12/15/2019 9:42 EST   Preventative Measures Information   Unit/Room Orientation :   [place] Gam[place] understanding   Environmental Safety :   [place] understanding   Hand Washing :   Verbalizes understanding   Infection Prevention :   Verbalizes understanding   DVT Prophylaxis :   Verbalizes understanding   Isolation Precaution :   Verbalizes understanding   Olam Idler R-RN - 12/15/2019 9:42 EST   DC Needs  CM Living Situation :   Home with no services   Anticipated Discharge Needs :   None   Olam Idler R-RN - 12/15/2019 9:42 EST   Valuables and Belongings   Valuables and Belongings   At Bedside :   Clothes   Olam Idler R-RN - 12/15/2019 9:42 EST   Admission Complete   Admission Complete :   Yes   Olam Idler R-RN - 12/15/2019 9:42 EST

## 2019-12-27 NOTE — Nursing Note (Signed)
Adult Admission Assessment - Text       Perioperative Admission Assessment Entered On:  12/27/2019 12:56 EST    Performed On:  12/27/2019 12:52 EST by Julio Alm, RN, RUTHANN R               General   Information Given By :   Self   PAT Patient Procedure Verification :   Patient name and DOB confirmed with patient, Correct procedure scheduled confirmed with patient, Correct side/site confirmed with patient   Primary Care Physician/Specialists :   Dr. Suzie Portela  Dr. Claris Gladden   Day of Proc Supp Prsn is the Emerg Cont :   Yes   Day of Procedure Support Person Name :   Cindee Salt   Day of Procedure Support Person Phone :   [phone]   Day of Procedure Support Person Relationship :   brother   Languages :   Georgann Housekeeper, RN, Meredith Staggers R - 12/27/2019 12:52 EST   Allergies   (As Of: 12/27/2019 12:56:14 EST)   Allergies (Active)   Adhesive Bandage  Estimated Onset Date:   Unspecified ; Reactions:   Rash ; Created By:   Kyung Rudd, RN, STEPHANIE L; Reaction Status:   Active ; Category:   Drug ; Substance:   Adhesive Bandage ; Type:   Allergy ; Severity:   Unknown ; Updated By:   Kyung Rudd RN, Barkley Bruns; Reviewed Date:   12/27/2019 12:46 EST      sulfa drugs  Estimated Onset Date:   Unspecified ; Reactions:   Headache ; Created By:   Imagene Riches, RN, Unyimeabasi A; Reaction Status:   Active ; Category:   Drug ; Substance:   sulfa drugs ; Type:   Allergy ; Severity:   Severe ; Updated By:   Imagene Riches RN, Pieter Partridge; Reviewed Date:   12/27/2019 12:46 EST        Problem History   (As Of: 12/27/2019 12:56:14 EST)   Problems(Active)    Aortic valve disease (SNOMED CT  :20254270 )  Name of Problem:   Aortic valve disease ; Recorder:   Olam Idler R-RN; Confirmation:   Confirmed ; Classification:   Patient Stated ; Code:   62376283 ; Contributor System:   PowerChart ; Last Updated:   12/15/2019 9:55 EST ; Life Cycle Date:   12/15/2019 ; Life Cycle Status:   Active ; Vocabulary:   SNOMED CT        AVM (arteriovenous malformation) (SNOMED  CT  :15176160 )  Name of Problem:   AVM (arteriovenous malformation) ; Recorder:   DEEN, RN, RUTHANN R; Confirmation:   Confirmed ; Classification:   Patient Stated ; Code:   73710626 ; Contributor System:   Dietitian ; Last Updated:   12/27/2019 12:54 EST ; Life Cycle Date:   12/27/2019 ; Life Cycle Status:   Active ; Vocabulary:   SNOMED CT        GI bleed (SNOMED CT  :948546270 )  Name of Problem:   GI bleed ; Recorder:   Olam Idler R-RN; Confirmation:   Confirmed ; Classification:   Patient Stated ; Code:   350093818 ; Contributor System:   Dietitian ; Last Updated:   12/15/2019 9:55 EST ; Life Cycle Date:   12/15/2019 ; Life Cycle Status:   Active ; Vocabulary:   SNOMED CT        Hypercholesterolemia (SNOMED CT  :29937169 )  Name of Problem:   Hypercholesterolemia ; Recorder:   Thresa Ross,  Margaret R-RN; Confirmation:   Confirmed ; Classification:   Patient Stated ; Code:   01027253 ; Contributor System:   Dietitian ; Last Updated:   12/15/2019 9:55 EST ; Life Cycle Date:   12/15/2019 ; Life Cycle Status:   Active ; Vocabulary:   SNOMED CT        Hypertension (SNOMED CT  :6644034742 )  Name of Problem:   Hypertension ; Recorder:   Olam Idler R-RN; Confirmation:   Confirmed ; Classification:   Patient Stated ; Code:   5956387564 ; Contributor System:   Dietitian ; Last Updated:   12/15/2019 9:54 EST ; Life Cycle Date:   12/15/2019 ; Life Cycle Status:   Active ; Vocabulary:   SNOMED CT        Melanoma (SNOMED CT  :3329518841 )  Name of Problem:   Melanoma ; Recorder:   DEEN, RN, RUTHANN R; Confirmation:   Confirmed ; Classification:   Patient Stated ; Code:   6606301601 ; Contributor System:   PowerChart ; Last Updated:   12/27/2019 12:54 EST ; Life Cycle Date:   12/27/2019 ; Life Cycle Status:   Active ; Vocabulary:   SNOMED CT          Procedure History        -    Procedure History   (As Of: 12/27/2019 12:56:14 EST)     Anesthesia Minutes:   0 ; Procedure Name:   hysterectomy ; Procedure  Minutes:   0            Anesthesia Minutes:   0 ; Procedure Name:   aortic valve replaced ; Procedure Minutes:   0            Anesthesia Minutes:   0 ; Procedure Name:   appy ; Procedure Minutes:   0            Procedure Dt/Tm:   12/27/2019 ; Anesthesia Minutes:   0 ; Procedure Name:   Cardiac catheterization ; Procedure Minutes:   0 ; Last Reviewed Dt/Tm:   12/27/2019 12:55:11 EST            History Confirmation   Problem History Changes PAT :   No   Procedure History Changes PAT :   No   DEEN, RN, RUTHANN R - 12/27/2019 12:52 EST   Anesthesia/Sedation   Anesthesia History :   Prior general anesthesia   SN - Malignant Hyperthermia :   Denies   Previous Problem with Anesthesia :   None   Moderate Sedation History :   Prior sedation for procedure   Previous Problem With Sedation :   None   DEEN, RN, RUTHANN R - 12/27/2019 12:52 EST   Bloodless Medicine   Is Blood Transfusion Acceptable to Patient :   Yes   DEEN, RN, RUTHANN R - 12/27/2019 12:52 EST   ID Risk Screen Symptoms   Recent Travel History :   No recent travel   Close Contact with COVID-19 ID :   No   TB Symptom Screen :   No symptoms   C. diff Symptom/History ID :   Neither of the above   Sabra Heck - 12/27/2019 12:52 EST   Social History   Social History   (As Of: 12/27/2019 12:56:14 EST)   Tobacco:        Tobacco use: Former smoker, quit more than 30 days ago.   (Last Updated: 12/15/2019 09:44:58 EST by Thresa Ross,  Margaret R-RN)          Alcohol:        Current, Wine, Daily   (Last Updated: 12/15/2019 09:45:19 EST by Olam Idler R-RN)          Substance Use:        Denies   (Last Updated: 12/27/2019 12:56:00 EST by Julio Alm, RN, Durward Parcel)            Advance Directive   Advance Directive :   Yes   Type of Advance Directive :   Living will, Medical durable power of attorney   DEEN, RN, [place] Parcel - 12/27/2019 12:52 EST   Harm Screen   Feels Safe Where Live :   Yes   Last 3 mo, thoughts killing self/others :   Patient denies   Sabra Heck  - 12/27/2019 12:52 EST

## 2019-12-27 NOTE — Nursing Note (Signed)
Nursing Discharge Summary - Text       Nursing Discharge Summary Entered On:  12/27/2019 18:47 EST    Performed On:  12/27/2019 18:45 EST by Chestine Spore, RN, Baldemar Friday               DC Information   Discharge To :   Home independently   Mode of Discharge :   Wheelchair   Transportation :   Private vehicle   Accompanied By :   Leanor Rubenstein, RN, Baldemar Friday - 12/27/2019 18:47 EST

## 2019-12-27 NOTE — Nursing Note (Signed)
Nursing Discharge Summary - Text       Nursing Discharge Summary Entered On:  12/27/2019 17:57 EST    Performed On:  12/27/2019 17:15 EST by Chestine Spore, RN, Baldemar Friday               Education   Responsible Learner(s) :   No Data Available     Home Caregiver Present for Session :   Yes   Barriers To Learning :   None evident   Teaching Method :   Explanation, Printed materials   Valentino Hue - 12/27/2019 17:57 EST   Post-Hospital Education Adult Grid   Diagnostic Results :   Verbalizes understanding   Importance of Follow-Up Visits :   Verbalizes understanding   Physical Limitations :   Verbalizes understanding   Postoperative Instructions :   Verbalizes understanding   When to Call Health Care Provider :   [facility] understanding   [place], RN, Baldemar Friday - 12/27/2019 17:57 EST   Additional Learner(s) Present :   Sibling   Time Spent Educating Patient :   15 minutes   Valentino Hue - 12/27/2019 17:57 EST

## 2020-01-04 LAB — BASIC METABOLIC PANEL
Anion Gap: 14 mmol/L (ref 2–17)
BUN: 18 mg/dL (ref 8–23)
CO2: 27 mmol/L (ref 22–29)
Calcium: 10.1 mg/dL (ref 8.8–10.2)
Chloride: 99 mmol/L (ref 98–107)
Creatinine: 0.7 mg/dL (ref 0.5–1.0)
GFR African American: 102 mL/min/{1.73_m2} (ref >=90)
GFR Non-African American: 88 mL/min/{1.73_m2} — ABNORMAL LOW (ref >=90)
Glucose: 100 mg/dL — ABNORMAL HIGH (ref 70–99)
OSMOLALITY CALCULATED: 281 mOsm/kg (ref 270–287)
Potassium: 3.9 mmol/L (ref 3.5–5.3)
Sodium: 140 mmol/L (ref 135–145)

## 2020-01-31 LAB — PTT: PTT: 32.6 seconds (ref 23.3–34.5)

## 2020-01-31 LAB — COMPREHENSIVE METABOLIC PANEL
ALT: 19 U/L (ref 0–33)
AST: 21 U/L (ref 0–32)
Albumin/Globulin Ratio: 2.2 mmol/L (ref 1.00–2.70)
Albumin: 4.7 g/dL (ref 3.5–5.2)
Alk Phosphatase: 103 U/L (ref 35–117)
Anion Gap: 15 mmol/L (ref 2–17)
BUN: 15 mg/dL (ref 8–23)
CO2: 25 mmol/L (ref 22–29)
Calcium: 9.7 mg/dL (ref 8.8–10.2)
Chloride: 99 mmol/L (ref 98–107)
Creatinine: 0.6 mg/dL (ref 0.5–1.0)
GFR African American: 107 mL/min/{1.73_m2} (ref >=90)
GFR Non-African American: 92 mL/min/{1.73_m2} (ref >=90)
Globulin: 2 g/dL (ref 1.9–4.4)
Glucose: 101 mg/dL — ABNORMAL HIGH (ref 70–99)
OSMOLALITY CALCULATED: 278 mOsm/kg (ref 270–287)
Potassium: 3.8 mmol/L (ref 3.5–5.3)
Sodium: 139 mmol/L (ref 135–145)
Total Bilirubin: 1.2 mg/dL (ref 0.00–1.20)
Total Protein: 6.8 g/dL (ref 6.4–8.3)

## 2020-01-31 LAB — N TERMINAL PROBNP (AKA NTPROBNP): NT Pro-BNP: 331 pg/mL — ABNORMAL HIGH (ref 0–125)

## 2020-01-31 LAB — CBC WITH AUTO DIFFERENTIAL
Absolute Baso #: 0.1 10*3/uL (ref 0.0–0.2)
Absolute Eos #: 0.2 10*3/uL (ref 0.0–0.5)
Absolute Lymph #: 1.5 10*3/uL (ref 1.0–3.2)
Absolute Mono #: 0.6 10*3/uL (ref 0.3–1.0)
Basophils %: 0.7 % (ref 0.0–2.0)
Eosinophils %: 2.9 % (ref 0.0–7.0)
Hematocrit: 41.1 % (ref 34.0–47.0)
Hemoglobin: 14.1 g/dL (ref 11.5–15.7)
Immature Grans (Abs): 0.02 10*3/uL (ref 0.00–0.06)
Immature Granulocytes: 0.3 % (ref 0.1–0.6)
Lymphocytes: 21 % (ref 15.0–45.0)
MCH: 32.1 pg (ref 27.0–34.5)
MCHC: 34.3 g/dL (ref 32.0–36.0)
MCV: 93.6 fL (ref 81.0–99.0)
MPV: 12.3 fL (ref 7.2–13.2)
Monocytes: 8.1 % (ref 4.0–12.0)
NRBC Absolute: 0 10*3/uL (ref 0.000–0.012)
NRBC Automated: 0 % (ref 0.0–0.2)
Neutrophils %: 67 % (ref 42.0–74.0)
Neutrophils Absolute: 4.9 10*3/uL (ref 1.6–7.3)
Platelets: 236 10*3/uL (ref 140–440)
RBC: 4.39 x10e6/mcL (ref 3.60–5.20)
RDW: 13 % (ref 11.0–16.0)
WBC: 7.3 10*3/uL (ref 3.8–10.6)

## 2020-01-31 LAB — PROTIME-INR
INR: 1 — ABNORMAL LOW (ref 1.5–3.5)
Protime: 13.4 seconds (ref 11.6–14.5)

## 2020-02-01 LAB — URINALYSIS WITH REFLEX TO CULTURE
Amorphous, UA: NONE SEEN /HPF
Bilirubin Urine: NEGATIVE
Blood, Urine: NEGATIVE
Glucose, UA: NEGATIVE mg/dL
Ketones, Urine: NEGATIVE mg/dL
Leukocyte Esterase, Urine: NEGATIVE
MUCUS, URINE: NONE SEEN /LPF
Nitrite, Urine: NEGATIVE
Protein, UA: NEGATIVE
RBC, UA: NONE SEEN /HPF (ref 0–2)
Specific Gravity, UA: 1.01 (ref 1.003–1.035)
Urobilinogen, Urine: 0.2 EU/dL
pH, UA: 7.5 (ref 4.5–8.0)

## 2020-02-01 LAB — ANTIBODY SCREEN: Antibody Screen: NEGATIVE

## 2020-02-01 LAB — ABO/RH: ABO/Rh: O POS

## 2020-02-02 NOTE — Nursing Note (Signed)
Adult Admission Assessment - Text       Perioperative Admission Assessment Entered On:  02/02/2020 10:27 EST    Performed On:  02/02/2020 10:16 EST by Chandra Batch, RN, HELEN E               General   Call Complete :   02/02/2020 10:36 EST   Chandra Batch, RN, HELEN E - 02/02/2020 10:34 EST   Height/Length Estimated :   160 cm(Converted to: 62.99 in)    GRAY, RN, HEATHER D - 02/09/2020 5:59 EST     Weight   Estimated :   68.18 kg(Converted to: 150.311 lb)    Chandra Batch, RN, HELEN E - 02/02/2020 10:34 EST   Body Mass Index Estimated :   26.63 kg/m2   Wallace Cullens RN, Herbert Seta D - 02/09/2020 5:59 EST     Call Start :   02/02/2020 10:16 EST   Information Given By :   Self   PAT Patient Procedure Verification :   Patient name and DOB confirmed with patient, Correct procedure scheduled confirmed with patient, Correct side/site confirmed with patient   Primary Care Physician/Specialists :   Dr Suzie Portela // Dr Charlott Rakes // Dr Claris Gladden // Dr Marcell Barlow //      Day of Proc Supp Prsn is the Emerg Cont :   No   Day of Procedure Support Person Name :   Linton Flemings, friend, [phone]   PAT Patient/Procedure Verification :   Cindee Salt, brother, [phone]   Languages :   Alfonse Flavors, RN, HELEN E - 02/02/2020 10:16 EST   Allergies   (As Of: 02/09/2020 06:01:07 EST)   Allergies (Active)   Adhesive Bandage  Estimated Onset Date:   Unspecified ; Reactions:   Rash ; Created By:   Kyung Rudd, RN, STEPHANIE L; Reaction Status:   Active ; Category:   Drug ; Substance:   Adhesive Bandage ; Type:   Allergy ; Severity:   Unknown ; Updated By:   Kyung Rudd RN, Barkley Bruns; Reviewed Date:   02/09/2020 6:00 EST      sulfa drugs  Estimated Onset Date:   Unspecified ; Reactions:   Headache ; Created By:   Imagene Riches, RN, Unyimeabasi A; Reaction Status:   Active ; Category:   Drug ; Substance:   sulfa drugs ; Type:   Allergy ; Severity:   Severe ; Updated By:   Imagene Riches RN, Pieter Partridge; Reviewed Date:   02/09/2020 6:00 EST        Medication History   Medication List   (As Of: 02/09/2020  06:02:54 EST)   Normal Order    Sodium Chloride 0.9% 1,000 mL  :   Sodium Chloride 0.9% 1,000 mL ; Status:   Ordered ; Ordered As Mnemonic:   Sodium Chloride 0.9% 1,000 mL ; Simple Display Line:   30 mL/hr, IV, Stop: 02/09/20 9:50:00 EST ; Ordering Provider:   Aggie Cosier,  TROY-MD; Catalog Code:   Sodium Chloride 0.9% ; Order Dt/Tm:   02/09/2020 05:51:01 EST ; Comment:   Perioperative use ONLY          aspirin  :   aspirin ; Status:   Ordered ; Ordered As Mnemonic:   aspirin ; Simple Display Line:   162 mg, Oral, Once ; Ordering Provider:   Aggie Cosier,  TROY-MD; Catalog Code:   aspirin ; Order Dt/Tm:   02/09/2020 05:51:01 EST ; Comment:   Hold if patient took dose at home the morning of surgery.  ceFAZolin  :   ceFAZolin ; Status:   Ordered ; Ordered As Mnemonic:   ceFAZolin IVPB ; Simple Display Line:   2 g, IV Piggyback, On Call ; Ordering Provider:   Aggie Cosier,  TROY-MD; Catalog Code:   ceFAZolin ; Order Dt/Tm:   02/09/2020 05:51:03 EST          chlorhexidine topical  :   chlorhexidine topical ; Status:   Ordered ; Ordered As Mnemonic:   chlorhexidine 0.12% mucous membrane liquid ; Simple Display Line:   15 mL, Oral, On Call ; Ordering Provider:   Aggie Cosier,  TROY-MD; Catalog Code:   chlorhexidine topical ; Order Dt/Tm:   02/09/2020 05:51:01 EST ; Comment:   Swish and expectorate          mupirocin topical  :   mupirocin topical ; Status:   Ordered ; Ordered As Mnemonic:   mupirocin topical ; Simple Display Line:   1 app, Nasal, On Call ; Ordering Provider:   Aggie Cosier,  TROY-MD; Catalog Code:   mupirocin topical ; Order Dt/Tm:   02/09/2020 05:51:01 EST          vancomycin  :   vancomycin ; Status:   Ordered ; Ordered As Mnemonic:   vancomycin IVPB ; Simple Display Line:   1.5 g, IV Piggyback, On Call ; Ordering Provider:   Aggie Cosier,  TROY-MD; Catalog Code:   vancomycin ; Order Dt/Tm:   02/09/2020 05:51:03 EST ; Comment:   wt 65-100kg/143-220lbs            Home Meds    fluticasone nasal  :   fluticasone nasal ; Status:    Documented ; Ordered As Mnemonic:   fluticasone 27.5 mcg/inh nasal spray ; Simple Display Line:   2 sprays, Nasal, Daily, PRN, 10 g, 0 Refill(s) ; Catalog Code:   fluticasone nasal ; Order Dt/Tm:   12/15/2019 09:20:09 EST          cholecalciferol  :   cholecalciferol ; Status:   Documented ; Ordered As Mnemonic:   Vitamin D3 2000 intl units oral capsule ; Simple Display Line:   50 mcg, 1 caps, Oral, Daily, 0 Refill(s) ; Catalog Code:   cholecalciferol ; Order Dt/Tm:   12/15/2019 09:19:49 EST          pantoprazole  :   pantoprazole ; Status:   Documented ; Ordered As Mnemonic:   pantoprazole 40 mg oral delayed release tablet ; Simple Display Line:   40 mg, 1 tabs, Oral, qAM, 0 Refill(s) ; Catalog Code:   pantoprazole ; Order Dt/Tm:   12/15/2019 09:19:38 EST          rosuvastatin  :   rosuvastatin ; Status:   Documented ; Ordered As Mnemonic:   rosuvastatin 5 mg oral tablet ; Simple Display Line:   5 mg, 1 tabs, Oral, qAM, 0 Refill(s) ; Catalog Code:   rosuvastatin ; Order Dt/Tm:   12/15/2019 09:19:19 EST          losartan-hydrochlorothiazide  :   losartan-hydrochlorothiazide ; Status:   Documented ; Ordered As Mnemonic:   losartan-hydrochlorothiazide 100 mg-25 mg oral tablet ; Simple Display Line:   1 tabs, Oral, qAM, 30 tabs, 0 Refill(s) ; Catalog Code:   losartan-hydrochlorothiazide ; Order Dt/Tm:   12/15/2019 09:18:22 EST          spironolactone  :   spironolactone ; Status:   Documented ; Ordered As Mnemonic:   spironolactone 25 mg oral tablet ; Simple  Display Line:   12.5 mg, 0.5 tabs, Oral, Every other day, 0 Refill(s) ; Catalog Code:   spironolactone ; Order Dt/Tm:   12/15/2019 09:18:37 EST          amLODIPine  :   amLODIPine ; Status:   Documented ; Ordered As Mnemonic:   amLODIPine 5 mg oral tablet ; Simple Display Line:   5 mg, 1 tabs, Oral, qAM, 0 Refill(s) ; Catalog Code:   amLODIPine ; Order Dt/Tm:   12/15/2019 09:17:55 EST          metoprolol  :   metoprolol ; Status:   Documented ; Ordered As Mnemonic:    metoprolol succinate 25 mg oral tablet, extended release ; Simple Display Line:   25 mg, 1 tabs, Oral, qAM, 0 Refill(s) ; Catalog Code:   metoprolol ; Order Dt/Tm:   12/15/2019 09:17:27 EST            Problem History   (As Of: 02/09/2020 06:02:54 EST)   Problems(Active)    Aortic valve disease (SNOMED CT  :89381017 )  Name of Problem:   Aortic valve disease ; Recorder:   Olam Idler R-RN; Confirmation:   Confirmed ; Classification:   Patient Stated ; Code:   [id number] ; Contributor System:   PowerChart ; Last Updated:   12/15/2019 9:55 EST ; Life Cycle Date:   12/15/2019 ; Life Cycle Status:   Active ; Vocabulary:   SNOMED CT        AVM (arteriovenous malformation) (SNOMED CT  :77824235 )  Name of Problem:   AVM (arteriovenous malformation) ; Recorder:   DEEN, RN, RUTHANN R; Confirmation:   Confirmed ; Classification:   Patient Stated ; Code:   [id number] ; Contributor System:   Dietitian ; Last Updated:   12/27/2019 12:54 EST ; Life Cycle Date:   12/27/2019 ; Life Cycle Status:   Active ; Vocabulary:   SNOMED CT        GI bleed (SNOMED CT  :400867619 )  Name of Problem:   GI bleed ; Recorder:   Olam Idler R-RN; Confirmation:   Confirmed ; Classification:   Patient Stated ; Code:   [id number] ; Contributor System:   Dietitian ; Last Updated:   12/15/2019 9:55 EST ; Life Cycle Date:   12/15/2019 ; Life Cycle Status:   Active ; Vocabulary:   SNOMED CT        Hypercholesterolemia (SNOMED CT  :45809983 )  Name of Problem:   Hypercholesterolemia ; Recorder:   Olam Idler R-RN; Confirmation:   Confirmed ; Classification:   Patient Stated ; Code:   38250539 ; Contributor System:   Dietitian ; Last Updated:   12/15/2019 9:55 EST ; Life Cycle Date:   12/15/2019 ; Life Cycle Status:   Active ; Vocabulary:   SNOMED CT        Hypertension (SNOMED CT  :7673419379 )  Name of Problem:   Hypertension ; Recorder:   Olam Idler R-RN; Confirmation:   Confirmed ; Classification:   Patient Stated ; Code:    [id number] ; Contributor System:   Dietitian ; Last Updated:   12/15/2019 9:54 EST ; Life Cycle Date:   12/15/2019 ; Life Cycle Status:   Active ; Vocabulary:   SNOMED CT        Melanoma (SNOMED CT  :9924268341 )  Name of Problem:   Melanoma ; Recorder:   DEEN, RN, RUTHANN R; Confirmation:   Confirmed ; Classification:  Patient Stated ; Code:   5409811914 ; Contributor System:   PowerChart ; Last Updated:   12/27/2019 12:54 EST ; Life Cycle Date:   12/27/2019 ; Life Cycle Status:   Active ; Vocabulary:   SNOMED CT        Spinal stenosis, lumbar (SNOMED CT  :78295621 )  Name of Problem:   Spinal stenosis, lumbar ; Recorder:   FISCHER, RN, HELEN E; Confirmation:   Confirmed ; Classification:   Patient Stated ; Code:   30865784 ; Contributor System:   PowerChart ; Last Updated:   02/02/2020 10:24 EST ; Life Cycle Date:   02/02/2020 ; Life Cycle Status:   Active ; Vocabulary:   SNOMED CT          Diagnoses(Active)    Encounter for screening for other viral diseases  Date:   02/02/2020 ; Confirmation:   Confirmed ; Clinical Dx:   Encounter for screening for other viral diseases ; Classification:   Medical ; Clinical Service:   Non-Specified ; Code:   ICD-10-CM ; Probability:   0 ; Diagnosis Code:   Z11.59        Procedure History        -    Procedure History   (As Of: 02/09/2020 06:02:54 EST)     Procedure Dt/Tm:   12/27/2019 ; Anesthesia Minutes:   0 ; Procedure Name:   Cardiac catheterization ; Procedure Minutes:   0 ; Last Reviewed Dt/Tm:   02/09/2020 06:02:11 EST            Anesthesia Minutes:   0 ; Procedure Name:   Appendectomy ; Procedure Minutes:   0 ; Last Reviewed Dt/Tm:   02/09/2020 06:02:11 EST            Procedure Dt/Tm:   07/05/2009 ; Anesthesia Minutes:   0 ; Procedure Name:   Aortic Valve Replacement ; Procedure Minutes:   0 ; Last Reviewed Dt/Tm:   02/09/2020 06:02:11 EST            Anesthesia Minutes:   0 ; Procedure Name:   Hysterectomy ; Procedure Minutes:   0 ; Last Reviewed Dt/Tm:   02/09/2020 06:02:11  EST            History Confirmation   Problem History Changes PAT :   No   GRAY, RN, HEATHER D - 02/09/2020 6:01 EST   Anesthesia/Sedation   Symptoms of Sleep Apnea :   Age greater than 76, Hypertension   Symptoms of Sleep Apnea Score (STOP BANG) :   2    Shortness of Breath Indicator :   No shortness of breath   Pregnancy Status :   N/A   FISCHER, RN, HELEN E - 02/02/2020 10:34 EST   Anesthesia History :   Prior general anesthesia   SN - Malignant Hyperthermia :   Denies   Previous Problem with Anesthesia :   None   Moderate Sedation History :   Prior sedation for procedure   Previous Problem With Sedation :   None   FISCHER, RN, HELEN E - 02/02/2020 10:16 EST   Bloodless Medicine   Is Blood Transfusion Acceptable to Patient :   Yes   FISCHER, RN, HELEN E - 02/02/2020 10:16 EST   ID Risk Screen Symptoms   Recent Travel History :   No recent travel   Close Contact with COVID-19 ID :   Preadmission testing patients only   Alm Bustard, RN, HELEN E - 02/02/2020 10:16  EST   Last 14 days COVID-19 ID :   Yes - Not Detected (negative)   GRAY, RN, HEATHER D - 02/09/2020 6:01 EST     TB Symptom Screen :   No symptoms   C. diff Symptom/History ID :   Neither of the above   Bethany, RN, HELEN E - 02/02/2020 10:16 EST   ID COVID-19 Screen   Fever OR Chills :   No   Headache :   No   New or Worsening Cough :   No   Fatigue :   No   Shortness of Breath ID :   No   Myalgia (Muscle Pain) :   No   Dyspnea :   No   Diarrhea :   No   Sore Throat :   No   Nausea :   No   Laryngitis :   No   Sudden Loss of Taste or Smell :   No   Chandra Batch, RN, HELEN E - 02/02/2020 10:16 EST   Social History   Social History   (As Of: 02/09/2020 06:02:54 EST)   Tobacco:        Tobacco use: Former smoker, quit more than 30 days ago.   (Last Updated: 12/15/2019 09:44:58 EST by Olam Idler R-RN)          Electronic Cigarette/Vaping:        Never Electronic Cigarette Use.   (Last Updated: 02/02/2020 10:27:15 EST by Chandra Batch, RN, HELEN E)          Alcohol:        Current,  Wine, Daily   (Last Updated: 12/15/2019 09:45:19 EST by Olam Idler R-RN)          Substance Use:        Opioid Naive - not currently taking opioids, Denies   (Last Updated: 02/02/2020 10:27:22 EST by Chandra Batch, RN, HELEN E)            Advance Directive   Location of Advance Directive :   Copy placed on paper chart   GRAY, RN, HEATHER D - 02/09/2020 6:01 EST   Advance Directive :   Yes   Type of Advance Directive :   Living will, Medical durable power of attorney   Chandra Batch, RN, HELEN E - 02/02/2020 10:16 EST   Harm Screen   Suspect or Concern for: :   None   Feels Safe Where Live :   Yes   Last 3 mo, thoughts killing self/others :   Patient denies   Wallace Cullens RN, Herbert Seta D - 02/09/2020 6:01 EST

## 2020-02-06 LAB — COVID-19 (COBAS): SARS-CoV-2, NAA: NEGATIVE

## 2020-02-09 LAB — CBC
Hematocrit: 40 % (ref 34.0–47.0)
Hemoglobin: 13.2 g/dL (ref 11.5–15.7)
MCH: 32.6 pg (ref 27.0–34.5)
MCHC: 33 g/dL (ref 32.0–36.0)
MCV: 98.8 fL (ref 81.0–99.0)
MPV: 11.4 fL (ref 7.2–13.2)
NRBC Absolute: 0 10*3/uL (ref 0.000–0.012)
NRBC Automated: 0 % (ref 0.0–0.2)
Platelets: 172 10*3/uL (ref 140–440)
RBC: 4.05 x10e6/mcL (ref 3.60–5.20)
RDW: 12.9 % (ref 11.0–16.0)
WBC: 7.3 10*3/uL (ref 3.8–10.6)

## 2020-02-09 LAB — PROTIME-INR
INR: 1.1 — ABNORMAL LOW (ref 1.5–3.5)
Protime: 14.2 seconds (ref 11.6–14.5)

## 2020-02-09 LAB — ANTIBODY SCREEN: Antibody Screen: NEGATIVE

## 2020-02-09 LAB — POCT GLUCOSE: POC Glucose: 110 mg/dL (ref 70.0–120.0)

## 2020-02-09 LAB — POCT ACTIVATED CLOTTING TIME: Coagulation time, activated, POC: 315 seconds — ABNORMAL HIGH (ref 100–141)

## 2020-02-09 LAB — ABO/RH: ABO/Rh: O POS

## 2020-02-09 NOTE — Consults (Signed)
Outpatient Cardiac Rehab Consult       Cardiac Rehab Consult Entered On:  02/09/2020 14:27 EST    Performed On:  02/09/2020 14:27 EST by Reesa Chew               Cardiac Rehab Consult   Patient Procedure/Surgery :   TAVR   Cardiac Rehab Qualification :   Patient Qualifies for Phase II Cardiac Rehab Program   Cardiac Rehab Consult Review :   Cardiac Rehab staff unable to visit patient prior to discharge.   Cardiac Rehab Follow-up :   Staff will follow up with patient at home via phone call   Reesa Chew - 02/09/2020 14:27 EST    Signature Line     Electronically Signed on 02/09/2020 02:27 PM EST   ________________________________________________   Reesa Chew

## 2020-02-09 NOTE — Nursing Note (Signed)
Adult Patient History Form-Text       Adult Patient History Entered On:  02/09/2020 10:52 EST    Performed On:  02/09/2020 10:50 EST by Dareen Piano, RN, KELLY A               General Info   Patient Identified :   Identification band, Verbal   Patient Identified :   Jodi Powell   Information Given By :   Self   Preferred Mode of Communication :   Verbal   Accompanied By :   None   Pregnancy Status :   N/A   Has the patient received chemotherapy or immunotherapy (cytotoxic)  in the last 48-72 hours? :   No   In Clinical Trial With Signed Consent for Related Condition :   No signed consent for clinical trial   Is the patient currently (2-3 days) receiving radiation treatment? :   No   Dareen Piano, RN, KELLY A - 02/09/2020 10:50 EST   Allergies   (As Of: 02/09/2020 10:52:36 EST)   Allergies (Active)   Adhesive Bandage  Estimated Onset Date:   Unspecified ; Reactions:   Rash ; Created By:   Kyung Rudd, RN, STEPHANIE L; Reaction Status:   Active ; Category:   Drug ; Substance:   Adhesive Bandage ; Type:   Allergy ; Severity:   Unknown ; Updated By:   Kyung Rudd RN, Barkley Bruns; Reviewed Date:   02/09/2020 10:50 EST      sulfa drugs  Estimated Onset Date:   Unspecified ; Reactions:   Headache ; Created By:   Imagene Riches, RN, Unyimeabasi A; Reaction Status:   Active ; Category:   Drug ; Substance:   sulfa drugs ; Type:   Allergy ; Severity:   Severe ; Updated By:   Imagene Riches RN, Pieter Partridge; Reviewed Date:   02/09/2020 10:50 EST        Medication History   Medication List   (As Of: 02/09/2020 10:52:36 EST)   Normal Order    Sodium Chloride 0.9% intravenous solution 1,000 mL  :   Sodium Chloride 0.9% intravenous solution 1,000 mL ; Status:   Ordered ; Ordered As Mnemonic:   Sodium Chloride 0.9% 1,000 mL ; Simple Display Line:   75 mL/hr, IV ; Ordering Provider:   Aggie Cosier,  TROY-MD; Catalog Code:   Sodium Chloride 0.9% ; Order Dt/Tm:   02/09/2020 09:04:06 EST          Sodium Chloride 0.9% intravenous solution 1,000 mL  :   Sodium Chloride 0.9% intravenous  solution 1,000 mL ; Status:   Completed ; Ordered As Mnemonic:   Sodium Chloride 0.9% 1,000 mL ; Simple Display Line:   30 mL/hr, IV, Stop: 02/09/20 9:50:00 EST ; Ordering Provider:   Aggie Cosier,  TROY-MD; Catalog Code:   Sodium Chloride 0.9% ; Order Dt/Tm:   02/09/2020 05:51:01 EST ; Comment:   Perioperative use ONLY          amLODIPine 5 mg Tab  :   amLODIPine 5 mg Tab ; Status:   Ordered ; Ordered As Mnemonic:   amLODIPine ; Simple Display Line:   5 mg, 1 tabs, Oral, qAM ; Ordering Provider:   Aggie Cosier,  TROY-MD; Catalog Code:   amLODIPine ; Order Dt/Tm:   02/09/2020 09:04:35 EST          aspirin 81 mg DR Tab  :   aspirin 81 mg DR Tab ; Status:   Ordered ; Ordered As Mnemonic:   aspirin ;  Simple Display Line:   81 mg, 1 tabs, Oral, Daily ; Ordering Provider:   Aggie Cosier,  TROY-MD; Catalog Code:   aspirin ; Order Dt/Tm:   02/09/2020 09:04:07 EST          atorvastatin 10 mg Tab  :   atorvastatin 10 mg Tab ; Status:   Ordered ; Ordered As Mnemonic:   atorvastatin ; Simple Display Line:   10 mg, 1 tabs, Oral, Daily ; Ordering Provider:   Aggie Cosier,  TROY-MD; Catalog Code:   atorvastatin ; Order Dt/Tm:   02/09/2020 09:05:03 EST          cholecalciferol 1,000 units (equivalent to 25 mcg)Tab  :   cholecalciferol 1,000 units (equivalent to 25 mcg)Tab ; Status:   Ordered ; Ordered As Mnemonic:   Vitamin D3 1000 intl units oral tablet ; Simple Display Line:   2,000 units, 2 tabs, Oral, Daily ; Ordering Provider:   Aggie Cosier,  TROY-MD; Catalog Code:   cholecalciferol ; Order Dt/Tm:   02/09/2020 09:04:37 EST          hydroCHLOROthiazide 25 mg Tab  :   hydroCHLOROthiazide 25 mg Tab ; Status:   Ordered ; Ordered As Mnemonic:   hydrochlorothiazide ; Simple Display Line:   25 mg, 1 tabs, Oral, qAM ; Ordering Provider:   Aggie Cosier,  TROY-MD; Catalog Code:   hydrochlorothiazide ; Order Dt/Tm:   02/09/2020 09:27:21 EST          losartan 50 mg Tab  :   losartan 50 mg Tab ; Status:   Ordered ; Ordered As Mnemonic:   losartan ; Simple Display Line:    100 mg, 2 tabs, Oral, qAM ; Ordering Provider:   Aggie Cosier,  TROY-MD; Catalog Code:   losartan ; Order Dt/Tm:   02/09/2020 09:27:20 EST          losartan-hydrochlorothiazide  :   losartan-hydrochlorothiazide ; Status:   Voided ; Ordered As Mnemonic:   losartan-hydrochlorothiazide 100 mg-25 mg oral tablet ; Simple Display Line:   1 tabs, Oral, qAM ; Ordering Provider:   Aggie Cosier,  TROY-MD; Catalog Code:   losartan-hydrochlorothiazide ; Order Dt/Tm:   02/09/2020 09:04:38 EST          metoprolol succinate 25 mg ER Tab  :   metoprolol succinate 25 mg ER Tab ; Status:   Ordered ; Ordered As Mnemonic:   metoprolol succinate 25 mg oral tablet, extended release ; Simple Display Line:   25 mg, 1 tabs, Oral, qAM ; Ordering Provider:   Aggie Cosier,  TROY-MD; Catalog Code:   metoprolol ; Order Dt/Tm:   02/09/2020 09:04:55 EST          pantoprazole 40 mg DR Tab  :   pantoprazole 40 mg DR Tab ; Status:   Ordered ; Ordered As Mnemonic:   pantoprazole ; Simple Display Line:   40 mg, 1 tabs, Oral, Daily ; Ordering Provider:   Aggie Cosier,  TROY-MD; Catalog Code:   pantoprazole ; Order Dt/Tm:   02/09/2020 09:04:06 EST          spironolactone 25 mg Tab  :   spironolactone 25 mg Tab ; Status:   Ordered ; Ordered As Mnemonic:   spironolactone ; Simple Display Line:   12.5 mg, 0.5 tabs, Oral, Every other day ; Ordering Provider:   Aggie Cosier,  TROY-MD; Catalog Code:   spironolactone ; Order Dt/Tm:   02/09/2020 09:05:04 EST          mupirocin 2% Topical Oint 22 gm  :  mupirocin 2% Topical Oint 22 gm ; Status:   Ordered ; Ordered As Mnemonic:   mupirocin 2% topical ointment ; Simple Display Line:   1 app, Topical, BID ; Ordering Provider:   Aggie Cosier,  TROY-MD; Catalog Code:   mupirocin topical ; Order Dt/Tm:   02/09/2020 09:04:06 EST          Vancomycin 1500mg /250 ml NS  :   Vancomycin 1500mg /250 ml NS ; Status:   Ordered ; Ordered As Mnemonic:   vancomycin ; Simple Display Line:   1,500 mg, 250 mL, 166.67 mL/hr, IV Piggyback, Once ; Ordering Provider:    ,  TROY-MD; Catalog Code:   vancomycin ; Order Dt/Tm:   02/09/2020 10:23:12 EST ; Comment:   x 1 dose  located in Rx delivery bin          warfarin 5 mg Tab  :   warfarin 5 mg Tab ; Status:   Ordered ; Ordered As Mnemonic:   Coumadin ; Simple Display Line:   5 mg, 1 tabs, Oral, Daily ; Ordering Provider:   Aggie Cosier,  TROY-MD; Catalog Code:   warfarin ; Order Dt/Tm:   02/09/2020 09:05:52 EST ; Comment:   New Start          omeprazole 20 mg DR Cap  :   omeprazole 20 mg DR Cap ; Status:   Voided ; Ordered As Mnemonic:   omeprazole ; Simple Display Line:   20 mg, 1 caps, Oral, BID ; Ordering Provider:   Aggie Cosier,  TROY-MD; Catalog Code:   omeprazole ; Order Dt/Tm:   02/09/2020 09:04:58 EST          ceFAZolin 2 g/100 ml NS  :   ceFAZolin 2 g/100 ml NS ; Status:   Ordered ; Ordered As Mnemonic:   ceFAZolin ; Simple Display Line:   2 g, 100 mL, 200 mL/hr, IV Piggyback, q8hr-INT ; Ordering Provider:   Aggie Cosier,  TROY-MD; Catalog Code:   ceFAZolin ; Order Dt/Tm:   02/09/2020 10:21:17 EST ; Comment:   x 2 doses  located in Rx delivery bin          fluticasone 50 mcg/inh Nasal Spray 16 gm  :   fluticasone 50 mcg/inh Nasal Spray 16 gm ; Status:   Ordered ; Ordered As Mnemonic:   fluticasone 50 mcg/inh nasal spray ; Simple Display Line:   50 mcg, 1 sprays, Nasal, Daily, PRN: allergic rhinitis/nasal congestion ; Ordering Provider:   Aggie Cosier,  TROY-MD; Catalog Code:   fluticasone nasal ; Order Dt/Tm:   02/09/2020 10:10:50 EST          acetaminophen 650 mg/20.3 mL Oral Liquid  :   acetaminophen 650 mg/20.3 mL Oral Liquid ; Status:   Ordered ; Ordered As Mnemonic:   acetaminophen ; Simple Display Line:   650 mg, 20.3 mL, Oral, q4hr, PRN: mild pain (1-3) ; Ordering Provider:   Aggie Cosier,  TROY-MD; Catalog Code:   acetaminophen ; Order Dt/Tm:   02/09/2020 09:04:07 EST          fluticasone nasal  :   fluticasone nasal ; Status:   Voided ; Ordered As Mnemonic:   fluticasone 27.5 mcg/inh nasal spray ; Simple Display Line:   2 sprays, Nasal,  Daily, PRN: allergic rhinitis/nasal congestion ; Ordering Provider:   Aggie Cosier,  TROY-MD; Catalog Code:   fluticasone nasal ; Order Dt/Tm:   02/09/2020 09:04:38 EST          ondansetron 2 mg/mL Inj Soln 2  mL  :   ondansetron 2 mg/mL Inj Soln 2 mL ; Status:   Ordered ; Ordered As Mnemonic:   ondansetron ; Simple Display Line:   4 mg, 2 mL, IV Push, q6hr, PRN: nausea/vomiting ; Ordering Provider:   Aggie Cosier,  TROY-MD; Catalog Code:   ondansetron ; Order Dt/Tm:   02/09/2020 09:04:07 EST          glycerin 35% (prediluted) mouth spray  :   glycerin 35% (prediluted) mouth spray ; Status:   Ordered ; Ordered As Mnemonic:   saliva substitutes ; Simple Display Line:   1 sprays, Oral, QID, PRN: other (see comment) ; Ordering Provider:   Aggie Cosier,  TROY-MD; Catalog Code:   saliva substitutes ; Order Dt/Tm:   02/09/2020 09:04:06 EST ; Comment:   dry mouth, keep at bedside          zolpidem 5 mg Tab  :   zolpidem 5 mg Tab ; Status:   Ordered ; Ordered As Mnemonic:   zolpidem ; Simple Display Line:   5 mg, 1 tabs, Oral, Once a Day (at bedtime), PRN: insomnia ; Ordering Provider:   Aggie Cosier,  TROY-MD; Catalog Code:   zolpidem ; Order Dt/Tm:   02/09/2020 09:04:07 EST          iopamidol 61% Inj Soln 100 mL  :   iopamidol 61% Inj Soln 100 mL ; Status:   Completed ; Ordered As Mnemonic:   Isovue-300 ; Simple Display Line:   200 mL, Intra-arterial, Once ; Ordering Provider:   Aggie Cosier,  TROY-MD; Catalog Code:   iopamidol ; Order Dt/Tm:   02/09/2020 06:14:53 EST          aspirin 81 mg Chew Tab  :   aspirin 81 mg Chew Tab ; Status:   Completed ; Ordered As Mnemonic:   aspirin ; Simple Display Line:   162 mg, 2 tabs, Oral, Once ; Ordering Provider:   Aggie Cosier,  TROY-MD; Catalog Code:   aspirin ; Order Dt/Tm:   02/09/2020 05:51:01 EST ; Comment:   Hold if patient took dose at home the morning of surgery.          ceFAZolin duplex  :   ceFAZolin duplex ; Status:   Completed ; Ordered As Mnemonic:   ceFAZolin IVPB ; Simple Display Line:   2 g, 50 mL,  100 mL/hr, IV Piggyback, On Call ; Ordering Provider:   Aggie Cosier,  TROY-MD; Catalog Code:   ceFAZolin ; Order Dt/Tm:   02/09/2020 05:51:03 EST          chlorhexidine 0.12% Mucous Membrane Liquid  :   chlorhexidine 0.12% Mucous Membrane Liquid ; Status:   Completed ; Ordered As Mnemonic:   chlorhexidine 0.12% mucous membrane liquid ; Simple Display Line:   15 mL, Oral, On Call ; Ordering Provider:   Aggie Cosier,  TROY-MD; Catalog Code:   chlorhexidine topical ; Order Dt/Tm:   02/09/2020 05:51:01 EST ; Comment:   Swish and expectorate          mupirocin 2% Topical Oint 22 gm  :   mupirocin 2% Topical Oint 22 gm ; Status:   Completed ; Ordered As Mnemonic:   mupirocin topical ; Simple Display Line:   1 app, Nasal, On Call ; Ordering Provider:   Aggie Cosier,  TROY-MD; Catalog Code:   mupirocin topical ; Order Dt/Tm:   02/09/2020 05:51:01 EST          Vancomycin 1500mg /250 ml NS  :   Vancomycin  1500mg /250 ml NS ; Status:   Completed ; Ordered As Mnemonic:   vancomycin IVPB ; Simple Display Line:   1.5 g, 250 mL, 166.67 mL/hr, IV Piggyback, On Call ; Ordering Provider:   Aggie CosierBUNTING,  TROY-MD; Catalog Code:   vancomycin ; Order Dt/Tm:   02/09/2020 05:51:03 EST ; Comment:   wt 65-100kg/143-220lbs            Home Meds    fluticasone nasal  :   fluticasone nasal ; Status:   Documented ; Ordered As Mnemonic:   fluticasone 27.5 mcg/inh nasal spray ; Simple Display Line:   2 sprays, Nasal, Daily, PRN, 10 g, 0 Refill(s) ; Catalog Code:   fluticasone nasal ; Order Dt/Tm:   12/15/2019 09:20:09 EST          cholecalciferol  :   cholecalciferol ; Status:   Documented ; Ordered As Mnemonic:   Vitamin D3 2000 intl units oral capsule ; Simple Display Line:   50 mcg, 1 caps, Oral, Daily, 0 Refill(s) ; Catalog Code:   cholecalciferol ; Order Dt/Tm:   12/15/2019 09:19:49 EST          pantoprazole  :   pantoprazole ; Status:   Documented ; Ordered As Mnemonic:   pantoprazole 40 mg oral delayed release tablet ; Simple Display Line:   40 mg, 1 tabs, Oral,  qAM, 0 Refill(s) ; Catalog Code:   pantoprazole ; Order Dt/Tm:   12/15/2019 09:19:38 EST          rosuvastatin  :   rosuvastatin ; Status:   Documented ; Ordered As Mnemonic:   rosuvastatin 5 mg oral tablet ; Simple Display Line:   5 mg, 1 tabs, Oral, qAM, 0 Refill(s) ; Catalog Code:   rosuvastatin ; Order Dt/Tm:   12/15/2019 09:19:19 EST          losartan-hydrochlorothiazide  :   losartan-hydrochlorothiazide ; Status:   Documented ; Ordered As Mnemonic:   losartan-hydrochlorothiazide 100 mg-25 mg oral tablet ; Simple Display Line:   1 tabs, Oral, qAM, 30 tabs, 0 Refill(s) ; Catalog Code:   losartan-hydrochlorothiazide ; Order Dt/Tm:   12/15/2019 09:18:22 EST          spironolactone  :   spironolactone ; Status:   Documented ; Ordered As Mnemonic:   spironolactone 25 mg oral tablet ; Simple Display Line:   12.5 mg, 0.5 tabs, Oral, Every other day, 0 Refill(s) ; Catalog Code:   spironolactone ; Order Dt/Tm:   12/15/2019 09:18:37 EST          amLODIPine  :   amLODIPine ; Status:   Documented ; Ordered As Mnemonic:   amLODIPine 5 mg oral tablet ; Simple Display Line:   5 mg, 1 tabs, Oral, qAM, 0 Refill(s) ; Catalog Code:   amLODIPine ; Order Dt/Tm:   12/15/2019 09:17:55 EST          metoprolol  :   metoprolol ; Status:   Documented ; Ordered As Mnemonic:   metoprolol succinate 25 mg oral tablet, extended release ; Simple Display Line:   25 mg, 1 tabs, Oral, qAM, 0 Refill(s) ; Catalog Code:   metoprolol ; Order Dt/Tm:   12/15/2019 09:17:27 EST            Problem History   (As Of: 02/09/2020 10:52:36 EST)   Problems(Active)    Aortic valve disease (SNOMED CT  :1610960415387019 )  Name of Problem:   Aortic valve disease ; Recorder:  Olam Idler R-RN; Confirmation:   Confirmed ; Classification:   Patient Stated ; Code:   [id number] ; Contributor System:   PowerChart ; Last Updated:   12/15/2019 9:55 EST ; Life Cycle Date:   12/15/2019 ; Life Cycle Status:   Active ; Vocabulary:   SNOMED CT        AVM (arteriovenous  malformation) (SNOMED CT  :81829937 )  Name of Problem:   AVM (arteriovenous malformation) ; Recorder:   DEEN, RN, RUTHANN R; Confirmation:   Confirmed ; Classification:   Patient Stated ; Code:   [id number] ; Contributor System:   Dietitian ; Last Updated:   12/27/2019 12:54 EST ; Life Cycle Date:   12/27/2019 ; Life Cycle Status:   Active ; Vocabulary:   SNOMED CT        GI bleed (SNOMED CT  :810175102 )  Name of Problem:   GI bleed ; Recorder:   Olam Idler R-RN; Confirmation:   Confirmed ; Classification:   Patient Stated ; Code:   [id number] ; Contributor System:   Dietitian ; Last Updated:   12/15/2019 9:55 EST ; Life Cycle Date:   12/15/2019 ; Life Cycle Status:   Active ; Vocabulary:   SNOMED CT        Hypercholesterolemia (SNOMED CT  :23536144 )  Name of Problem:   Hypercholesterolemia ; Recorder:   Olam Idler R-RN; Confirmation:   Confirmed ; Classification:   Patient Stated ; Code:   [id number] ; Contributor System:   Dietitian ; Last Updated:   12/15/2019 9:55 EST ; Life Cycle Date:   12/15/2019 ; Life Cycle Status:   Active ; Vocabulary:   SNOMED CT        Hypertension (SNOMED CT  :7619509326 )  Name of Problem:   Hypertension ; Recorder:   Olam Idler R-RN; Confirmation:   Confirmed ; Classification:   Patient Stated ; Code:   [id number] ; Contributor System:   Dietitian ; Last Updated:   12/15/2019 9:54 EST ; Life Cycle Date:   12/15/2019 ; Life Cycle Status:   Active ; Vocabulary:   SNOMED CT        Melanoma (SNOMED CT  :3382505397 )  Name of Problem:   Melanoma ; Recorder:   DEEN, RN, RUTHANN R; Confirmation:   Confirmed ; Classification:   Patient Stated ; Code:   [id number] ; Contributor System:   PowerChart ; Last Updated:   12/27/2019 12:54 EST ; Life Cycle Date:   12/27/2019 ; Life Cycle Status:   Active ; Vocabulary:   SNOMED CT        Spinal stenosis, lumbar (SNOMED CT  :24097353 )  Name of Problem:   Spinal stenosis, lumbar ; Recorder:   FISCHER, RN, HELEN E;  Confirmation:   Confirmed ; Classification:   Patient Stated ; Code:   [id number] ; Contributor System:   PowerChart ; Last Updated:   02/02/2020 10:24 EST ; Life Cycle Date:   02/02/2020 ; Life Cycle Status:   Active ; Vocabulary:   SNOMED CT          Diagnoses(Active)    Encounter for screening for other viral diseases  Date:   02/02/2020 ; Confirmation:   Confirmed ; Clinical Dx:   Encounter for screening for other viral diseases ; Classification:   Medical ; Clinical Service:   Non-Specified ; Code:   ICD-10-CM ; Probability:   0 ; Diagnosis Code:   Z11.59  Procedure History        -    Procedure History   (As Of: 02/09/2020 10:52:36 EST)     Procedure Dt/Tm:   12/27/2019 ; Anesthesia Minutes:   0 ; Procedure Name:   Cardiac catheterization ; Procedure Minutes:   0 ; Last Reviewed Dt/Tm:   02/09/2020 10:51:08 EST            Anesthesia Minutes:   0 ; Procedure Name:   Appendectomy ; Procedure Minutes:   0 ; Last Reviewed Dt/Tm:   02/09/2020 10:51:08 EST            Procedure Dt/Tm:   02/09/2020 07:53:00 EST ; Location:   [facility] ; Provider:   Tenny CrawOSS,  SCOTT-MD; Anesthesia Type:   Monitored Anesthesia Care ; Anesthesia Minutes:   0 ; Procedure Name:   Aortic Valve Replacement Transfemoral Balloon Expanded ; Procedure Minutes:   50 ; Comments:     02/09/2020 8:47 EST - Denna HaggardArno, RN, Ricci BarkerMolly L  auto-populated from documented surgical case ; Clinical Service:   Surgery ; Last Reviewed Dt/Tm:   02/09/2020 10:51:08 EST            Procedure Dt/Tm:   02/09/2020 07:53:00 EST ; Location:   [facility] ; Provider:   Aggie CosierBUNTING,  TROY-MD; Anesthesia Type:   Other ; Anesthesia Minutes:   0 ; Procedure Name:   +TAVR Cardiologist ; Procedure Minutes:   50 ; Comments:     02/09/2020 8:47 EST - Denna HaggardArno, RN, Ricci BarkerMolly L  auto-populated from documented surgical case ; Clinical Service:   Surgery ; Last Reviewed Dt/Tm:   02/09/2020 10:51:08 EST            Procedure Dt/Tm:   07/05/2009 ; Anesthesia Minutes:   0 ; Procedure Name:   Aortic Valve Replacement ;  Procedure Minutes:   0 ; Last Reviewed Dt/Tm:   02/09/2020 10:51:08 EST            Anesthesia Minutes:   0 ; Procedure Name:   Hysterectomy ; Procedure Minutes:   0 ; Last Reviewed Dt/Tm:   02/09/2020 10:51:08 EST            ID Risk Screen Symptoms   Recent Travel History :   No recent travel   Close Contact with COVID-19 ID :   Preadmission testing patients only   Last 14 days COVID-19 ID :   Yes - Not Detected (negative)   TB Symptom Screen :   No symptoms   C. diff Symptom/History ID :   Neither of the above   Patient Pregnant :   None of the above   MRSA/VRE Screening :   None of these apply   CRE Screening :   No   Dareen PianoANDERSON, RN, KELLY A - 02/09/2020 10:50 EST   ID COVID-19 Screen   Fever OR Chills :   No   Headache :   No   New or Worsening Cough :   No   Fatigue :   No   Shortness of Breath ID :   No   Myalgia (Muscle Pain) :   No   Dyspnea :   No   Diarrhea :   No   Sore Throat :   No   Nausea :   No   Laryngitis :   No   Sudden Loss of Taste or Smell :   No   Dareen PianoANDERSON, RN, KELLY A - 02/09/2020 10:50 EST  Bloodless Medicine   Is Blood Transfusion Acceptable to Patient :   Yes   Dareen Piano, RN, KELLY A - 02/09/2020 10:50 EST   Nutrition   MST Does Your Current Diet Include :   None   Nutrition Screen for Malnutrition :   Patient denies   Adah Salvage A - 02/09/2020 10:50 EST   Functional   Sensory Deficits :   None   ADLs Prior to Admission :   Independent   Dareen Piano, RN, Tresa Endo A - 02/09/2020 10:50 EST   Social History   Social History   (As Of: 02/09/2020 10:52:36 EST)   Tobacco:        Tobacco use: Former smoker, quit more than 30 days ago.   (Last Updated: 12/15/2019 09:44:58 EST by Olam Idler R-RN)          Electronic Cigarette/Vaping:        Never Electronic Cigarette Use.   (Last Updated: 02/02/2020 10:27:15 EST by Chandra Batch, RN, HELEN E)          Alcohol:        Current, Wine, Daily   (Last Updated: 12/15/2019 09:45:19 EST by Olam Idler R-RN)          Substance Use:        Opioid Naive - not  currently taking opioids, Denies   (Last Updated: 02/02/2020 10:27:22 EST by Chandra Batch, RN, HELEN E)            Spiritual   Do you have a concern that you would like to address with a Chaplain? :   No   Do you have any religious/spiritual/cultural beliefs that could impact the way your care is provided? :   No   Dareen Piano, RN, KELLY A - 02/09/2020 10:50 EST   Harm Screen   Suspect or Concern for: :   None   Feels Safe Where Live :   Yes   Last 3 mo, thoughts killing self/others :   Patient denies   Cognitively Impaired :   No   Ambulatory or Self Mobile in [facility] :   No   Dareen Piano, RN, KELLY A - 02/09/2020 10:50 EST   Advance Directive   Location of Advance Directive :   Copy placed on paper chart   Advance Directive :   Yes   Type of Advance Directive :   Living will, Medical durable power of attorney   Adah Salvage A - 02/09/2020 10:50 EST   Education   Written Language :   [place]   Primary Language :   Hoyt Koch, RN, KELLY A - 02/09/2020 10:50 EST   Caregiver/Advocate Language   Patient :   [place] Maus, RN, KELLY A - 02/09/2020 10:50 EST   Barriers to Learning :   None evident   Dareen Piano, RN, Tresa Endo A - 02/09/2020 10:50 EST   Preventative Measures Information   Unit/Room Orientation :   Verbalizes understanding   Environmental Safety :   Verbalizes understanding   Hand Washing :   Verbalizes understanding   Infection Prevention :   Verbalizes understanding   DVT Prophylaxis :   Verbalizes understanding   Isolation Precaution :   Verbalizes understanding   ANDERSON, RN, KELLY A - 02/09/2020 10:50 EST   DC Needs   CM Living Situation :   Home with no services   Anticipated Discharge Needs :   None   Dareen Piano, RN, KELLY A - 02/09/2020 10:50 EST  Valuables and Belongings   Does Patient Have Valuables and Belongings :   Yes   Dareen Piano, RN, KELLY A - 02/09/2020 10:50 EST   Valuables and Belongings   At Bedside :   Clothes, Cell phone   Transfer within Facility :   Clothes, Cell phone   [place], RN, [place] A -  02/09/2020 10:50 EST   Admission Complete   Admission Complete :   Yes   Dareen Piano, RN, KELLY A - 02/09/2020 10:50 EST

## 2020-02-09 NOTE — Progress Notes (Signed)
Chaplaincy Note - Text       Chaplaincy Note Entered On:  02/09/2020 14:48 EST    Performed On:  02/09/2020 14:46 EST by Janey Genta               Chaplaincy Consult   Faith/Denomination :   None specified   Religious Spiritual Resources :   [facility]   Religious Spiritual Practices :   Prayer   Religious Spiritual Concerns :   Anxiety   Religious Family Issues :   Husband present with patient at time of rounding.   Additional Information, Chaplaincy :   Patient was awake and alert; in good spirits.  Enjoyed a brief pastoral chat reminding her to keep Spirits up!  Her procedure was just this morning.  Looks really strong.     Janey Genta - 02/09/2020 14:46 EST

## 2020-02-09 NOTE — Procedures (Signed)
IntraOp Record - [facility]             IntraOp Record - [facility] Summary                                                                    Primary Physician:        Willaim Sheng    Case Number:              [id number]    Finalized Date/Time:      02/09/20 10:58:19    Pt. Name:                 Jodi Powell, Jodi Powell    D.O.B./Sex:               [birth year]    Female    Med Rec #:                [id number]    Physician:                Zane Herald    Financial #:              [id number]    Pt. Type:                 I    Room/Bed:                 3008/01    Admit/Disch:              02/09/20 05:51:00 -    Institution:       [facility] - Case Attendance                                                                                                     Entry 1                         Entry 2                         Entry 3                                          Case Attendee             ROSS,  SCOTT-MD                 BUNTING,  Dimple Nanas,  ERIC C-MD    Role Performed            Surgeon Primary  Surgeon Secondary               Anesthesiologist    Time In                   02/09/20 07:15:00               02/09/20 07:15:00               02/09/20 07:15:00    Time Out                  02/09/20 08:47:00               02/09/20 08:47:00               02/09/20 08:47:00    Procedure                 Aortic Valve                    +TAVR Cardiologist                              Replacement                              Transfemoral Ba    Last Modified By:         Denna Haggard RN, Nicanor Bake, RN, Nicanor Bake, RNRicci Barker                              02/09/20 08:47:25               02/09/20 08:47:25               02/09/20 08:47:25                                Entry 4                         Entry 5                         Entry 6                                          Case Attendee             Denna Haggard, RN, Joette Catching, RN, Nira Retort    Role Performed             Circulator                      Circulator                      Surgical Scrub    Time In  02/09/20 07:15:00               02/09/20 07:15:00               02/09/20 07:15:00    Time Out                  02/09/20 08:47:00               02/09/20 08:47:00               02/09/20 08:47:00    Procedure     Last Modified By:         Denna Haggard RN, Nicanor Bake, RN, Nicanor Bake, RN, Ricci Barker                              02/09/20 08:47:25               02/09/20 08:47:25               02/09/20 08:47:25                                Entry 7                         Entry 8                         Entry 9                                          Case Attendee             Regenia Skeeter,  Johnnye Sima,  [facility] M    Role Performed            First Assistant                 Perfusionist                    Radiology Tech    Time In                   02/09/20 07:15:00               02/09/20 07:15:00               02/09/20 07:15:00    Time Out                  02/09/20 08:47:00               02/09/20 08:47:00               02/09/20 08:47:00    Procedure     Last Modified By:         Denna Haggard, RN, Nicanor Bake, RN,  Nicanor Bake, RNRicci Barker                              02/09/20 08:47:25               02/09/20 08:47:25               02/09/20 08:47:25                                Entry 10                                                                                                         Case Attendee             Azzie Roup    Role Performed            Radiology Tech    Time In                   02/09/20 07:15:00    Time Out                  02/09/20 08:47:00    Procedure     Last Modified By:         Jen Mow                              02/09/20 08:47:25      [facility] - Case Attendance Audit                                                                      02/09/20 08:47:25         Owner: Claudina Lick                                Modifier: ARNOMO                                                            1     <+> Time In            1     <+> Time Out            1     <*> Procedure  Aortic Valve Replacement Transfemoral Ba            2     <+> Time In            2     <+> Time Out            2     <*> Procedure                              +TAVR Cardiologist        <+> 3         Time In        <+> 3         Time Out        <+> 4         Time In        <+> 4         Time Out        <+> 5         Time In        <+> 5         Time Out        <+> 6         Time In        <+> 6         Time Out        <+> 7         Time In        <+> 7         Time Out        <+> 8         Time In        <+> 8         Time Out        <+> 9         Time In        <+> 9         Time Out        <+> 10        Time In        <+> 10        Time Out     02/09/20 07:48:41         Owner: ARNOMO                               Modifier: ARNOMO                                                        <+> 3         Case Attendee        <+> 3         Role Performed        <+> 4         Case Attendee        <+> 4         Role Performed        <+> 5         Case Attendee        <+> 5  Role Performed        <+> 6         Case Attendee        <+> 6         Role Performed        <+> 7         Case Attendee        <+> 7         Role Performed        <+> 8         Case Attendee        <+> 8         Role Performed        <+> 9         Case Attendee        <+> 9         Role Performed        <+> 10        Case Attendee        <+> 10        Role Performed        [facility] - Case Times                                                                                                          Entry 1                                                                                                          Patient      In Room Time             02/09/20 07:15:00               Out Room Time                   02/09/20 08:47:00    Anesthesia      Procedure      Start Time               02/09/20 07:53:00               Stop Time                       02/09/20 08:43:00    Last Modified By:         Denna Haggard RN, Ricci Barker                              02/09/20 08:47:18  [facility] - Case Times Audit                                                                           02/09/20 08:47:18         Owner: ARNOMO                               Modifier: ARNOMO                                                        <+> 1         Out Room Time        <+> 1         Stop Time     02/09/20 07:54:14         Owner: ARNOMO                               Modifier: ARNOMO                                                        <+> 1         Start Time        [facility] - General Case Data                                                                                                   Entry 1                                                                                                          Case Information      ASA Class                3                               Case Level  Level 6     OR                       RH Hybrid 01                    Specialty                       Cardiothoracic (SN)     Wound Class              None    Preop Diagnosis           I35.0                           Postop Diagnosis                I35.0    Last Modified By:         Denna Haggard RN, Ricci Barker                              02/09/20 07:49:29      [facility] - Procedures                                                                                                          Entry 1                         Entry 2                                                                          Procedure     Description      Procedure                Aortic Valve                    +TAVR Cardiologist                              Replacement                              Transfemoral Balloon                              Expanded     Modifiers      Surgical Procedure       AVR TRANSFEMORAL                AVR  TRANSFEMORAL  Text                     BALLOON [facility]    Primary Procedure         Yes                             No    Primary Surgeon           Willaim Sheng                 [place],  TROY-MD    Start                     02/09/20 07:53:00               02/09/20 07:53:00    Stop                      02/09/20 08:43:00               02/09/20 08:43:00    Anesthesia Type           Monitored Anesthesia            Other                              Care    Surgical Service          Cardiothoracic [facility])             Cardiology (SN)    Wound Class               1-Clean                         None    Last Modified By:         Denna Haggard, RN, Nicanor Bake, RN, Ricci Barker                              02/09/20 08:47:22               02/09/20 08:47:22      [facility] - Procedures Audit                                                                           02/09/20 08:47:22         Owner: ARNOMO                               Modifier: ARNOMO                                                        <+>  1         Start        <+> 1         Stop        <+> 2         Start        <+> 2         Stop        [facility] - [place]                                                                                   Pre-Care Text:            A.240 Assesses baseline skin condition  A280.1 Identifies baseline musculoskeletal status  Im.50 Implements           protective measures to prevent injury due to electrical sources   Im.60 Uses supplies and equipment within safe           parameters  Im.80 Applies safety devices                              Entry 1                                                                                                          ESU Type                  GENERATOR                       Identification                  [id number]                              COVIDIEN/[employer]              Number     Coag Setting (watts)      45                              Cut Setting (watts)              45    Grounding Pad             Yes                             Grounding Pad Site              Back    Needed?     Grounding Pad  HADER, RN, MICHAEL W            Outcome Met (O.10)              Yes    Applied By     Last Modified By:         Denna Haggard, RN, Ricci Barker                              02/09/20 07:51:58    Post-Care Text:            E.10 Evaluates for signs and symptoms of physical injury to skin and tissue  O.10 Patient is free from signs           and symptoms of injury related to thermal sources   O.70 Patient is free from signs and symptoms of electrical           injury      [facility] - Communication                                                                             Pre-Care Text:            A.520 Identifies barriers to communication (Patient and Family Communications)  A.20 Verifies operative           procedure, surgical site, and laterality [employer])  Im.500 Provides status reports to family           members  Im.150 Develops individualized plan of care                              Entry 1                                                                                                          Communication             Phone Call                      Communication By                Jen Mow    Date and Time             02/09/20 07:56:00               Communication To                UPDATED FRIEND    Last Modified By:         Denna Haggard, RN, Ricci Barker  02/09/20 08:29:48    Post-Care Text:            E.520 Evaluates psychosocial response to plan of care  O.500 Patient or designated support person demonstrates           knowledge of the expected psychosocial responses to the procedure  E.800 Ensures continuity of care  O.50           Patient's current status is communicated throughout the continuum of care      [facility] - Counts Initial and Final                                                                  Pre-Care Text:            A.20 Verifies  operative procedure, sugical site, and laterality  A.20.2 Assesses the risk for unintended           retained foreign body  Im.20 Performs required counts                              Entry 1                                                                                                          Initial Counts      Initial Counts           HADER, RN, MICHAEL W,           Items included in               Instruments, Sponges,     Performed By             Torrie Mayers,               the Initial Count               Sharps                              Azzie Roup    Final Counts      Final Counts             HADER, RN, Jacqulyn Ducking,           Final Count Status              Correct     Performed By             Jorene Minors     Items Included in  Sponges, Sharps     Final Count     Outcome Met (O.20)        Yes    Last Modified By:         Denna Haggard, RN, Ricci Barker                              02/09/20 07:53:05    Post-Care Text:            E.50 Evaluates results of the surgical count  O.20 Patient is free from unintended retained foreign objects      [facility] - Dressing/Packing                                                                          Pre-Care Text:            A.350 Assesses susceptibility for infection  Im.250 Administers care to invasive devices  Im.290 Administer           care to wound sites   Im.300 Implements aseptic technique                              Entry 1                                                                                                          Site                      Groin                           Site Details                    Bilateral    Dressing Item     Details      Dressing Item            Liquid Bandage     (Im.290)     Last Modified By:         Denna Haggard, RN, Ricci Barker                              02/09/20 07:54:09    Post-Care Text:            E.320 Evaluate factors associted with increased risk for postoperative  infection at the completion of the           procedure  O.200 Patient's wound perfusion is consistent with or improved from baseline levels   O.Patient is           free from signs and  symptoms of infection      [facility] - Fire Risk Assessment                                                                                                Entry 1                                                                                                          Fire Risk                 Alcohol Based Prep              Fire Risk Score                 2    Assessment: If            Solution, Ignition    checked, checkmark        Source In Use    = 1 point     Fire Risk Protocol     (Reference Only)     Last Modified By:         Denna Haggard, RN, Ricci Barker                              02/09/20 07:54:22      [facility] - Implants/Endoscopy Stents                                                                 Pre-Care Text:            A.20 Verifies operative procedure, surgical site, and laterality  A.20.1 Verifies consent for planned procedure            Im.350 Records implants inserted during the operative or invasive procedure                              Entry 1  Implant/Explant           Implant    Implant     Identification      Catalog #               [id number]                       Description                     VALVE AORTIC SAPIEN 3                                                                                              ULTRA THV W/                                                                                              [facility] SYSTEM     Expiration Date          06/22/21                        Manufacturer                    [employer]     Serial Number            [id number]    Usage Data      Implant Site             HEART - AORTIC VALVE            Quantity                        1    Bone and Tissue       Reconstituted            Yes                             Solution Used                   Saline     Lot Number               [id number]                         Other Solution                  EXP 12/2022    Last Modified By:         Denna Haggard, RN, Ricci Barker  02/09/20 08:08:39    Post-Care Text:            E.30 Evaluates verification process for correct patient, site, side and level surgery  O.30 Patient's procedure           is performed on the correct site, side, and level      [facility] - Medications                                                                               Pre-Care Text:            A.10 Confirms patient identity  A.30 Verifies allergies  Im.220 Administers prescribed medications  Im.220.2           Administers prescribed antibiotic therapy as ordered                              Entry 1                         Entry 2                         Entry 3                                          Time Administered     Medication                HEPARIN SODIUM                  LIDOCAINE 1% INJ                BUPIVACAINE 0.5% W/EPI                              INJECTION 1000UTS/ML                                            MPF INJ                              10 ML    Route of Admin            Irrigation                      Local Injection                 Local Injection    Dose/Volume               12000 UNITS TO STERILE          TO STERILE FIELDL          TO STERILE FIELDL    (include amount and       FIELD    unit  of measure)     Site                      Groin                           Groin                           Groin    Site Detail               Bilateral                       Bilateral                       Bilateral    Administered By           Ladean Raya,  TROY-MD               [employer],  TROY-MD    Outcome Met (O.130)       Yes                             Yes                             Yes    Last Modified ByDenna Haggard, RN, Nicanor Bake, RN, Nicanor Bake, RN, Ricci Barker                              02/09/20 07:55:50               02/09/20 07:55:50               02/09/20 07:55:50    Post-Care Text:            E.20 Evaluates response to medications  O.130 Patient receives appropriately administerd medication(s)      [facility] - Patient Care Devices                                                                      Pre-Care Text:            A.200 Assesses risk for normothermia regulation  A.40 Verifies presence of prosthetics or corrective devices           Im.280 Implements thermoregulation measures  Im.60 Uses supplies and equipment within safe parameters                              Entry 1  Entry 2                                                                          Equipment Type            MACHINE SEQUENTIAL              BAIR HUGGER                              COMPRESSION    SCD Sleeve Site           Legs Bilateral    Equipment/Tag Number      [id number]                       [id number]    Initiated Pre             Yes    Induction     Last Modified By:         Denna Haggard, RN, Nicanor Bake, RN, Ricci Barker                              02/09/20 07:56:21               02/09/20 07:56:21    Post-Care Text:            E.10 Evaluates signs and symptoms of physical injury to skin and tissue  O.60 Patient is free from signs and           symptoms of injury caused by extraneous objects      [facility] - Patient Positioning                                                                       Pre-Care Text:            A.240 Assesses baseline skin condition  A.280 Identifies baseline musculoskeletal status  A.280.1 Identifies           physical alterations that require additional precautions for procedure-specific positioning  A.510.8 Maintains           patient's dignity and privacy  Im.120 Implements protective measures to prevent skin/tissue injury due to           mechanical sources  Im.40 Positions the  patient  Im.80 Applies safety devices                              Entry 1  Procedure                 Aortic Valve                    Body Position                   Supine                              Replacement                              Transfemoral Ba    Left Arm Position         Tucked and Padded at            Right Arm Position              Tucked and Padded at                              Side                                                            Side    Left Leg Position         Extended                        Right Leg Position              Extended    Feet Uncrossed            Yes                             Pressure Points                 Yes                                                              Checked     Positioning Device        Head Rest Gel                   Positioned By                   Chryl Heck  ERIC C-MD,                                                                                              Denna Haggard, RN, Ricci Barker,  HADER, RN, Scherrie Gerlach,  Deno Lunger    Outcome Met (O.80)        Yes    Last Modified By:         Denna Haggard, RN, Ricci Barker                              02/09/20 07:57:12    Post-Care Text:            E.10 Evaluates for signs and symptoms of physical injury to skin and tissue  E.290 Evaluates musculoskeletal           status  O.80 Patient is free from signs and symptoms of injury related to positioning  O.120 the patient is           free from signs and symptoms of injury related to transfer/transport   O.250 Patient's musculoskeletal status           is maintained at or improved from baseline levels      [facility] - Time Out                                                                                   Pre-Care Text:            A.10 Confirms patient identity  A.20 Verifies operative procedure, surgical site, and laterality  A.20.1           Verifies consent for planned procedure  A.30 Verifies allergies                              Entry 1                                                                                                          Procedure                 Aortic Valve                    Patient name and  Yes                              Replacement                     DOB confirmed.                               Transfemoral Ba, +TAVR          Allergies                               Cardiologist                    confirmed. Surgical                                                               procedure to be                                                               performed confirmed                                                               and verified by                                                               completed surgical                                                               consent     Surgical site             Yes                             Essential imaging,              Yes    confirmed. Correct                                        required blood     surgical site  products, implants,     marked and initials                                       devices and/or     are visible through                                       special equipment     prepped and draped                                        available and     field (or                                                 sterilization     alternative ID band                                       indicators confirmed     used), if applicable     Surgeon shares            Yes                             Anesthesia shares               Yes    operative plan,                                           anesthetic plan and     anticipated                                                reviews patient     specimens                                                 specific concerns     discussed, possible                                       and confirms     difficulties,                                             administration of     expected duration,  antibiotics, if     anticipated blood                                         applicable     loss and reviews     all     critical/specific     concerns     Fire risk                 Yes                             Surgeon states:                 Yes    assessment scored                                         Does anyone have     and plan discussed                                        any concerns? If                                                               you see, suspect,                                                               or feel that                                                               patient care is                                                               being compromised,                                                               speak up for  patient safety     Time Out Complete         02/09/20 07:48:00    Last Modified By:         Denna Haggard RN, Ricci Barker                              02/09/20 07:57:29    Post-Care Text:            E.30 Evaluates verification process for correct patient, site, side, and level surgery      [facility] - Debrief                                                                                   Pre-Care Text:            Im.330 Manages specimen handling and disposition                              Entry 1                                                                                                          Procedure                 Aortic Valve                    Final counts                    Yes                              Replacement                      correct and                               Transfemoral Ba, +TAVR          verbally verified                               Cardiologist                    with  surgeon/licensed                                                               independent                                                               practitioner (if                                                               applicable)     Actual procedure          Yes                             Postop diagnosis                Yes    performed confirmed                                       confirmed     Wound                     Yes                             Confirm specimens               Yes    classification                                            and specimens     confirmed                                                 labeled                                                               appropriately (if                                                               applicable)  Foley catheter            Yes                             Patient recovery                Yes    removed (if                                               plan confirmed     applicable)     Debrief Complete          02/09/20 08:42:00    Last Modified By:         Denna Haggard, RN, Ricci Barker                              02/09/20 08:47:34    Post-Care Text:            E.800 Ensures continuity of care  E.50 Evaluates results of the surgical count  O.30 Patient's procedure is           performed on the correct site, side, and level  O.50 patient's current status is communicated throughout the           continuum of care  O.40 Patient's specimen(s) is managed in the appropriate manner      [facility] - Debrief Audit                                                                              02/09/20 08:47:34         Owner: ARNOMO                               Modifier: ARNOMO                                                             1     <*> Procedure                              Aortic Valve Replacement Transfemoral Ba, +TAVR Cardiologist            1     <+> Debrief Complete        [facility] - Skin Assessment  Pre-Care Text:            A.240 Assesses baseline skin condition  Im.120 Implements protective measures to prevent skin or tissue injury           due to mechanical sources   Im.280.1 Implements progective measures to prevent skin or tissue injury due to           thermal sources  Im.360 Monitors for signs and symptons of infection                              Entry 1                                                                                                          Skin Integrity            Intact    Last Modified By:         Denna Haggard, RN, Ricci Barker                              02/09/20 07:57:55    Post-Care Text:            E.10 Evaluates for signs and symptoms of physical injury to skin and tissue  E.270 Evaluate tissue perfusion           O.60 Patient is free from signs and symptoms of injury caused by extraneous objects    O.210 Patinet's tissue           perfusion is consistent with or improved from baseline levels      [facility] - Skin Prep                                                                                 Pre-Care Text:            A.30 Verifies allergies  A.20 Verifies procedure, surgical site, and laterality  A.510.8 Maintains paritnet's           dignity and privacy  Im.270 Performs Skin Preparation  Im.270.1 Implements protective measures to prevent skin           and tissue injury due to chemical sources   A.300.1 Protects from cross-contamination                              Entry 1  Hair Removal     Skin Prep      Prep Agents (Im.270)     Chlorhexidine Gluconate         Prep Area (Im.270)              Chin to knees                               2% w/Alcohol    Outcome Met (O.100)       Yes    Last Modified By:         Denna Haggard, RN, Ricci Barker                              02/09/20 07:59:26    Post-Care Text:            E.10 Evaluates for signs and symptoms of physical injury to skin and tissue  O.100 Patient is free from signs           and symptoms of chemical injury   O.740 The patient's right to privacy is maintained      [facility] - Transfer                                                                                                            Entry 1                                                                                                          Transferred By            Chryl Heck,  ERIC C-MD,            Via                             Alberteen Sam, RN, Ricci Barker    Post-op Destination       PACU    Skin Assessment      Condition                Intact    Last Modified ByDenna Haggard, RN, Ricci Barker  02/09/20 08:00:25      Case Comments                                                                                         <None>              Finalized By: Meryle Ready      Document Signatures                                                                             Signed By:           Myrna Blazer, Ricci Barker 02/09/20 08:47          YTKZS,  JENNIFER 02/09/20 10:58      Unfinalized History                                                                                     Date/Time            Username    Reason for Unfinalizing         Freetext Reason for Unfinalizing                                          02/09/20 10:58       WFUXNA      Charging

## 2020-02-09 NOTE — Op Note (Signed)
Phase I Record - [facility]             Phase I Record - [facility] Summary                                                                    Primary Physician:        ROSS,  SCOTT-MD    Case Number:              [id number]    Finalized Date/Time:      02/09/20 10:16:54    Pt. Name:                 Jodi Powell, Jodi Powell    D.O.B./Sex:               10-May-1950    Female    Med Rec #:                [id number]    Physician:                Zane Herald    Financial #:              [id number]    Pt. Type:                 I    Room/Bed:                 3008/01    Admit/Disch:              02/09/20 05:51:00 -    Institution:       [facility] Case Attendance - Phase I                                                                                             Entry 1                         Entry 2                         Entry 3                                          Case Attendee             ROSS,  SCOTT-MD                 BUNTING,  TROY-MD               McClellan,  Loretto-RN    Role Performed            Surgeon Primary                 Surgeon Secondary  Post Anesthesia Care                                                                                              Nurse    Time In     Time Out     Last Modified By:         Tressie Ellis,  Loretto-RN          [place],  Loretto-RN                              02/09/20 10:16:31               02/09/20 10:16:31               02/09/20 10:16:31      [facility] - Case Times - Phase I                                                                                                Entry 1                                                                                                          Phase I In                02/09/20 08:48:00               Phase I Out                     02/09/20 09:18:00    Phase I Discharge         02/09/20 09:18:00    Time     Last Modified By:         Laurier Nancy,  Loretto-RN                              02/09/20 10:16:53               Finalized By: Smiley Houseman      Document Signatures  Signed By:           Smiley Houseman 02/09/20 10:16

## 2020-02-09 NOTE — Op Note (Signed)
Phase II Record - [facility]             Phase II Record - [facility] Summary                                                                   Primary Physician:        Willaim Sheng    Case Number:              [id number]    Finalized Date/Time:      02/09/20 10:18:37    Pt. Name:                 Jodi Powell, Jodi Powell    D.O.B./Sex:               1950-02-10    Female    Med Rec #:                [id number]    Physician:                Zane Herald    Financial #:              [id number]    Pt. Type:                 I    Room/Bed:                 3008/01    Admit/Disch:              02/09/20 05:51:00 -    Institution:       [facility] Case Attendance - Phase II                                                                                            Entry 1                                                                                                          Case Attendee             Smiley Houseman          Role Performed                  Post Anesthesia Care  Nurse    Last Modified By:         Smiley Houseman                              02/09/20 10:17:09      [facility] - Case Times - Phase II                                                                                               Entry 1                                                                                                          Phase II In               02/09/20 09:18:00               Phase II Out                    02/09/20 09:58:00    Phase II Discharge        02/09/20 09:58:00    Time     Last Modified By:         Laurier Nancy,  Loretto-RN                              02/09/20 10:17:44              Finalized By: Smiley Houseman      Document Signatures                                                                             Signed By:           Smiley Houseman 02/09/20 10:18

## 2020-02-09 NOTE — Anesthesia Pre-Procedure Evaluation (Signed)
Preanesthesia Evaluation        Patient:   Jodi Powell, Jodi Powell             MRN: [id number]            FIN: [phone]               Age:   70 years     Sex:  Female     DOB:  1950/10/22   Associated Diagnoses:   None   Author:   Chryl Heck,  Ayanah Snader C-MD      Preoperative Information   Procedure/ Case: TAVR   NPO:  NPO greater than 8 hours.    Anesthesia history     Patient's history: negative.        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       Review of Systems   Metabolic Equivalents in Exercise Testing:  4-7.    Integumentary:  Negative.    Ear/Nose/Mouth/Throat:  Negative.    Respiratory:  Negative.    Cardiovascular:  Hypertension, Hyperlipidemia, Valve Disorder.    Vascular:  Negative.    Gastrointestinal:  GERD/heartburn.    Gynecologic:  Deferred.    Genitourinary:  Negative.    Endocrine:  Negative.    Musculoskeletal:  Negative.    Neurologic:  Negative.    Oncology/Hematology:  Deferred.       Health Status   Allergies:    Allergic Reactions (Selected)  Severe  Sulfa drugs- Headache.  Unknown  Adhesive Bandage- Rash.   Current medications:    Home Medications (8) Active  amLODIPine 5 mg oral tablet 5 mg = 1 tabs, Oral, qAM  fluticasone 27.5 mcg/inh nasal spray 2 sprays, PRN, Nasal, Daily  losartan-hydrochlorothiazide 100 mg-25 mg oral tablet 1 tabs, Oral, qAM  metoprolol succinate 25 mg oral tablet, extended release 25 mg = 1 tabs, Oral, qAM  pantoprazole 40 mg oral delayed release tablet 40 mg = 1 tabs, Oral, qAM  rosuvastatin 5 mg oral tablet 5 mg = 1 tabs, Oral, qAM  spironolactone 25 mg oral tablet 12.5 mg = 0.5 tabs, Oral, Every other day  Vitamin D3 2000 intl units oral capsule 50 mcg = 1 caps, Oral, Daily  ,    Medications (2) Active  Scheduled: (1)  iopamidol 61% Inj Soln 100 mL  200 mL, Intra-arterial, Once  Continuous: (1)  Sodium Chloride 0.9% intravenous solution 1,000 mL  1,000 mL, IV, 30 mL/hr  PRN: (0)     Problem list:    Active Problems  (7)  Aortic valve disease   AVM (arteriovenous malformation)   GI bleed   Hypercholesterolemia   Hypertension   Melanoma   Spinal stenosis, lumbar         Histories   Past Medical History:    No active or resolved past medical history items have been selected or recorded.   Procedure history:    Cardiac catheterization (82993716) on 12/27/2019 at 69 Years.  Aortic Valve Replacement on 07/05/2009 at 59 Years.  Appendectomy (967893810).  Hysterectomy ([id number]).   Social History        Social & Psychosocial Habits    Alcohol  12/15/2019  Use: Current    Type: Wine    Frequency: Daily    Substance Use  02/02/2020  Opioid Assessment Opioid Naive-not taking    Use: Denies    Tobacco  12/15/2019  Use: Former smoker, quit more  Electronic Cigarette/Vaping  02/02/2020  Electronic Cigarette Use: Never  .     Symptoms of Sleep Apnea Score: PAT Documentation: 2                        02/02/2020 10:16 EST     PONV Risk Score: PAT Documentation   3 02/02/2020 10:27 EST   3 12/27/2019 12:56 EST         Physical Examination   Vital Signs   02/09/2020 5:57 EST Systolic Blood Pressure 118 mmHg    Diastolic Blood Pressure 71 mmHg    Temperature Oral 36.4 degC    SBP/DBP Cuff Details Left arm, Automated   02/09/2020 5:56 EST Systolic Blood Pressure 117 mmHg    Diastolic Blood Pressure 58 mmHg  LOW    Heart Rate Monitored 68 bpm    Respiratory Rate 18 br/min    SpO2 99 %    SBP/DBP Cuff Details Right arm, Automated         Vital Signs (last 24 hrs)_____  Last Charted___________  Temp Oral     36.4 degC  (JAN 12 05:57)  Resp Rate         18 br/min  (JAN 12 05:56)  SBP      118 mmHg  (JAN 12 05:57)  DBP      71 mmHg  (JAN 12 05:57)  SpO2      99 %  (JAN 12 05:56)  Weight      69 kg  (JAN 12 05:51)  Height      160 cm  (JAN 12 05:51)  BMI      26.95  (JAN 12 05:51)     Measurements from flowsheet : Measurements   02/09/2020 5:51 EST Body Mass Index est meas 26.95 kg/m2    Body Mass Index Measured 26.95 kg/m2   02/09/2020 5:51 EST Height/Length  Measured 160 cm    Weight Measured 69 kg    Weight Dosing 69 kg      Pain assessment:  Pain Assessment   02/09/2020 5:56 EST Numeric Rating Pain Scale 0 = No pain    Numeric Pain Rating Comfort Function Goal 4      .    General:          Stress: No acute distress.         Appearance: Obese.    Airway:          Mallampati classification: I (soft palate, fauces, uvula, pillars visible).         Thyromental Distance: Normal.         Mouth: Teeth ( Within normal limits ).         Throat: Within normal limits.    Head:  Normocephalic, Atraumatic.    Neck:  Full range of motion.    Respiratory:  Breath sounds are equal.    Cardiovascular:  Normal rate, Regular rhythm.    Gastrointestinal:  Deferred.    Musculoskeletal     Deferred.     Integumentary:  Intact, Warm, Dry.    Neurologic:  Alert, Oriented, Normal sensory, Normal motor function.       Review / Management   Results review:     No qualifying data available, Lab results   02/09/2020 6:17 EST EABORh Interp O POS    ECABS Interp Negative    Computer XM Interp Compatible    Computer XM Interp Compatible    Computer XM Interp Compatible  Computer XM Interp Compatible   02/09/2020 6:16 EST Glucose POC 110.0 mg/dL   1/54/0086 7:61 EST Estimated Creatinine Clearance 81.30 mL/min   02/09/2020 5:51 EST Product Ready Done   .       Assessment and Plan   American Society of Anesthesiologists#(ASA) physical status classification:  Class III.    Anesthetic Preoperative Plan     Anesthetic technique: Monitored anesthesia care, Intravenous.     Opioid Assessment: Opioid Na????ve.     Risks discussed: nausea, vomiting, serious complications.     Signature Line     Electronically Signed on 02/09/2020 07:53 AM EST   ________________________________________________   Chryl Heck,  Aysen Shieh C-MD

## 2020-02-09 NOTE — Anesthesia Post-Procedure Evaluation (Signed)
Postanesthesia Evaluation        Patient:   Jodi Powell, Jodi Powell             MRN: [id number]            FIN: [phone]               Age:   70 years     Sex:  Female     DOB:  05-Aug-1950   Associated Diagnoses:   None   Author:   Chryl Heck,  Aren Pryde C-MD      Postoperative Information   Post Operative Info:          Post operative day: Post Anesthesia Care Unit.         Patient location: PACU.       Assessment   Postanesthesia assessment   Vitals: Vital Signs   02/09/2020 8:48 EST Systolic Blood Pressure 117 mmHg    Diastolic Blood Pressure 62 mmHg    Temperature Oral 36.5 degC    Heart Rate Monitored 65 bpm    Respiratory Rate 18 br/min    SpO2 96 %   02/09/2020 5:57 EST Systolic Blood Pressure 118 mmHg    Diastolic Blood Pressure 71 mmHg    Temperature Oral 36.4 degC    SBP/DBP Cuff Details Left arm, Automated   02/09/2020 5:56 EST Systolic Blood Pressure 117 mmHg    Diastolic Blood Pressure 58 mmHg  LOW    Heart Rate Monitored 68 bpm    Respiratory Rate 18 br/min    SpO2 99 %    SBP/DBP Cuff Details Right arm, Automated      , Measurements from flowsheet : Measurements   02/09/2020 5:51 EST Body Mass Index est meas 26.95 kg/m2    Body Mass Index Measured 26.95 kg/m2   02/09/2020 5:51 EST Height/Length Measured 160 cm    Weight Measured 69 kg    Weight Dosing 69 kg      .     Respiratory function: Respiratory rate, airway, and oxygen saturation are at adequate levels.     Cardiovascular function: Heart Rate stable, Blood Pressure stable, Postoperative hydration status Adequate.     Mental status: appropriate for level of anesthesia.     Temperature: within normal limits.     Pain Control: Adequate.     Nausea/Vomiting: Absent.     Signature Line     Electronically Signed on 02/09/2020 08:55 AM EST   ________________________________________________   Chryl Heck,  Shiraz Bastyr C-MD

## 2020-02-09 NOTE — Assessment & Plan Note (Signed)
PreOp Record - [facility]             PreOp Record - [facility] Summary                                                                      Primary Physician:        Willaim Sheng    Case Number:              [id number]    Finalized Date/Time:      02/09/20 06:42:28    Pt. Name:                 Jodi Powell, Jodi Powell    D.O.B./Sex:               1950/09/08    Female    Med Rec #:                [id number]    Physician:                Zane Herald    Financial #:              [id number]    Pt. Type:                 I    Room/Bed:                 /    Admit/Disch:              02/09/20 05:51:00 -    Institution:       [facility] Case Attendance - PreOp                                                                                               Entry 1                         Entry 2                         Entry 3                                          Case Attendee             ROSS,  SCOTT-MD                 BUNTING,  TROY-MD               GRAY, RN, HEATHER D    Role Performed            Surgeon Primary                 Surgeon Secondary  Preoperative Nurse    Time In                   n/a                             n/a                             n/a    Time Out                  n/a                             n/a                             n/a    Last Modified By:         Wallace Cullens RN, Pricilla Larsson, RN, Pricilla Larsson, RN, HEATHER D                              02/09/20 05:51:58               02/09/20 05:51:58               02/09/20 05:51:58      [facility] - Case Times - PreOp                                                                                                  Entry 1                                                                                                          Patient In Room Time      02/09/20 05:52:00    Nurse In Time             02/09/20 05:58:00    Nurse Out Time            02/09/20 06:42:00    Patient Ready for         02/09/20 06:42:00    Surgery/Procedure     Last Modified  By:         Wallace Cullens RN, Juleen China  02/09/20 06:42:21      [facility] - Case Times - PreOp Audit                                                                   02/09/20 06:42:21         Owner: Gerre Couch                               Modifier: [email or web address]                                                        <+> 1         Patient Ready for Surgery/Procedure        <+> 1         Nurse Out Time                Finalized By: Joylene Grapes D      Document Signatures                                                                             Signed By:           Gwyneth Sprout, Herbert Seta D 02/09/20 06:42

## 2020-02-10 LAB — CULTURE, MRSA, SURVEILLANCE

## 2020-02-10 LAB — BASIC METABOLIC PANEL
Anion Gap: 12 mmol/L (ref 2–17)
BUN: 15 mg/dL (ref 8–23)
CO2: 20 mmol/L — ABNORMAL LOW (ref 22–29)
Calcium: 8.4 mg/dL — ABNORMAL LOW (ref 8.8–10.2)
Chloride: 108 mmol/L — ABNORMAL HIGH (ref 98–107)
Creatinine: 0.5 mg/dL (ref 0.5–1.0)
GFR African American: 114 mL/min/{1.73_m2} (ref >=90)
GFR Non-African American: 98 mL/min/{1.73_m2} (ref >=90)
Glucose: 96 mg/dL (ref 70–99)
OSMOLALITY CALCULATED: 280 mOsm/kg (ref 270–287)
Potassium: 3.8 mmol/L (ref 3.5–5.3)
Sodium: 140 mmol/L (ref 135–145)

## 2020-02-10 LAB — CBC
Hematocrit: 37.8 % (ref 34.0–47.0)
Hemoglobin: 12.3 g/dL (ref 11.5–15.7)
MCH: 32.4 pg (ref 27.0–34.5)
MCHC: 32.5 g/dL (ref 32.0–36.0)
MCV: 99.5 fL — ABNORMAL HIGH (ref 81.0–99.0)
MPV: 11.4 fL (ref 7.2–13.2)
NRBC Absolute: 0 10*3/uL (ref 0.000–0.012)
NRBC Automated: 0 % (ref 0.0–0.2)
Platelets: 150 10*3/uL (ref 140–440)
RBC: 3.8 x10e6/mcL (ref 3.60–5.20)
RDW: 12.8 % (ref 11.0–16.0)
WBC: 6 10*3/uL (ref 3.8–10.6)

## 2020-02-10 LAB — MAGNESIUM: Magnesium: 1.9 mg/dL (ref 1.6–2.6)

## 2020-02-10 NOTE — Nursing Note (Signed)
Nursing Discharge Summary - Text       Nursing Discharge Summary Entered On:  02/10/2020 12:32 EST    Performed On:  02/10/2020 12:31 EST by Dareen Piano, RN, KELLY A               DC Information   Discharge To :   Home independently, Family support   Mode of Discharge :   Ambulatory   Transportation :   Private vehicle   Accompanied By :   Basilio [place], RN, KELLY A - 02/10/2020 12:31 EST   Education   Responsible Learner(s) :   No Data Available     Home Caregiver Present for Session :   Yes   Barriers To Learning :   None evident   Teaching Method :   Explanation, Printed materials   [place], RN, [place] A - 02/10/2020 12:31 EST   Post-Hospital Education Adult Grid   Activity Expectations :   Verbalizes understanding   Importance of Follow-Up Visits :   Verbalizes understanding   Physical Limitations :   Verbalizes understanding   Plan of Care :   Verbalizes understanding   Postoperative Instructions :   Verbalizes understanding   When to Call Health Care Provider :   [facility] understanding   [place], RN, Tresa Endo A - 02/10/2020 12:31 EST   Health Maintenance Education Adult Grid   Allergies :   Verbalizes understanding   Bathing/Hygiene :   TEFL teacher understanding   Diet/Nutrition :   TEFL teacher understanding   Exercise :   TEFL teacher understanding   Immunizations :   TEFL teacher understanding   Oral Care :   Verbalizes understanding   Dareen Piano, RN, Tresa Endo A - 02/10/2020 12:31 EST   Medication Education Adult Grid   Med Generic/Brand Name, Purpose, Action :   IT sales professional, Medication :   Verbalizes understanding   Dareen Piano, RN, Tresa Endo A - 02/10/2020 12:31 EST   Safety Education Adult Grid   Safety, Fall :   Verbalizes understanding   [place], RN, [place] A - 02/10/2020 12:31 EST   Time Spent Educating Patient :   18 minutes   Dareen Piano, RN, [place] A - 02/10/2020 12:31 EST

## 2020-02-10 NOTE — Case Communication (Signed)
CM D/C Planning Assessment Ongoing- Text       CM Admission Assessment Entered On:  02/10/2020 12:02 EST    Performed On:  02/10/2020 11:58 EST by Joellyn Quails M-RN               CM Admission Assessment   CM Reason for Care Management Referral :   Discharge planning assessment   CM Insurance Information :   Managed Medicare   CM Insurance Co. Name :   [employer]    CM Prescription Coverage :   Medicare D   [facility]   CM Date and Time Inpatient Order :   02/09/2020 5:51 EST   CM Date and Time Admission IM :   02/09/2020 5:43 EST   CM Patient has PCP :   Yes   Discharge Transporation Needs :   No   CM Assessment Discussion With :   Patient   CM Name of PCP :   Dr. Suzie Portela   CM Home/Lay Caregiver Name/Relationship :   Jonny Ruiz (brother) [phone]   CM Living Situation :   Home with no services   CM Initial Tentative Discharge Plan :   Home   CM Alternate Discharge Plan :   Home Health    Anticipated Hosp Related Barriers to DC :   None identified   CM Home Barriers :   None   CM Progress Note :   02/10/20: JM: s/p TAVR; IM 02/09/20 @ 0543; patient lives alone; IADLs; brother Jonny Ruiz will transport home; no current services, no DME; patient states she will have a friend stay with her for a week or so to assist with her recovery; discussed HH options and patient prefers [facility]; anticipate home with no needs.      Joellyn Quails M-RN - 02/10/2020 11:58 EST

## 2020-02-10 NOTE — Case Communication (Signed)
CM Discharge Planning Assessment - Text       CM Discharge Plan Entered On:  02/10/2020 12:03 EST    Performed On:  02/10/2020 12:02 EST by Joellyn Quails M-RN               CM Discharge Plan   Discharge To :   Home independently, Family support   CM Home/Lay Caregiver Name/Relationship :   Jonny Ruiz (brother) [phone]   [facility] Services :   For additional questions or concerns, please call Dr. Tenny Craw' office at [phone].       CM Medicare Patient :   Yes   CM StCM Statusatus :   IP   CM Date/Time Discharge IM :   02/09/2020 5:43 EST   Joellyn Quails M-RN - 02/10/2020 12:02 EST   CM Progress Note :   02/10/20: JM: s/p TAVR; IM 02/09/20 @ 0543; patient lives alone; IADLs; brother Jonny Ruiz will transport home; no current services, no DME; patient states she will have a friend stay with her for a week or so to assist with her recovery; discussed HH options and patient prefers [facility]; anticipate home with no needs.     02/10/20: JM; patient cleared for DC to home; anticipate no needs     Joellyn Quails M-RN - 02/10/2020 12:04 EST

## 2020-02-14 NOTE — Other (Signed)
Outpatient Cardiac Rehab Order       Outpatient Cardiac Rehabilitation Order Entered On:  02/22/2020 13:41 EST    Performed On:  02/22/2020 13:40 EST by Herminio Commons Corrine               Outpatient Cardiac Rehabilitation Order   Phase II Cardiac Rehab :   Phase II - Up to 12 weeks of continuous EKG telemetry monitoring during exercise and educational class series   Begin Cardiac Rehabilitation :   Patient is stable and cleared to begin Cardiac Rehabilitation immediately   Referring Physician :   Leo Grosser Corrine - 02/22/2020 13:40 EST   Cardiac Rehab Phase II   Valve Surgery :   Valve Surgery   Valve Surgery Date :   02/09/2020 EST   Herminio Commons Corrine - 02/22/2020 13:40 EST    Signature Line     Electronically Signed on 02/22/2020 01:40 PM EST   ________________________________________________   Herminio Commons Corrine      Electronically Signed on 02/23/2020 08:49 AM EST   ________________________________________________   Tenny Craw,  SCOTT-MD

## 2020-02-15 LAB — PROTIME-INR
INR: 1.4 — ABNORMAL LOW (ref 1.5–3.5)
Protime: 16.5 seconds — ABNORMAL HIGH (ref 11.6–14.5)

## 2020-02-23 LAB — COMPREHENSIVE METABOLIC PANEL
ALT: 18 U/L (ref 0–33)
AST: 21 U/L (ref 0–32)
Albumin/Globulin Ratio: 2 mmol/L (ref 1.00–2.70)
Albumin: 4.3 g/dL (ref 3.5–5.2)
Alk Phosphatase: 119 U/L — ABNORMAL HIGH (ref 35–117)
Anion Gap: 15 mmol/L (ref 2–17)
BUN: 22 mg/dL (ref 8–23)
CO2: 23 mmol/L (ref 22–29)
Calcium: 9.6 mg/dL (ref 8.8–10.2)
Chloride: 101 mmol/L (ref 98–107)
Creatinine: 0.7 mg/dL (ref 0.5–1.0)
GFR African American: 102 mL/min/{1.73_m2} (ref >=90)
GFR Non-African American: 88 mL/min/{1.73_m2} — ABNORMAL LOW (ref >=90)
Globulin: 2 g/dL (ref 1.9–4.4)
Glucose: 81 mg/dL (ref 70–99)
OSMOLALITY CALCULATED: 280 mOsm/kg (ref 270–287)
Potassium: 4 mmol/L (ref 3.5–5.3)
Sodium: 139 mmol/L (ref 135–145)
Total Bilirubin: 0.4 mg/dL (ref 0.00–1.20)
Total Protein: 6.5 g/dL (ref 6.4–8.3)

## 2020-02-23 LAB — LIPID PANEL
Chol/HDL Ratio: 2.8 (ref 0.0–4.4)
Cholesterol: 166 mg/dL (ref 100–200)
HDL: 59 mg/dL (ref >=50)
LDL Cholesterol: 74.2 mg/dL (ref 0.0–100.0)
LDL/HDL Ratio: 1.3
Triglycerides: 164 mg/dL — ABNORMAL HIGH (ref 0–149)
VLDL: 32.8 mg/dL (ref 5.0–40.0)

## 2020-02-23 LAB — VITAMIN D 25 HYDROXY: Vit D, 25-Hydroxy: 54.6 ng/mL (ref 30.0–90.0)

## 2020-02-23 LAB — CBC
Hematocrit: 37.5 % (ref 34.0–47.0)
Hemoglobin: 12.8 g/dL (ref 11.5–15.7)
MCH: 32.2 pg (ref 27.0–34.5)
MCHC: 34.1 g/dL (ref 32.0–36.0)
MCV: 94.5 fL (ref 81.0–99.0)
MPV: 11.3 fL (ref 7.2–13.2)
NRBC Absolute: 0 10*3/uL (ref 0.000–0.012)
NRBC Automated: 0 % (ref 0.0–0.2)
Platelets: 335 10*3/uL (ref 140–440)
RBC: 3.97 x10e6/mcL (ref 3.60–5.20)
RDW: 12.4 % (ref 11.0–16.0)
WBC: 7.5 10*3/uL (ref 3.8–10.6)

## 2020-02-23 LAB — PROTIME-INR
INR: 3.9 — ABNORMAL HIGH (ref 1.5–3.5)
Protime: 37.8 seconds — ABNORMAL HIGH (ref 11.6–14.5)

## 2020-02-23 LAB — HEMOGLOBIN A1C
Est. Avg. Glucose, WB: 114
Est. Avg. Glucose-calculated: 122
Hemoglobin A1C: 5.6 % (ref 4.0–6.0)

## 2020-02-23 LAB — N TERMINAL PROBNP (AKA NTPROBNP): NT Pro-BNP: 237 pg/mL — ABNORMAL HIGH (ref 0–125)

## 2020-02-23 LAB — TSH WITH REFLEX TO FT4: TSH: 1.05 mcIU/mL (ref 0.358–3.740)

## 2020-03-03 LAB — PROTIME-INR
INR: 6.8 (ref 1.5–3.5)
Protime: 58.5 seconds — ABNORMAL HIGH (ref 11.6–14.5)

## 2020-03-06 LAB — PROTIME-INR
INR: 1.4 — ABNORMAL LOW (ref 1.5–3.5)
Protime: 17.2 seconds — ABNORMAL HIGH (ref 11.6–14.5)

## 2020-03-10 LAB — PROTIME-INR
INR: 1.4 — ABNORMAL LOW (ref 1.5–3.5)
Protime: 16.8 seconds — ABNORMAL HIGH (ref 11.6–14.5)

## 2020-03-11 LAB — POC COVID-19 (LIAT IN HOUSE): SARS-CoV-2: NOT DETECTED

## 2020-03-16 LAB — PROTIME-INR
INR: 2.1 (ref 1.5–3.5)
Protime: 23.7 seconds — ABNORMAL HIGH (ref 11.6–14.5)

## 2020-03-23 LAB — PROTIME-INR
INR: 3.5 (ref 1.5–3.5)
Protime: 34.8 seconds — ABNORMAL HIGH (ref 11.6–14.5)

## 2020-03-31 LAB — PROTIME-INR
INR: 2.8 (ref 1.5–3.5)
Protime: 29.4 seconds — ABNORMAL HIGH (ref 11.6–14.5)

## 2020-04-07 LAB — PROTIME-INR
INR: 2.6 (ref 1.5–3.5)
Protime: 27.8 seconds — ABNORMAL HIGH (ref 11.6–14.5)

## 2020-04-27 LAB — PROTIME-INR
INR: 2.4 (ref 1.5–3.5)
Protime: 26.1 seconds — ABNORMAL HIGH (ref 11.6–14.5)

## 2020-08-28 ENCOUNTER — Other Ambulatory Visit: Payer: Self-pay | Admitting: Interventional Cardiology

## 2020-09-25 ENCOUNTER — Ambulatory Visit
Admit: 2020-09-25 | Discharge: 2020-09-25 | Payer: PRIVATE HEALTH INSURANCE | Attending: Family Medicine | Primary: Family Medicine

## 2020-09-25 DIAGNOSIS — Z0000 Encounter for general adult medical examination without abnormal findings: Secondary | ICD-10-CM

## 2020-09-25 NOTE — Progress Notes (Signed)
Medicare Annual Wellness Visit    Jodi Powell is here for Medicare AWV (SAWV.  No concerns.  Having allergy shots weekly.)    SAWV-BUBBLESHEET DONE  Labs- none ordered  Labs x 2 pended    *2nd valve replacement (TAVR)    HM: MM and DXA- 02/2020, [place]-01/2019-[place]-repeat 4 yrs , EKG-11/29/19-Dr. Claris Gladden.  ETOH-1 glass with dinner 5 nights a week. Lives in [place]. [place], moved from [place]. Has one/sister living in [place]. [place]. Work with [employer],  retired from [employer].  Twin: Jodi Powell (also patient).  Ht 63 in, Wt 149.9 lbs, BMI 26.55 Index, BP 121/77 mm Hg, Pulse Sitting 60, Oxygen saturation 98 %.  Are you a former smoker  How long has it been since you last smoked? > 10 years  Additional Findings: Tobacco Non-User Ex-moderate cigarette smoker (10-19/day)  Tobacco Use  Number of packs smoked per day 0.5  Number of years smoked 40  Number of pack years 20  Tobacco cessation      Assessment & Plan   Medicare annual wellness visit, subsequent  Assessment & Plan:  History reviewed and discussed with patient, Functional Abilities assessment tool reviewed and discussed with patient, Depression Assessment tool reviewed and discussed with patient, Health Risk Assessment tool reviewed and discussed with patient, Medications reviewed including any opioid usage    Time spent on depression screening: 2 minutes      Time Spent on Cardiovascular Risk Reduction Counselling: 10 minutes      Breast cancer screening by mammogram  -     MAM TOMO DIGITAL SCREEN BILATERAL; Future  Essential hypertension  Assessment & Plan:  Chronic, stable  Continue current management     Orders:  -     CBC; Future  -     Comprehensive Metabolic Panel; Future  -     Lipid Panel; Future  -     CBC  -     Comprehensive Metabolic Panel  -     Lipid Panel  -     TSH with Reflex to FT4  Dyslipidemia  Assessment & Plan:  Chronic, stable  Previous lipids w/ Dr Pinto-cardiology  EKG 12/18/20 w/ cardiology  Continue current  management     Recurrent gastrointestinal hemorrhage  Assessment & Plan:  Chronic, stable  No recent issues    Seasonal allergies  Assessment & Plan:  Chronic, stable  Continue current management     Chronic GERD  Assessment & Plan:  Chronic, stable  Continue current management     Thyroid nodule  Assessment & Plan:  Chronic, stable  [place] 11/2019 w/ benign appearing nodule. No recommended f/u   Orders:  -     TSH with Reflex to FT4; Future  -     TSH with Reflex to FT4  S/P aortic valve replacement with bioprosthetic valve  Assessment & Plan:  Chronic, stable  Patient s/p aortic valve replacement 06/2009  She is now followed by Dr Claris Gladden  With her GI bleeds, she has declining status of her valve with each episode       Nonrheumatic aortic (valve) stenosis  Assessment & Plan:  Chronic, stable  Patient s/p aortic valve replacement 06/2009 and 2022  She is now followed by Dr Claris Gladden       Vitamin D deficiency  -     Vitamin D 25 Hydroxy; Future  -     Vitamin D 25 Hydroxy    Recommendations for Preventive Services Due: see orders  and patient instructions/AVS.  Recommended screening schedule for the next 5-10 years is provided to the patient in written form: see Patient Instructions/AVS.     Return in 1 year (on 09/25/2021) for Medicare Annual Wellness Visit in 1 year.     Subjective       Patient's complete Health Risk Assessment and screening values have been reviewed and are found in Flowsheets. The following problems were reviewed today and where indicated follow up appointments were made and/or referrals ordered.    Positive Risk Factor Screenings with Interventions:             General Health and ACP:  General  In general, how would you say your health is?: Good  In the past 7 days, have you experienced any of the following: New or Increased Pain, New or Increased Fatigue, Loneliness, Social Isolation, Stress or Anger?: No  Do you get the social and emotional support that you need?: Yes  Do you have a Living Will?:  Yes    Advance Directives       Power of Attorney Living Will ACP-Advance Directive ACP-Power of Attorney    Not on File Not on File Not on File Not on File          General Health Risk Interventions:  Stress: relaxation techniques discussed, medication prescribed-                Objective   Vitals:    09/25/20 1100   BP: 126/76   Pulse: 57   SpO2: 96%   Weight: 158 lb 4.8 oz (71.8 kg)   Height: 5\' 3"  (1.6 m)      Body mass index is 28.04 kg/m??.      GENERAL APPEARANCE: well developed, well nourished, Patient is alert and oriented X 3. No acute distress. Appropriate affect., looks stated age.   EYES: pupils equal, round, reactive to light.   LYMPH NODES: no lymphadenopathy.   HEART: no murmurs, rubs, gallops, or thrills, normal rate and regular rhythm.   LUNGS: clear to auscultation bilaterally, good air movement, no wheezes, rales, rhonchi.   SKIN: good turgor, no rashes noted, warm and dry.   MUSCULOSKELETAL: full range of motion, no swelling or deformity.   EXTREMITIES: no edema, clubbing or cyanosis.   NEUROLOGIC: nonfocal, motor strength normal upper and lower extremities, sensory exam intact.   PSYCH: alert, oriented, cognitive function intact, judgment and insight good, mood/affect appropriate.          Allergies   Allergen Reactions    Latex Rash    Sulfa Antibiotics Headaches    Adhesive Tape Rash    Sulfur Rash     Prior to Visit Medications    Medication Sig Taking? Authorizing Provider   aspirin 81 MG EC tablet 81 mg Yes Historical Provider, MD   cephALEXin (KEFLEX) 500 MG capsule TAKE ONE CAPSULE THREE TIMES A DAY FOR SEVEN DAYS. Yes Historical Provider, MD   EPINEPHrine (EPIPEN) 0.3 MG/0.3ML SOAJ injection PLEASE SEE ATTACHED FOR DETAILED DIRECTIONS Yes Historical Provider, MD   triamcinolone (NASACORT) 55 MCG/ACT nasal inhaler SPRAY 2 SPRAYS INTO EACH NOSTRIL EVERY DAY Yes Historical Provider, MD   Cholecalciferol (VITAMIN D) 50 MCG (2000 UT) CAPS capsule Take 1 capsule by mouth in the morning. Yes  Historical Provider, MD   amLODIPine (NORVASC) 5 MG tablet 1 tablet Orally Once a day for 90 days Yes [facility] Automatic Reconciliation, MD   fluticasone (FLONASE) 50 MCG/ACT nasal spray 1 spray in  each nostril Nasally Once a day for 90 days Yes [facility] Automatic Reconciliation, MD   losartan-hydroCHLOROthiazide (HYZAAR) 100-25 MG per tablet 1 tablet Orally Once a day for 90 days Yes [facility] Automatic Reconciliation, MD   metoprolol succinate (TOPROL XL) 25 MG extended release tablet 1 tablet Orally Once a day for 90 days Yes [facility] Automatic Reconciliation, MD   pantoprazole (PROTONIX) 40 MG tablet Take 40 mg by mouth daily Yes [facility] Automatic Reconciliation, MD   rosuvastatin (CRESTOR) 5 MG tablet 1 tablet Orally Once a day for 90 days Yes [facility] Automatic Reconciliation, MD   spironolactone (ALDACTONE) 25 MG tablet 1/2 tab Orally qod Yes [facility] Automatic Reconciliation, MD       CareTeam (Including outside providers/suppliers regularly involved in providing care):   Patient Care Team:  Malena Edman, MD as PCP - General  Megan Mans, MD as Consulting Physician (Internal Medicine)     Reviewed and updated this visit:  Tobacco   Allergies   Meds   Problems   Med Hx   Surg Hx   Soc Hx   Fam Hx

## 2020-09-25 NOTE — Assessment & Plan Note (Signed)
Chronic, stable  [place] 11/2019 w/ benign appearing nodule. No recommended f/u

## 2020-09-25 NOTE — Assessment & Plan Note (Signed)
Chronic, stable  Previous lipids w/ Dr Pinto-cardiology  EKG 12/18/20 w/ cardiology  Continue current management

## 2020-09-25 NOTE — Patient Instructions (Signed)
Personalized Preventive Plan for Grettell Ransdell - 09/25/2020  Medicare offers a range of preventive health benefits. Some of the tests and screenings are paid in full while other may be subject to a deductible, co-insurance, and/or copay.    Some of these benefits include a comprehensive review of your medical history including lifestyle, illnesses that may run in your family, and various assessments and screenings as appropriate.    After reviewing your medical record and screening and assessments performed today your provider may have ordered immunizations, labs, imaging, and/or referrals for you.  A list of these orders (if applicable) as well as your Preventive Care list are included within your After Visit Summary for your review.    Other Preventive Recommendations:    A preventive eye exam performed by an eye specialist is recommended every 1-2 years to screen for glaucoma; cataracts, macular degeneration, and other eye disorders.  A preventive dental visit is recommended every 6 months.  Try to get at least 150 minutes of exercise per week or 10,000 steps per day on a pedometer .  Order or download the FREE "Exercise & Physical Activity: Your Everyday Guide" from [employer]. Call (947) [phone] or search [employer] online.  You need 1200-1500 mg of calcium and 1000-2000 IU of vitamin D per day. It is possible to meet your calcium requirement with diet alone, but a vitamin D supplement is usually necessary to meet this goal.  When exposed to the sun, use a sunscreen that protects against both UVA and UVB radiation with an SPF of 30 or greater. Reapply every 2 to 3 hours or after sweating, drying off with a towel, or swimming.  Always wear a seat belt when traveling in a car. Always wear a helmet when riding a bicycle or motorcycle.

## 2020-09-25 NOTE — Assessment & Plan Note (Addendum)
Chronic, stable  Patient s/p aortic valve replacement 06/2009 and 2022  She is now followed by Dr Claris Gladden

## 2020-09-25 NOTE — Assessment & Plan Note (Signed)
Chronic, stable  Continue current management

## 2020-09-25 NOTE — Assessment & Plan Note (Signed)
Chronic, stable  Patient s/p aortic valve replacement 06/2009  She is now followed by Dr Claris Gladden  With her GI bleeds, she has declining status of her valve with each episode

## 2020-09-25 NOTE — Assessment & Plan Note (Signed)
History reviewed and discussed with patient, Functional Abilities assessment tool reviewed and discussed with patient, Depression Assessment tool reviewed and discussed with patient, Health Risk Assessment tool reviewed and discussed with patient, Medications reviewed including any opioid usage    Time spent on depression screening: 2 minutes      Time Spent on Cardiovascular Risk Reduction Counselling: 10 minutes

## 2020-09-25 NOTE — Assessment & Plan Note (Signed)
Chronic, stable  No recent issues

## 2020-09-27 LAB — CBC
Hematocrit: 43.3 % (ref 34.0–47.0)
Hemoglobin: 14.5 g/dL (ref 11.5–15.7)
MCH: 30.3 pg (ref 27.0–34.5)
MCHC: 33.5 g/dL (ref 32.0–36.0)
MCV: 90.6 fL (ref 81.0–99.0)
MPV: 12 fL (ref 7.2–13.2)
NRBC Absolute: 0 10*3/uL (ref 0.000–0.012)
NRBC Automated: 0 % (ref 0.0–0.2)
Platelets: 243 10*3/uL (ref 140–440)
RBC: 4.78 x10e6/mcL (ref 3.60–5.20)
RDW: 13.8 % (ref 11.0–16.0)
WBC: 7.8 10*3/uL (ref 3.8–10.6)

## 2020-09-28 LAB — COMPREHENSIVE METABOLIC PANEL
ALT: 20 U/L (ref 0–35)
AST: 23 U/L (ref 0–35)
Albumin/Globulin Ratio: 2 (ref 1.00–2.70)
Albumin: 4.5 g/dL (ref 3.5–5.2)
Alk Phosphatase: 97 U/L (ref 35–117)
Anion Gap: 16 mmol/L (ref 2–17)
BUN: 18 mg/dL (ref 8–23)
CO2: 24 mmol/L (ref 22–29)
Calcium: 9.8 mg/dL (ref 8.8–10.2)
Chloride: 99 mmol/L (ref 98–107)
Creatinine: 0.8 mg/dL (ref 0.5–1.0)
Est, Glom Filt Rate: 79 mL/min/1.73m?? — ABNORMAL LOW (ref >=90)
Globulin: 2.2 g/dL (ref 1.9–4.4)
Glucose: 107 mg/dL — ABNORMAL HIGH (ref 70–99)
OSMOLALITY CALCULATED: 280 mOsm/kg (ref 270–287)
Potassium: 4.3 mmol/L (ref 3.5–5.3)
Sodium: 139 mmol/L (ref 135–145)
Total Bilirubin: 0.85 mg/dL (ref 0.00–1.20)
Total Protein: 6.7 g/dL (ref 6.4–8.3)

## 2020-09-28 LAB — LIPID PANEL
Chol/HDL Ratio: 2.4 (ref 0.0–4.4)
Cholesterol: 164 mg/dL (ref 100–200)
HDL: 67 mg/dL (ref >=50)
LDL Cholesterol: 76.8 mg/dL (ref 0.0–100.0)
LDL/HDL Ratio: 1.1
Triglycerides: 101 mg/dL (ref 0–149)
VLDL: 20.2 mg/dL (ref 5.0–40.0)

## 2020-09-28 LAB — TSH WITH REFLEX TO FT4: TSH: 1.24 mcIU/mL (ref 0.358–3.740)

## 2020-09-28 LAB — VITAMIN D 25 HYDROXY: Vit D, 25-Hydroxy: 55.3 ng/mL (ref 30.0–90.0)

## 2020-10-03 NOTE — Other (Signed)
Jodi Powell, I reviewed your labs.  Your glucose is slightly elevated but electrolytes, kidneys and liver look great.   Cholesterol is controlled. Your thyroid, Vitamin D, and blood cell counts are all in normal ranges. We will plan to repeat labs in 1 year. Dr Suzie Portela

## 2020-12-18 ENCOUNTER — Encounter

## 2020-12-19 MED ORDER — METOPROLOL SUCCINATE ER 25 MG PO TB24
25 MG | ORAL_TABLET | ORAL | 3 refills | Status: DC
Start: 2020-12-19 — End: 2022-03-19

## 2021-01-01 MED ORDER — ROSUVASTATIN CALCIUM 5 MG PO TABS
5 MG | ORAL_TABLET | ORAL | 0 refills | Status: DC
Start: 2021-01-01 — End: 2021-03-26

## 2021-01-01 NOTE — Telephone Encounter (Signed)
Patient is requesting refill    What pharmacy would you like this sent to?  [id number]    Have there been any medication changes since your last visit? No    Medication: rosuvastatin   Dosage: 5 mg  Frequency: 1 daily  Quantity: 90

## 2021-01-01 NOTE — Telephone Encounter (Signed)
Per protocol criteria for refill reviewed and met, refill sent per physician ordered protocol.

## 2021-01-05 ENCOUNTER — Encounter

## 2021-01-05 MED ORDER — LOSARTAN POTASSIUM-HCTZ 100-25 MG PO TABS
100-25 MG | ORAL_TABLET | ORAL | 3 refills | Status: AC
Start: 2021-01-05 — End: 2021-12-31

## 2021-01-05 NOTE — Telephone Encounter (Signed)
TE to [place].  Rx ready to send.  LOV: 09/25/20  NOV: 10/02/21

## 2021-02-03 ENCOUNTER — Encounter

## 2021-02-06 MED ORDER — FLUTICASONE PROPIONATE 50 MCG/ACT NA SUSP
50 MCG/ACT | NASAL | 3 refills | Status: AC
Start: 2021-02-06 — End: 2021-03-26

## 2021-03-15 NOTE — Telephone Encounter (Signed)
Spoke with pt and informed her that she has had both PNA vaccines and that she does not need another.  She V/U.

## 2021-03-15 NOTE — Telephone Encounter (Signed)
Patient called to follow up with message sent on 03/15/18 - please advise on pneumococcal vaccination for patient.   [phone]

## 2021-03-21 ENCOUNTER — Inpatient Hospital Stay: Admit: 2021-03-21 | Payer: MEDICARE | Primary: Family Medicine

## 2021-03-21 DIAGNOSIS — Z1231 Encounter for screening mammogram for malignant neoplasm of breast: Secondary | ICD-10-CM

## 2021-03-26 ENCOUNTER — Encounter

## 2021-03-26 MED ORDER — ROSUVASTATIN CALCIUM 5 MG PO TABS
5 MG | ORAL_TABLET | ORAL | 1 refills | Status: DC
Start: 2021-03-26 — End: 2021-09-24

## 2021-03-26 MED ORDER — FLUTICASONE PROPIONATE 50 MCG/ACT NA SUSP
50 MCG/ACT | Freq: Every day | NASAL | 3 refills | Status: AC
Start: 2021-03-26 — End: ?

## 2021-04-01 ENCOUNTER — Encounter

## 2021-04-06 MED ORDER — PANTOPRAZOLE SODIUM 40 MG PO TBEC
40 MG | ORAL_TABLET | Freq: Every day | ORAL | 1 refills | Status: DC
Start: 2021-04-06 — End: 2021-09-24

## 2021-04-06 NOTE — Telephone Encounter (Signed)
Per protocol criteria for refill reviewed and met, refill sent per physician ordered protocol.

## 2021-04-06 NOTE — Telephone Encounter (Signed)
pantoprazole (PROTONIX) 40 MG tablet, twice per day, 90 day     [id number]

## 2021-06-04 DIAGNOSIS — N329 Bladder disorder, unspecified: Secondary | ICD-10-CM

## 2021-06-04 NOTE — Assessment & Plan Note (Signed)
New problem    Has had a hysterectomy

## 2021-06-05 ENCOUNTER — Ambulatory Visit: Admit: 2021-06-05 | Discharge: 2021-06-05 | Payer: MEDICARE | Attending: Family Medicine | Primary: Family Medicine

## 2021-06-05 DIAGNOSIS — N329 Bladder disorder, unspecified: Secondary | ICD-10-CM

## 2021-06-05 NOTE — Assessment & Plan Note (Signed)
Chronic, ongoing  Getting allergy shots with Allergist

## 2021-06-05 NOTE — Progress Notes (Signed)
Jodi RoughenSusan Powell (DOB:  Feb 17, 1950) is a 71 y.o. female here for evaluation of the following chief complaint(s):  Other (Pt noticed 2 weeks (got better) then this weekend, she feels something when wiping after urinating.  Some discomfort, no pain.  Started allergy shots last Aug-once weekly)    Same day- ?prolapsed bladder  PHQ2    Able to feel a buldge and push it up. No leakage or urinary frequency. Total hysterectomy.     PHQ-9  09/21/2020   Little interest or pleasure in doing things 0   Feeling down, depressed, or hopeless 0   PHQ-2 Score 0   PHQ-9 Total Score 0        Past Medical History:   Diagnosis Date    : MM-03/20/20 , DXA- , EKG-11/29/19-Dr. Pinto     Hyperlipidemia     Hypertension     Recurrent gastrointestinal hemorrhage     AV malformations    S/P aortic valve replacement with bioprosthetic valve 2011    Dr Claris GladdenPinto    Skin problem     [place]erm[place]    Thyroid nodule 12/20/2019    Right      Past Surgical History:   Procedure Laterality Date    AORTIC VALVE REPLACEMENT  06/2009    AORTIC VALVE REPLACEMENT  02/09/2020    Dr. Tenny Crawoss    APPENDECTOMY      CARDIAC CATHETERIZATION      CARDIAC CATHETERIZATION  01/2020    Dr. Aggie CosierBunting    HYSTERECTOMY (CERVIX STATUS UNKNOWN)      UPPER GASTROINTESTINAL ENDOSCOPY  12/2019    Dr. Claris GladdenPinto      Social History     Socioeconomic History    Marital status: Widowed     Spouse name: None    Number of children: None    Years of education: None    Highest education level: None   Tobacco Use    Smoking status: Former     Packs/day: 0.50     Years: 40.00     Pack years: 20.00     Types: Cigarettes     Quit date: 1991     Years since quitting: 32.3    Smokeless tobacco: Never    Tobacco comments:     Patient counselled on the dangers of tobacco 12/06/2019, Cessation: Not applicable    Vaping Use    Vaping Use: Never used   Substance and Sexual Activity    Alcohol use: Yes     Alcohol/week: 5.0 standard drinks     Types: 5 Glasses of wine per week     Comment: 5 glasses of wine  weekly    Drug use: Never     Social Determinants of Health     Physical Activity: Unknown    Days of Exercise per Week: 4 days       Current Outpatient Medications   Medication Instructions    amLODIPine (NORVASC) 5 MG tablet 1 tablet Orally Once a day for 90 days    aspirin 81 mg    Cholecalciferol (VITAMIN D) 50 MCG (2000 UT) CAPS capsule 1 capsule, Oral, DAILY    EPINEPHrine (EPIPEN) 0.3 MG/0.3ML SOAJ injection PLEASE SEE ATTACHED FOR DETAILED DIRECTIONS    fluticasone (FLONASE) 50 MCG/ACT nasal spray 1 spray, Nasal, DAILY    losartan-hydroCHLOROthiazide (HYZAAR) 100-25 MG per tablet TAKE 1 TABLET BY MOUTH EVERY DAY FOR 90 DAYS    metoprolol succinate (TOPROL XL) 25 MG extended release tablet TAKE 1 TABLET  BY MOUTH EVERY DAY FOR 90 DAYS    pantoprazole (PROTONIX) 40 mg, Oral, DAILY    rosuvastatin (CRESTOR) 5 MG tablet 1 tablet Orally Once a day for 90 days    spironolactone (ALDACTONE) 25 MG tablet 1/2 tab Orally qod        All other systems reviewed and are negative unless otherwise stated.        Objective   Vitals:    06/05/21 0814   BP: 122/78   Pulse: 66   SpO2: 97%   Weight: 158 lb 14.4 oz (72.1 kg)   Height: 5\' 3"  (1.6 m)      Physical Exam  Constitutional:       Appearance: Normal appearance.   HENT:      Right Ear: Hearing normal.      Left Ear: Hearing normal.   Pulmonary:      Effort: Pulmonary effort is normal.   Genitourinary:     General: Normal vulva.      Pubic Area: No rash.       Urethra: No prolapse, urethral pain, urethral swelling or urethral lesion.      Comments: +prolapsed vaginal tissue  Neurological:      Mental Status: She is alert.   Psychiatric:         Attention and Perception: Attention and perception normal.         Mood and Affect: Mood and affect normal.         Behavior: Behavior is cooperative.          Lab Results   Component Value Date    WBC 7.8 09/27/2020    HGB 14.5 09/27/2020    HCT 43.3 09/27/2020    MCV 90.6 09/27/2020    PLT 243 09/27/2020     Lab Results   Component  Value Date/Time    NA 139 09/27/2020 09:25 AM    K 4.3 09/27/2020 09:25 AM    CL 99 09/27/2020 09:25 AM    CO2 24 09/27/2020 09:25 AM    BUN 18 09/27/2020 09:25 AM    CREATININE 0.8 09/27/2020 09:25 AM    GLUCOSE 107 09/27/2020 09:25 AM    CALCIUM 9.8 09/27/2020 09:25 AM      Lab Results   Component Value Date    CHOL 164 09/27/2020    CHOL 166 02/23/2020    CHOL 148 11/30/2019     Lab Results   Component Value Date    TRIG 101 09/27/2020    TRIG 164 (H) 02/23/2020    TRIG 105 11/30/2019     Lab Results   Component Value Date    HDL 67 09/27/2020    HDL 59 02/23/2020    HDL 63 11/30/2019     Lab Results   Component Value Date    LDLCHOLESTEROL 76.8 09/27/2020    LDLCHOLESTEROL 74.2 02/23/2020    LDLCHOLESTEROL 64.0 11/30/2019     Lab Results   Component Value Date    VLDL 20.2 09/27/2020    VLDL 32.8 02/23/2020    VLDL 21.0 11/30/2019     Lab Results   Component Value Date    CHOLHDLRATIO 2.4 09/27/2020    CHOLHDLRATIO 2.8 02/23/2020    CHOLHDLRATIO 2.3 11/30/2019          ASSESSMENT/PLAN:  1. Bladder problem  Assessment & Plan:  New problem  Patient noticed a pelvic bulge approximately 2 weeks ago. She has been able to push it back up but it reoccurs  No discharge, bleeding, urinary frequency or incontinence  S/p hysterectomy then oophorectomy after ovarian torsion  No previous urinary/pelvic issues. She is not followed by GYN  Exam: appears to be prolapsed vaginal tissue. No urinary leakage on valsalva  She request referral for further evaluation-referral placed to Dr Ladona Ridgel with [facility]  Orders:  -     [facility] - Shelda Altes MD, Urogynecology, [address]  2. Seasonal allergies  Assessment & Plan:  Chronic, ongoing  Getting allergy shots with Allergist     3. Midline cystocele  -     [facility] - Shelda Altes MD, Urogynecology, [address] [facility] Maintenance Due   Topic Date Due    DTaP/Tdap/Td vaccine (1 - Tdap) Never done    Pneumococcal 65+ years Vaccine (3) 11/29/2015       Return for As  scheduled.         --Malena Edman, MD

## 2021-06-16 ENCOUNTER — Encounter

## 2021-06-26 ENCOUNTER — Institutional Professional Consult (permissible substitution)
Admit: 2021-06-26 | Discharge: 2021-06-26 | Payer: MEDICARE | Attending: Obstetrics & Gynecology | Primary: Family Medicine

## 2021-06-26 DIAGNOSIS — N816 Rectocele: Secondary | ICD-10-CM

## 2021-06-26 NOTE — Assessment & Plan Note (Addendum)
The diagnosis of pelvic organ prolapse and potential etiologies was also reviewed.  Taken into consideration her clinical history and examination findings recommendation was given for patient to institute appropriate limited to lifestyle behavior modifications that limit pressure in the pelvis such as avoidance of chronic Valsalva, constipation, heavy lifting, and maintenance of weight in the optimal BMI range.  Also discussed avoidance of tobacco use and other habits that may impact collagen and connective tissue.  Additional treatment for pelvic organ prolapse to include clinical observation, pelvic floor physical therapy, use of pessary, and potential surgical options was reviewed.  After counseling she elected to proceed with she elects to proceed with trial of pessary fitting.  Follow-up for pessary fitting trial

## 2021-06-26 NOTE — Assessment & Plan Note (Addendum)
Proceed with pessary fitting

## 2021-06-26 NOTE — Progress Notes (Signed)
Jodi Powell (DOB:  12-09-1950) is a 71 y.o. female, is a new patient who presents for evaluation of the following chief complaint(s):  New Patient (Pelvic organ prolapse.)      PCP: Jodi Edman, MD   Referring provider: Malena Edman, Md  [address]  [address] [place],  [place] [address]        ASSESSMENT/PLAN:  1. Rectocele  Assessment & Plan:    The diagnosis of pelvic organ prolapse and potential etiologies was also reviewed.  Taken into consideration her clinical history and examination findings recommendation was given for patient to institute appropriate limited to lifestyle behavior modifications that limit pressure in the pelvis such as avoidance of chronic Valsalva, constipation, heavy lifting, and maintenance of weight in the optimal BMI range.  Also discussed avoidance of tobacco use and other habits that may impact collagen and connective tissue.  Additional treatment for pelvic organ prolapse to include clinical observation, pelvic floor physical therapy, use of pessary, and potential surgical options was reviewed.  After counseling she elected to proceed with she elects to proceed with trial of pessary fitting.  Follow-up for pessary fitting trial   2. Cystocele with rectocele  Assessment & Plan:  Proceed with pessary fitting       Return in about 2 weeks (around 07/10/2021) for Pessary management.       Subjective   SUBJECTIVE/OBJECTIVE:    HPI  [name] presents for evaluation of prolapse.  She reports onset of her symptoms 4 weeks ago.  She is noted vaginal pressure and palpable bulge.  Denies pain or bleeding.  Denies voiding symptoms or disruption in force of stream.  And further denies constipation or anal incontinence.  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    She reports that her prolapse significantly impacts her quality of life and physical activities.  Her goals at this time include confirm diagnosis and discuss treatment  options.  The following is a summary of her urogynecologic symptoms assessment and examination:    Prolapse/Bulge Symptoms:yes -   Previous treatment for prolapse symptoms: no    Urinary Symptoms:  Stress incontinence: no  Urgency: no  Urge incontinence: no  Frequency: going every 2-3  hours during the day   Nocturia (>2 voids per night): no  Pad/Liner/Brief use:0  Recurrent UTI (>2 UTI / 6 months or > 3 UTI / 1 year)? no  Pain with urination: no  Gross hematuria: no  History of kidney stones: no  Post void dribbling: no  Previous treatment for urinary symptoms: no  Additional information about urinary symptoms:     Bowel Symptoms:  Patient has a bowel movement every day.  Constipation: no  Diarrhea: no  Accidental bowel leakage: no  Splinting: no  Previous treatment for bowel symptoms: no  Additional information about bowel symptoms: None reported    Sexual History:  Sexual Activity: no     Dyspareunia: no  Do you avoid sexual intercourse because of your diagnosis or symptoms: no  Do you leak urine or stool during sexual activity?no  Does fear of leakage (either stool or urine) restrict your sexual activity?no    Obstetrics/Gynecologic History:  G0P0  History of vaginal delivery? 0  Menopause: yes - .   History of postmenopausal bleeding no.  Last pap smear: 30 years ago   History of abnormal PAP smears? no   Past Medical History:  Past Medical History:  Diagnosis Date    : MM-03/20/20 , DXA- , EKG-11/29/19-Dr. Pinto     Allergies     cats, mold, mildew, dust    Chronic cough     Hyperlipidemia     Hypertension     Recurrent gastrointestinal hemorrhage     AV malformations    S/P aortic valve replacement with bioprosthetic valve 2011    Dr Jodi Powell       02/09/2020 aortic valve replacement TAVR    Skin problem     Derm: Dr Jodi Powell    Thyroid nodule 12/20/2019    Right        Surgical History:  Past Surgical History:   Procedure Laterality Date    AORTIC VALVE REPLACEMENT  06/2009    AORTIC VALVE REPLACEMENT   02/09/2020    Dr. Tenny Powell    APPENDECTOMY      CARDIAC CATHETERIZATION      CARDIAC CATHETERIZATION  01/2020    Dr. Aggie Powell    HYSTERECTOMY (CERVIX STATUS UNKNOWN)      TAH AND BSO (CERVIX REMOVED)      oopherectomy due to ovarian torsion.    UPPER GASTROINTESTINAL ENDOSCOPY  12/2019    Dr. [facility]        Family History of:  Family History   Problem Relation Age of Onset    Elevated Lipids Mother     Coronary Art Dis Mother     Hypertension Mother     Breast Cancer Mother     Hypertension Father     Esophageal Cancer Father     Coronary Art Dis Father     Elevated Lipids Father     Hypertension Sister     Elevated Lipids Sister     Elevated Lipids Brother     Hypertension Brother         Current Outpatient Medications on file  Current Outpatient Medications   Medication Sig Dispense Refill    pantoprazole (PROTONIX) 40 MG tablet Take 1 tablet by mouth daily 90 tablet 1    rosuvastatin (CRESTOR) 5 MG tablet 1 tablet Orally Once a day for 90 days 90 tablet 1    fluticasone (FLONASE) 50 MCG/ACT nasal spray 1 spray by Nasal route daily 1 each 3    losartan-hydroCHLOROthiazide (HYZAAR) 100-25 MG per tablet TAKE 1 TABLET BY MOUTH EVERY DAY FOR 90 DAYS 90 tablet 3    metoprolol succinate (TOPROL XL) 25 MG extended release tablet TAKE 1 TABLET BY MOUTH EVERY DAY FOR 90 DAYS 90 tablet 3    aspirin 81 MG EC tablet 1 tablet      EPINEPHrine (EPIPEN) 0.3 MG/0.3ML SOAJ injection PLEASE SEE ATTACHED FOR DETAILED DIRECTIONS      Cholecalciferol (VITAMIN D) 50 MCG (2000 UT) CAPS capsule Take 1 capsule by mouth daily      amLODIPine (NORVASC) 5 MG tablet 1 tablet Orally Once a day for 90 days      spironolactone (ALDACTONE) 25 MG tablet 1/2 tab Orally qod       No current facility-administered medications for this visit.        Allergies:  Allergies   Allergen Reactions    Latex Rash    Sulfa Antibiotics Headaches    Adhesive Tape Rash    Sulfur Rash        Social History:  Social History     Socioeconomic History    Marital status:  Widowed     Spouse name: Not on file    Number  of children: Not on file    Years of education: Not on file    Highest education level: Not on file   Occupational History    Not on file   Tobacco Use    Smoking status: Former     Packs/day: 0.50     Years: 40.00     Pack years: 20.00     Types: Cigarettes     Quit date: 201991     Years since quitting: 32.4    Smokeless tobacco: Never    Tobacco comments:     Patient counselled on the dangers of tobacco 12/06/2019, Cessation: Not applicable    Vaping Use    Vaping Use: Never used   Substance and Sexual Activity    Alcohol use: Yes     Alcohol/week: 5.0 standard drinks     Types: 5 Glasses of wine per week     Comment: 5 glasses of wine weekly    Drug use: Never    Sexual activity: Not Currently   Other Topics Concern    Not on file   Social History Narrative    Not on file     Social Determinants of Health     Financial Resource Strain: Not on file   Food Insecurity: Not on file   Transportation Needs: Not on file   Physical Activity: Unknown    Days of Exercise per Week: 4 days    Minutes of Exercise per Session: Not on file   Stress: Not on file   Social Connections: Not on file   Intimate Partner Violence: Not on file   Housing Stability: Not on file        Review of Systems   Genitourinary:         Prolapse   Allergic/Immunologic: Positive for environmental allergies.   Hematological:  Bruises/bleeds easily.   All other systems reviewed and are negative.       Objective     PHYSICAL EXAM  Patient gave verbal consent and chaperone present for physical exam.       BP (!) 157/89   Ht 5' 3.5" (1.613 m)   Wt 160 lb 14.4 oz (73 kg)   BMI 28.06 kg/m      General: No acute distress, pleasant affect  Head/Ears/Nose/Throat: Normocephalic, atraumatic  Skin: No obvious lesions, rashes, or bruises  Lungs: non-labored respirations  Cardiovascular: Warm and well perfused  Neurological: alert and responsive, able to ambulate and follow commands   Abdomen: soft, non-tender, no  masses, normal bowel sounds     Pelvic Examination:  Vulva: Normal vulva: no lesions, masses, epithelial changes, or exudate Sensation: within normal limits  Vagina: Atrophic:  mild  Cervix: absent cervix  Urethra: {Normal with no masses, tenderness or scarring    Bimanual:Normal  Pelvic floor muscle contraction strength (Kegel): 1     Rectal Examination:  Rectal: not done  PB intact yes -     Myofascial Pelvic Pain Assessment:  Levator ani: No  Obturator internus: No  Periurethral pain: No    POP-Q Examination:  Aa -2 Ba -2 C -7   GH  4 PB   4 TVL 9   Ap0 Bp 0 D -     Procedures/Test:  No results found for this visit on 06/26/21.    Simple Cystometry: no  PVR -  Cough stress test: Negative    ASSESSMENT/PLAN:  1. Rectocele  Assessment & Plan:    The diagnosis of pelvic organ prolapse  and potential etiologies was also reviewed.  Taken into consideration her clinical history and examination findings recommendation was given for patient to institute appropriate limited to lifestyle behavior modifications that limit pressure in the pelvis such as avoidance of chronic Valsalva, constipation, heavy lifting, and maintenance of weight in the optimal BMI range.  Also discussed avoidance of tobacco use and other habits that may impact collagen and connective tissue.  Additional treatment for pelvic organ prolapse to include clinical observation, pelvic floor physical therapy, use of pessary, and potential surgical options was reviewed.  After counseling she elected to proceed with she elects to proceed with trial of pessary fitting.  Follow-up for pessary fitting trial   2. Cystocele with rectocele  Assessment & Plan:  Proceed with pessary fitting       Return in about 2 weeks (around 07/10/2021) for Pessary management.         On this date 06/26/2021 I have spent 45 minutes reviewing previous notes, test results and face to face with the patient discussing the diagnosis and importance of compliance with the treatment plan as  well as documenting on the day of the visit.      An electronic signature was used to authenticate this note.    --Shelda Altes, MD

## 2021-07-03 ENCOUNTER — Ambulatory Visit
Admit: 2021-07-03 | Discharge: 2021-07-03 | Payer: MEDICARE | Attending: Obstetrics & Gynecology | Primary: Family Medicine

## 2021-07-03 DIAGNOSIS — N8110 Cystocele, unspecified: Secondary | ICD-10-CM

## 2021-07-03 NOTE — Assessment & Plan Note (Signed)
Reviewed clinical examination and history.  Proceed with preoperative health assessment to include cardiology evaluation  Plan scheduled for anterior and posterior pair  Follow-up for preop counseling

## 2021-07-03 NOTE — Progress Notes (Signed)
Jodi Powell (DOB:  1950/06/06) is a 71 y.o. female. Established patient here for evaluation of the following chief complaint(s): Discuss surgery rather than have a pessary.       PCP: Malena Edman, MD   Referring provider: No referring provider defined for this encounter.     ASSESSMENT/PLAN:  1. Cystocele with rectocele  Assessment & Plan:  Reviewed clinical examination and history.  Proceed with preoperative health assessment to include cardiology evaluation  Plan scheduled for anterior and posterior pair  Follow-up for preop counseling      Return Awaiting surgery date, for Surgical preop visit.    SUBJECTIVE/OBJECTIVE:  HPI  Jodi Powell presents for a follow up visit for pelvic organ prolapse. She was last seen on 06/26/2021 and reports her prolapse symptoms are not changed.   We discussed Susanhistory and physical exam findings.  The diagnosis of pelvic organ prolapse and potential etiologies was also reviewed.  Taken into consideration her clinical history and examination findings recommendation was given for patient to institute appropriate limited to lifestyle behavior modifications that limit pressure in the pelvis such as avoidance of chronic Valsalva, constipation, heavy lifting, and maintenance of weight in the optimal BMI range.  Also discussed avoidance of tobacco use and other habits that may impact collagen and connective tissue.  Additional treatment for pelvic organ prolapse to include clinical observation, pelvic floor physical therapy, use of pessary, and potential surgical options was reviewed.  After counseling she elected to proceed with evaluation for surgery.  She declines pessary today.  Discussed need for preoperative health assessment specifically cardiology evaluation.  Based on examination she would be a candidate for transvaginal anterior and posterior repair to correct the prolapse.  Anticipate same-day discharge and if indicated overnight observation based on comorbidities.  Her goals for  this visit include to treat her prolapse and alleviate her symptoms.  Past Medical History:  Past Medical History        Past Medical History:   Diagnosis Date    : MM-03/20/20 , DXA- , EKG-11/29/19-Dr. Pinto      Allergies       cats, mold, mildew, dust    Chronic cough      Hyperlipidemia      Hypertension      Recurrent gastrointestinal hemorrhage       AV malformations    S/P aortic valve replacement with bioprosthetic valve 2011     Dr Claris Gladden       02/09/2020 aortic valve replacement TAVR    Skin problem       Derm: Dr Marcell Barlow    Thyroid nodule 12/20/2019     Right            Surgical History:  Past Surgical History         Past Surgical History:   Procedure Laterality Date    AORTIC VALVE REPLACEMENT   06/2009    AORTIC VALVE REPLACEMENT   02/09/2020     Dr. Tenny Craw    APPENDECTOMY        CARDIAC CATHETERIZATION        CARDIAC CATHETERIZATION   01/2020     Dr. Aggie Cosier    HYSTERECTOMY (CERVIX STATUS UNKNOWN)        TAH AND BSO (CERVIX REMOVED)         oopherectomy due to ovarian torsion.    UPPER GASTROINTESTINAL ENDOSCOPY   12/2019     Dr. Claris Gladden  Family History of:  Family History         Family History   Problem Relation Age of Onset    Elevated Lipids Mother      Coronary Art Dis Mother      Hypertension Mother      Breast Cancer Mother      Hypertension Father      Esophageal Cancer Father      Coronary Art Dis Father      Elevated Lipids Father      Hypertension Sister      Elevated Lipids Sister      Elevated Lipids Brother      Hypertension Brother              Current Outpatient Medications on file  Current Facility-Administered Medications          Current Outpatient Medications   Medication Sig Dispense Refill    pantoprazole (PROTONIX) 40 MG tablet Take 1 tablet by mouth daily 90 tablet 1    rosuvastatin (CRESTOR) 5 MG tablet 1 tablet Orally Once a day for 90 days 90 tablet 1    fluticasone (FLONASE) 50 MCG/ACT nasal spray 1 spray by Nasal route daily 1 each 3    losartan-hydroCHLOROthiazide  (HYZAAR) 100-25 MG per tablet TAKE 1 TABLET BY MOUTH EVERY DAY FOR 90 DAYS 90 tablet 3    metoprolol succinate (TOPROL XL) 25 MG extended release tablet TAKE 1 TABLET BY MOUTH EVERY DAY FOR 90 DAYS 90 tablet 3    aspirin 81 MG EC tablet 1 tablet        EPINEPHrine (EPIPEN) 0.3 MG/0.3ML SOAJ injection PLEASE SEE ATTACHED FOR DETAILED DIRECTIONS        Cholecalciferol (VITAMIN D) 50 MCG (2000 UT) CAPS capsule Take 1 capsule by mouth daily        amLODIPine (NORVASC) 5 MG tablet 1 tablet Orally Once a day for 90 days        spironolactone (ALDACTONE) 25 MG tablet 1/2 tab Orally qod          No current facility-administered medications for this visit.            Allergies:       Allergies   Allergen Reactions    Latex Rash    Sulfa Antibiotics Headaches    Adhesive Tape Rash    Sulfur Rash         Social History:  Social History               Socioeconomic History    Marital status: Widowed       Spouse name: Not on file    Number of children: Not on file    Years of education: Not on file    Highest education level: Not on file   Occupational History    Not on file   Tobacco Use    Smoking status: Former       Packs/day: 0.50       Years: 40.00       Pack years: 20.00       Types: Cigarettes       Quit date: 91991       Years since quitting: 32.4    Smokeless tobacco: Never    Tobacco comments:       Patient counselled on the dangers of tobacco 12/06/2019, Cessation: Not applicable    Vaping Use    Vaping Use: Never used   Substance and Sexual Activity  Alcohol use: Yes       Alcohol/week: 5.0 standard drinks       Types: 5 Glasses of wine per week       Comment: 5 glasses of wine weekly    Drug use: Never    Sexual activity: Not Currently   Other Topics Concern    Not on file   Social History Narrative    Not on file      Social Determinants of Health          Financial Resource Strain: Not on file   Food Insecurity: Not on file   Transportation Needs: Not on file   Physical Activity: Unknown    Days of Exercise per  Week: 4 days    Minutes of Exercise per Session: Not on file   Stress: Not on file   Social Connections: Not on file   Intimate Partner Violence: Not on file   Housing Stability: Not on file            Review of Systems   Genitourinary:         Prolapse   Allergic/Immunologic: Positive for environmental allergies.   Hematological:  Bruises/bleeds easily.   All other systems reviewed and are negative.        On this date 07/03/2021 I have spent 25 minutes reviewing previous notes, test results and face to face with the patient discussing the diagnosis and importance of compliance with the treatment plan as well as documenting on the day of the visit.      An electronic signature was used to authenticate this note.    --Shelda Altes, MD

## 2021-07-09 ENCOUNTER — Encounter

## 2021-07-10 MED ORDER — FLUTICASONE PROPIONATE 50 MCG/ACT NA SUSP
50 MCG/ACT | Freq: Every day | NASAL | 3 refills | Status: DC
Start: 2021-07-10 — End: 2021-10-02

## 2021-07-10 NOTE — Telephone Encounter (Signed)
Per protocol criteria for refill reviewed and met, refill sent per physician ordered protocol.

## 2021-07-25 ENCOUNTER — Encounter

## 2021-08-23 ENCOUNTER — Encounter

## 2021-08-23 MED ORDER — AMLODIPINE BESYLATE 5 MG PO TABS
5 MG | ORAL_TABLET | ORAL | 3 refills | Status: AC
Start: 2021-08-23 — End: 2022-08-03

## 2021-08-23 NOTE — Telephone Encounter (Signed)
Per protocol criteria not met. Labs and/or OV > 6 months. Sent to practice for provider discretion.

## 2021-08-23 NOTE — Telephone Encounter (Signed)
[facility] 09/25/20 with 1 yr f/u scheduled for   10/02/21.  Labs ordered.  Refill ready to send

## 2021-08-23 NOTE — Telephone Encounter (Signed)
patient is requesting a refill of medication below     amLODIPine (NORVASC) 5 MG tablet   1 tablet Orally Once a day for 90 days    [place] 0905  LV 0509    pharmacy is verified

## 2021-08-24 NOTE — Telephone Encounter (Signed)
Pt called wanting to know she has surgery scheduled for 09/03/2021, wanted to know did her insurance company approve it.

## 2021-08-27 NOTE — Progress Notes (Signed)
Pre Procedure Patient Instructions    Procedure Location hospital:[facility]: [address] [place] - Drop off at Outpatient Services to the left of the main hospital entrance and proceed to the gold elevator on your left (past the security desk) to the 2nd floor Surgery Reception desk.   Procedure Date 09/03/2021  Arrival Time  1000      Medications:  Medication to be taken the morning of surgery with a few sips of water only: ROSUVASTATIN, METOPROLOL, AMLODIPINE, PANTOPRAZOLE, FLONASE NASAL SPRAY  Hold or reduce the dosage of the following medication, as discussed: HOLD ASPIRIN AS INSTRUCTED BY YOUR MD  HOLD LOSARTAN, SPIRONOLACTONE THE MORNING OF SURGERY  Stop all supplements, vitamins and herbal remedies one week prior to your procedure, unless your doctor told you to continue taking.  Do not take over the counter pain medications except plain Tylenol or Acetaminophen unless your doctor told you to do so.  If you are taking blood thinners, call the doctor performing your procedure and prescribing physician for instructions on when to stop.    Procedure Preparation    Diet Restrictions:No food or drink including gum or mints -after midnight    Skin Preparation:   Wash with Hibiclens or an antibacterial soap (e.g. Dial soap) the night before and morning of procedure., Using a clean washcloth, wash from neck to toes for 3 minutes. Gently clean the area where your surgery will be done thoroughly., and Do not use Hibiclens on or near your face, eyes, ears or head.  Do not put on any deodorants, lotions, powders, or oils afterwards.  Be sure to put on clean clothes    Other Preparation:  Call your surgeon right away if you get any wounds, cuts, scrapes, scabs, rashes, bug bites at or near your operative site or if you have any fever, cold or flu symptoms.    Complete tests for surgery by 08/28/2021 at [facility]; [address], [place]; M-F 7a-4:30p; Sat-Sun; 7a-12p; Enter Outpatient  Services next to Emergency Dept entrance; You do not need to fast. Bring your picture ID and insurance card. Inform the receptionist you are there for testing before your surgery. Tell them the name of your doctor. If you have any issue, call [phone]      Day of Procedure Patient Instruction:  Do not smoke, vape, chew tobacco, drink alcohol or use recreational drugs on the day of your procedure  Remove all jewelry, piercings, and metal accessories  Do not wear artificial nails and only clear nail polish on natural nails. Nails must be trimmed to fingertip length to make sure the oxygen probe fits properly and to avoid injury  Do not wear artificial eye lashes because they can cause dry eyes, eye scratches, eye infections and allergic reactions  Do not use hair extensions with metal clips or hairstyles near the back of your neck, as it can make it difficult to safely manage your breathing during anesthesia  If your hair is tightly braided or styled in a complex way, it may need to be undone for your safety  Do not wear contacts, tampons, make-up, lotions, creams, powders, fragrances or deodorant  Do not bring valuables or money  Bring a copy of your Living Will and/or Medical Durable Power of Attorney if you have one  Bring a list of current medications including name and dosage  Bring a picture ID and insurance card and any of the following that are applicable to you:  Inhalers      CPAP or BiPAP machine     Remote for spinal cord stimulator or other implanted device     Insulin pump supplies     Walker or other orthopedic device necessary for postop     Storage case for eyeglasses, hearing aids, dentures, etc     A loose button-up shirt if instructed          .  If you are going home the same day as your procedure, a support person should accompany you to the facility and must transport you home.  If you plan to take public transportation of any sort, your support person must accompany you home.  You  will need someone to stay with you for 24 hours after your procedure with sedation of any kind.         No COVID testing required as discussed.       Comments:     The information and visitor policy was reviewed with you during your Pre-Admission Testing interview and you verbalized understanding. If you have any additional questions please contact [phone]    For financial questions regarding your procedure at a [facility] facility, please contact [phone], option 1    For financial questions regarding anesthesia at a [facility] facility, please  contact [phone]

## 2021-08-28 ENCOUNTER — Telehealth
Admit: 2021-08-28 | Discharge: 2021-08-28 | Payer: MEDICARE | Attending: Obstetrics & Gynecology | Primary: Family Medicine

## 2021-08-28 ENCOUNTER — Inpatient Hospital Stay: Admit: 2021-08-28 | Payer: MEDICARE | Primary: Family Medicine

## 2021-08-28 DIAGNOSIS — N816 Rectocele: Secondary | ICD-10-CM

## 2021-08-28 DIAGNOSIS — Z79899 Other long term (current) drug therapy: Secondary | ICD-10-CM

## 2021-08-28 DIAGNOSIS — Z5181 Encounter for therapeutic drug level monitoring: Secondary | ICD-10-CM

## 2021-08-28 LAB — POTASSIUM: Potassium: 4.2 mmol/L (ref 3.5–5.3)

## 2021-08-28 MED ORDER — TRAMADOL HCL 50 MG PO TABS
50 MG | ORAL_TABLET | Freq: Four times a day (QID) | ORAL | 0 refills | Status: AC | PRN
Start: 2021-08-28 — End: 2021-08-31

## 2021-08-28 NOTE — Assessment & Plan Note (Signed)
09/03/2021 plan anterior posterior repair  Outpatient procedure  Location: [facility]  Procedure time: Tentative 12 PM  Allergies and medications reviewed  Postop analgesia prescription for tramadol 50 mg #15 tablets with no refills forwarded to pharmacy

## 2021-08-28 NOTE — Progress Notes (Signed)
Jodi Powell (DOB:  09-14-1950) is a 71 y.o. female,Established patient, here for evaluation of the following chief complaint(s):  Follow-up (Prolapse)      PCP: Malena Edman, MD   Referring provider: No referring provider defined for this encounter.     ASSESSMENT/PLAN:  1. Rectocele  2. Cystocele with rectocele  Assessment & Plan:   09/03/2021 plan anterior posterior repair  Outpatient procedure  Location: [facility]  Procedure time: Tentative 12 PM  Allergies and medications reviewed  Postop analgesia prescription for tramadol 50 mg #15 tablets with no refills forwarded to pharmacy  3. Postoperative pain  -     traMADol (ULTRAM) 50 MG tablet; Take 1 tablet by mouth every 6 hours as needed for Pain for up to 3 days. Intended supply: 3 days. Take lowest dose possible to manage pain Max Daily Amount: 200 mg, Disp-15 tablet, R-0Normal    Return in about 2 weeks (around 09/11/2021) for Surgical postop visit.  Jodi Powell was evaluated through a synchronous (real-time) audio-video encounter. The patient (or guardian if applicable) is aware that this is a billable service, which includes applicable co-pays. This Virtual Visit was conducted with patient's (and/or legal guardian's) consent.  Patient identification was verified, and a caregiver was present when appropriate.   The patient was located at Home: [address]  [address] [place] [address] [place] [address]  Provider was located at [employer] [facility] (Appt Dept): [address]  [place] [address]  SUBJECTIVE/OBJECTIVE:  Jodi Powell presents for a preoperative visit.  Date of surgery:  Planned procedure:  Interim history:  She was last seen on 07/03/2021 and reports her symptoms are not changed.  The planned procedure was discussed and reviewed in context of her clinical history and symptoms.  Both surgical and nonsurgical options were discussed.  Her goals for this visit include proceed with surgery for prolapse.    Procedure consent:   Her history and  examination are reviewed.  Treatment options were discussed to include nonsurgical and surgical intervention.  She is elected to proceed with surgical management for definitive result.  The goals and intent of surgery are to alleviate symptoms and improved pelvic floor function. The surgical risks while rare were reviewed and included but were not not limited to injury to the bowel, bladder, ureter, internal organs; possibility of nerve entrapment and nerve injury; possibility of bleeding with subsequent transfusion were all discussed with the patient at a great length. She was also counseled on the potential for unintended short-term and/or long-term voiding dysfunction, defecatory dysfunction,  dyspareunia, recurrent prolapse, recurrent incontinence, as well as the rest of the events despite preventative measures to include DVT, PE, and death.     Past Medical History           Past Medical History:   Diagnosis Date    : MM-03/20/20 , DXA- , EKG-11/29/19-Dr. Pinto      Allergies       cats, mold, mildew, dust    Chronic cough      Hyperlipidemia      Hypertension      Recurrent gastrointestinal hemorrhage       AV malformations    S/P aortic valve replacement with bioprosthetic valve 2011     Dr Claris Gladden       02/09/2020 aortic valve replacement TAVR    Skin problem       Derm: Dr Marcell Barlow    Thyroid nodule 12/20/2019  Right            Surgical History:  Past Surgical History             Past Surgical History:   Procedure Laterality Date    AORTIC VALVE REPLACEMENT   06/2009    AORTIC VALVE REPLACEMENT   02/09/2020     Dr. Tenny Craw    APPENDECTOMY        CARDIAC CATHETERIZATION        CARDIAC CATHETERIZATION   01/2020     Dr. Aggie Cosier    HYSTERECTOMY (CERVIX STATUS UNKNOWN)        TAH AND BSO (CERVIX REMOVED)         oopherectomy due to ovarian torsion.    UPPER GASTROINTESTINAL ENDOSCOPY   12/2019     Dr. Claris Gladden            Family History of:  Family History             Family History   Problem Relation Age of Onset     Elevated Lipids Mother      Coronary Art Dis Mother      Hypertension Mother      Breast Cancer Mother      Hypertension Father      Esophageal Cancer Father      Coronary Art Dis Father      Elevated Lipids Father      Hypertension Sister      Elevated Lipids Sister      Elevated Lipids Brother      Hypertension Brother              Current Outpatient Medications on file  Current Facility-Administered Medications               Current Outpatient Medications   Medication Sig Dispense Refill    pantoprazole (PROTONIX) 40 MG tablet Take 1 tablet by mouth daily 90 tablet 1    rosuvastatin (CRESTOR) 5 MG tablet 1 tablet Orally Once a day for 90 days 90 tablet 1    fluticasone (FLONASE) 50 MCG/ACT nasal spray 1 spray by Nasal route daily 1 each 3    losartan-hydroCHLOROthiazide (HYZAAR) 100-25 MG per tablet TAKE 1 TABLET BY MOUTH EVERY DAY FOR 90 DAYS 90 tablet 3    metoprolol succinate (TOPROL XL) 25 MG extended release tablet TAKE 1 TABLET BY MOUTH EVERY DAY FOR 90 DAYS 90 tablet 3    aspirin 81 MG EC tablet 1 tablet        EPINEPHrine (EPIPEN) 0.3 MG/0.3ML SOAJ injection PLEASE SEE ATTACHED FOR DETAILED DIRECTIONS        Cholecalciferol (VITAMIN D) 50 MCG (2000 UT) CAPS capsule Take 1 capsule by mouth daily        amLODIPine (NORVASC) 5 MG tablet 1 tablet Orally Once a day for 90 days        spironolactone (ALDACTONE) 25 MG tablet 1/2 tab Orally qod          No current facility-administered medications for this visit.            Allergies:          Allergies   Allergen Reactions    Latex Rash    Sulfa Antibiotics Headaches    Adhesive Tape Rash    Sulfur Rash         Social History:  Social History  Socioeconomic History    Marital status: Widowed       Spouse name: Not on file    Number of children: Not on file    Years of education: Not on file    Highest education level: Not on file   Occupational History    Not on file   Tobacco Use    Smoking status: Former       Packs/day: 0.50       Years:  40.00       Pack years: 20.00       Types: Cigarettes       Quit date: 49       Years since quitting: 32.4    Smokeless tobacco: Never    Tobacco comments:       Patient counselled on the dangers of tobacco 12/06/2019, Cessation: Not applicable    Vaping Use    Vaping Use: Never used   Substance and Sexual Activity    Alcohol use: Yes       Alcohol/week: 5.0 standard drinks       Types: 5 Glasses of wine per week       Comment: 5 glasses of wine weekly    Drug use: Never    Sexual activity: Not Currently   Other Topics Concern    Not on file   Social History Narrative    Not on file      Social Determinants of Health            Financial Resource Strain: Not on file   Food Insecurity: Not on file   Transportation Needs: Not on file   Physical Activity: Unknown    Days of Exercise per Week: 4 days    Minutes of Exercise per Session: Not on file   Stress: Not on file   Social Connections: Not on file   Intimate Partner Violence: Not on file   Housing Stability: Not on file            Review of Systems   Genitourinary:         Prolapse   Allergic/Immunologic: Positive for environmental allergies.   Hematological:  Bruises/bleeds easily.   All other systems reviewed and are negative.    There were no vitals taken for this visit.   Physical Exam  General: No acute distress, pleasant affect  Head/Ears/Nose/Throat: Normocephalic, atraumatic  Skin: No obvious lesions, rashes, or bruises  Lungs: non-labored respirations  Cardiovascular: Warm and well perfused  Neurological: alert and responsive, able to ambulate and follow commands   Abdomen: soft, non-tender, no masses, normal bowel sounds      Pelvic Examination:  Vulva: Normal vulva: no lesions, masses, epithelial changes, or exudate Sensation: within normal limits  Vagina: Atrophic:  mild  Cervix: absent cervix  Urethra: {Normal with no masses, tenderness or scarring     Bimanual:Normal  Pelvic floor muscle contraction strength (Kegel): 1      Rectal  Examination:  Rectal: not done  PB intact yes -      Myofascial Pelvic Pain Assessment:  Levator ani: No  Obturator internus: No  Periurethral pain: No     POP-Q Examination:  Aa -2 Ba -2 C -7   GH  4 PB   4 TVL 9   Ap0 Bp 0 D -      Labs/Procedures:  No results found for this visit on 08/28/21.    On this date 08/28/2021 I have spent 15 minutes reviewing previous notes,  test results and face to face with the patient discussing the diagnosis and importance of compliance with the treatment plan as well as documenting on the day of the visit.      An electronic signature was used to authenticate this note.    --Shelda Altes, MD

## 2021-09-03 ENCOUNTER — Inpatient Hospital Stay: Payer: Medicare (Managed Care) | Attending: Obstetrics & Gynecology

## 2021-09-03 MED ORDER — DEXAMETHASONE SODIUM PHOSPHATE 4 MG/ML IJ SOLN
4 MG/ML | INTRAMUSCULAR | Status: DC | PRN
Start: 2021-09-03 — End: 2021-09-03
  Administered 2021-09-03: 17:00:00 4 via INTRAVENOUS

## 2021-09-03 MED ORDER — LIDOCAINE HCL (PF) 1 % IJ SOLN
1 % | Freq: Once | INTRAMUSCULAR | Status: DC | PRN
Start: 2021-09-03 — End: 2021-09-03

## 2021-09-03 MED ORDER — LIDOCAINE HCL (PF) 2 % IJ SOLN
2 % | INTRAMUSCULAR | Status: AC
Start: 2021-09-03 — End: ?

## 2021-09-03 MED ORDER — HYDRALAZINE HCL 20 MG/ML IJ SOLN
20 MG/ML | INTRAMUSCULAR | Status: DC | PRN
Start: 2021-09-03 — End: 2021-09-03

## 2021-09-03 MED ORDER — BUPIVACAINE-EPINEPHRINE 0.5% -1:200000 IJ SOLN
INTRAMUSCULAR | Status: DC | PRN
Start: 2021-09-03 — End: 2021-09-03
  Administered 2021-09-03: 17:00:00 17.5 via INTRADERMAL

## 2021-09-03 MED ORDER — NORMAL SALINE FLUSH 0.9 % IV SOLN
0.9 % | Freq: Two times a day (BID) | INTRAVENOUS | Status: DC
Start: 2021-09-03 — End: 2021-09-03

## 2021-09-03 MED ORDER — DROPERIDOL 2.5 MG/ML IJ SOLN
2.5 MG/ML | Freq: Once | INTRAMUSCULAR | Status: DC | PRN
Start: 2021-09-03 — End: 2021-09-03

## 2021-09-03 MED ORDER — NORMAL SALINE FLUSH 0.9 % IV SOLN
0.9 % | INTRAVENOUS | Status: DC | PRN
Start: 2021-09-03 — End: 2021-09-03
  Administered 2021-09-03: 17:00:00 17.5 via INTRADERMAL

## 2021-09-03 MED ORDER — MIDAZOLAM HCL 2 MG/2ML IJ SOLN
2 MG/ML | INTRAMUSCULAR | Status: DC | PRN
Start: 2021-09-03 — End: 2021-09-03
  Administered 2021-09-03: 16:00:00 2 via INTRAVENOUS

## 2021-09-03 MED ORDER — KETOROLAC TROMETHAMINE 30 MG/ML IJ SOLN
30 MG/ML | INTRAMUSCULAR | Status: DC | PRN
Start: 2021-09-03 — End: 2021-09-03
  Administered 2021-09-03: 17:00:00 30 via INTRAVENOUS

## 2021-09-03 MED ORDER — KETOROLAC TROMETHAMINE 30 MG/ML IJ SOLN
30 MG/ML | INTRAMUSCULAR | Status: AC
Start: 2021-09-03 — End: ?

## 2021-09-03 MED ORDER — SODIUM CHLORIDE 0.9 % IV SOLN
0.9 % | INTRAVENOUS | Status: DC | PRN
Start: 2021-09-03 — End: 2021-09-03

## 2021-09-03 MED ORDER — DEXTROSE IN LACTATED RINGERS 5 % IV SOLN
5 % | INTRAVENOUS | Status: DC
Start: 2021-09-03 — End: 2021-09-03

## 2021-09-03 MED ORDER — HYDROMORPHONE HCL 1 MG/ML IJ SOLN
1 MG/ML | INTRAMUSCULAR | Status: DC | PRN
Start: 2021-09-03 — End: 2021-09-03

## 2021-09-03 MED ORDER — DEXAMETHASONE SODIUM PHOSPHATE 4 MG/ML IJ SOLN
4 MG/ML | INTRAMUSCULAR | Status: AC
Start: 2021-09-03 — End: ?

## 2021-09-03 MED ORDER — DIPHENHYDRAMINE HCL 50 MG/ML IJ SOLN
50 MG/ML | Freq: Once | INTRAMUSCULAR | Status: DC | PRN
Start: 2021-09-03 — End: 2021-09-03

## 2021-09-03 MED ORDER — ONDANSETRON HCL 4 MG/2ML IJ SOLN
4 MG/2ML | INTRAMUSCULAR | Status: AC
Start: 2021-09-03 — End: ?

## 2021-09-03 MED ORDER — NORMAL SALINE FLUSH 0.9 % IV SOLN
0.9 % | INTRAVENOUS | Status: DC | PRN
Start: 2021-09-03 — End: 2021-09-03

## 2021-09-03 MED ORDER — LABETALOL HCL 5 MG/ML IV SOLN
5 MG/ML | INTRAVENOUS | Status: DC | PRN
Start: 2021-09-03 — End: 2021-09-03

## 2021-09-03 MED ORDER — EPHEDRINE SULFATE (PRESSORS) 50 MG/ML IV SOLN
50 MG/ML | INTRAVENOUS | Status: DC | PRN
Start: 2021-09-03 — End: 2021-09-03
  Administered 2021-09-03: 17:00:00 10 via INTRAVENOUS
  Administered 2021-09-03 (×3): 5 via INTRAVENOUS

## 2021-09-03 MED ORDER — MIDAZOLAM HCL 2 MG/2ML IJ SOLN
2 MG/ML | INTRAMUSCULAR | Status: AC
Start: 2021-09-03 — End: ?

## 2021-09-03 MED ORDER — FENTANYL CITRATE (PF) 100 MCG/2ML IJ SOLN
100 MCG/2ML | INTRAMUSCULAR | Status: DC | PRN
Start: 2021-09-03 — End: 2021-09-03
  Administered 2021-09-03 (×2): 25 via INTRAVENOUS
  Administered 2021-09-03: 16:00:00 50 via INTRAVENOUS

## 2021-09-03 MED ORDER — PROPOFOL 200 MG/20ML IV EMUL
200 MG/20ML | INTRAVENOUS | Status: DC | PRN
Start: 2021-09-03 — End: 2021-09-03
  Administered 2021-09-03: 16:00:00 150 via INTRAVENOUS

## 2021-09-03 MED ORDER — FENTANYL CITRATE (PF) 100 MCG/2ML IJ SOLN
100 MCG/2ML | INTRAMUSCULAR | Status: DC | PRN
Start: 2021-09-03 — End: 2021-09-03

## 2021-09-03 MED ORDER — IPRATROPIUM-ALBUTEROL 0.5-2.5 (3) MG/3ML IN SOLN
RESPIRATORY_TRACT | Status: AC
Start: 2021-09-03 — End: 2021-09-03
  Administered 2021-09-03: 19:00:00 1 via RESPIRATORY_TRACT

## 2021-09-03 MED ORDER — LORAZEPAM 2 MG/ML IJ SOLN
2 MG/ML | Freq: Once | INTRAMUSCULAR | Status: DC | PRN
Start: 2021-09-03 — End: 2021-09-03

## 2021-09-03 MED ORDER — ONDANSETRON HCL 4 MG/2ML IJ SOLN
4 MG/2ML | INTRAMUSCULAR | Status: DC | PRN
Start: 2021-09-03 — End: 2021-09-03
  Administered 2021-09-03: 17:00:00 4 via INTRAVENOUS

## 2021-09-03 MED ORDER — CEFAZOLIN SODIUM-DEXTROSE 2-3 GM-%(50ML) IV SOLR
2000 mg in Dextrose 3% | INTRAVENOUS | Status: AC
Start: 2021-09-03 — End: 2021-09-03
  Administered 2021-09-03: 16:00:00 2000 mg via INTRAVENOUS

## 2021-09-03 MED ORDER — FENTANYL CITRATE (PF) 100 MCG/2ML IJ SOLN
100 MCG/2ML | INTRAMUSCULAR | Status: AC
Start: 2021-09-03 — End: ?

## 2021-09-03 MED ORDER — MEPERIDINE HCL 25 MG/ML IJ SOLN
25 MG/ML | INTRAMUSCULAR | Status: DC | PRN
Start: 2021-09-03 — End: 2021-09-03

## 2021-09-03 MED ORDER — ACETAMINOPHEN 325 MG PO TABS
325 MG | Freq: Once | ORAL | Status: DC | PRN
Start: 2021-09-03 — End: 2021-09-03

## 2021-09-03 MED ORDER — EPHEDRINE SULFATE (PRESSORS) 50 MG/ML IV SOLN
50 MG/ML | INTRAVENOUS | Status: AC
Start: 2021-09-03 — End: ?

## 2021-09-03 MED ORDER — LIDOCAINE HCL (PF) 2 % IJ SOLN
2 % | INTRAMUSCULAR | Status: DC | PRN
Start: 2021-09-03 — End: 2021-09-03
  Administered 2021-09-03: 16:00:00 100 via INTRAVENOUS

## 2021-09-03 MED ORDER — ACETAMINOPHEN 500 MG PO TABS
500 MG | Freq: Once | ORAL | Status: AC
Start: 2021-09-03 — End: 2021-09-03
  Administered 2021-09-03: 15:00:00 1000 mg via ORAL

## 2021-09-03 MED ORDER — LACTATED RINGERS IV SOLN
INTRAVENOUS | Status: DC
Start: 2021-09-03 — End: 2021-09-03
  Administered 2021-09-03: 16:00:00 via INTRAVENOUS

## 2021-09-03 MED ORDER — ONDANSETRON HCL 4 MG/2ML IJ SOLN
4 MG/2ML | Freq: Once | INTRAMUSCULAR | Status: DC | PRN
Start: 2021-09-03 — End: 2021-09-03

## 2021-09-03 MED ORDER — IPRATROPIUM-ALBUTEROL 0.5-2.5 (3) MG/3ML IN SOLN
Freq: Once | RESPIRATORY_TRACT | Status: AC | PRN
Start: 2021-09-03 — End: 2021-09-03

## 2021-09-03 MED ORDER — PROPOFOL 200 MG/20ML IV EMUL
200 MG/20ML | INTRAVENOUS | Status: AC
Start: 2021-09-03 — End: ?

## 2021-09-03 MED FILL — ACETAMINOPHEN EXTRA STRENGTH 500 MG PO TABS: 500 MG | ORAL | Qty: 2

## 2021-09-03 MED FILL — XYLOCAINE-MPF 2 % IJ SOLN: 2 % | INTRAMUSCULAR | Qty: 5

## 2021-09-03 MED FILL — XYLOCAINE-MPF 1 % IJ SOLN: 1 % | INTRAMUSCULAR | Qty: 2

## 2021-09-03 MED FILL — ONDANSETRON HCL 4 MG/2ML IJ SOLN: 4 MG/2ML | INTRAMUSCULAR | Qty: 2

## 2021-09-03 MED FILL — DEXAMETHASONE SODIUM PHOSPHATE 4 MG/ML IJ SOLN: 4 MG/ML | INTRAMUSCULAR | Qty: 1

## 2021-09-03 MED FILL — KETOROLAC TROMETHAMINE 30 MG/ML IJ SOLN: 30 MG/ML | INTRAMUSCULAR | Qty: 1

## 2021-09-03 MED FILL — EPHEDRINE SULFATE (PRESSORS) 50 MG/ML IV SOLN: 50 MG/ML | INTRAVENOUS | Qty: 1

## 2021-09-03 MED FILL — CEFAZOLIN SODIUM-DEXTROSE 2-3 GM-%(50ML) IV SOLR: 2000 mg in Dextrose 3% | INTRAVENOUS | Qty: 50

## 2021-09-03 MED FILL — IPRATROPIUM-ALBUTEROL 0.5-2.5 (3) MG/3ML IN SOLN: RESPIRATORY_TRACT | Qty: 3

## 2021-09-03 MED FILL — DIPRIVAN 200 MG/20ML IV EMUL: 200 MG/20ML | INTRAVENOUS | Qty: 20

## 2021-09-03 MED FILL — MIDAZOLAM HCL 2 MG/2ML IJ SOLN: 2 MG/ML | INTRAMUSCULAR | Qty: 2

## 2021-09-03 MED FILL — FENTANYL CITRATE (PF) 100 MCG/2ML IJ SOLN: 100 MCG/2ML | INTRAMUSCULAR | Qty: 2

## 2021-09-03 NOTE — Op Note (Signed)
Operative Note      Patient: Jodi Powell  Date of Birth: 1950/12/03  MRN: [id number]    Date of Procedure: 10-03-21    Pre-Op Diagnosis Codes:     * Cystocele, midline [N81.11]     * Rectocele [N81.6]    Post-Op Diagnosis: Same with vaginal vault prolapse       Procedure(s):  VAGINAL REPAIR ANTERIOR AND POSTERIOR  PERINEAL REPAIR  Bilateral sacrospinous ligament vaginal vault suspension    Surgeon(s):  Shelda Altes, MD    Assistant:   * No surgical staff found *    Anesthesia: General    Estimated Blood Loss (mL): less than 50     Complications: None    Specimens:   * No specimens in log *    Implants:  * No implants in log *      Drains: * No LDAs found *    Findings: 1.  Surgically absent uterus and cervix 2.  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  Normal cystourethroscopy    Detailed Description of Procedure:     INDICATIONS FOR THE PROCEDURE: Jodi Powell is a 71year old female who presented with symptomatic pelvic organ prolapse. On exam, she had Stage 2 apical posterior compartment prolapse. The patient desired surgical. The risks and benefits of surgery included but not limited to injury to the bowel, bladder, ureter, internal organs; possibility of nerve entrapment and nerve injury; possibility of bleeding with subsequent transfusion were all discussed with the patient at a great length. She was also counseled on the risks of damage to internal organs, voiding dysfunction, defecatory dysfunction, mesh erosion, dyspareunia, recurrent prolapse, recurrent incontinence, DVT, PE, and death. Consents was then obtained for anterior and posterior pair of vaginal prolapse repair    DESCRIPTION OF THE PROCEDURE: Intravenous fluids, antibiotic prophylaxis and SCD hose were initiated. The patient was taken to the operating room where general anesthesia was administered and endotracheal intubation performed successfully. She was then positioned in lithotomy with  legs padded and placed in dolphin stirrups. Her arms were secured and padded. She was then prepped and draped in sterile fashion. A 16 Fr. Foley catheter was placed.  Surgical pause confirming patient identification, procedure and instruments for procedure was performed.  The [place] vaginal retractor was positioned and vaginal apex identified.  The lateral vaginal fornices and apical scar was noted.  Infiltration with 0.25 sent Marcaine with 1-100,000 epinephrine is performed at the vaginal apical scar which was excised sharply with the Mayo scissors.  Peritoneal catheter was indurated 2 cm in diameter which was closed with 2-0 Vicryl suture.  Retroperitoneal dissection was carried out bilaterally did find the ischial spines and sacrospinous ligaments digitally.  The capital suture Device was used to pass two 2-0 PDS sutures through the sacrospinous ligaments bilaterally 1 to 2 cm medial to the ischial spine on digital examination.  Cystoscopy was now performed with a 70 degree rigid cystoscope.  The urethra and trigone were noted to be anatomic position without lesions or defects.  Bilateral ureteral orifice ease were noted and clear E flux of urine was documented.  The bladder was assessed in all quadrants and no defects abnormalities are noted.  The bladder was now drained and Foley catheter replaced.  Using Mayo needle the sutures were passed to the anterior and posterior endopelvic fascia of the lateral vagina at the position of the neoapex.  The medial vaginal cuff was closed  with running 0 Vicryl suture.  The sacrospinous vault sutures were then tied down suspending the apex at -9 cm.  Attention was turned to the posterior vagina.  Anterior vaginal is well supported.    RECTOCELE REPAIR AND PERINEORRHAPHY:  After injection with the 0.25% Marcaine and 1:100K epinephrine mixture, a diamond-shaped wedge was marked outlining the area of rectocele using the #15 scapel and the epithelium excised with combination  of cautery and sharp dissection.  The rectocele was then plicated using 2-0 Vicryl suture in an running fashion.  The posterior vaginal epithelium was then closed using 3-0 Vicryl in a running fashion and the perineum closed in a subcuticular fashion.      A 4in Kerlex vaginal packing was place and the Foley catheter was removed for postop voiding trial.  Sponge and instrument counts were correct.  Surgical debrief was performed surgical team.  The patient tolerated the procedure well and was taken to the recovery room in stable condition extubation.      Electronically signed by Shelda Altes, MD on 09/03/2021 at 1:34 PM

## 2021-09-03 NOTE — Anesthesia Procedure Notes (Signed)
Airway  Date/Time: 09/03/2021 12:20 PM  Urgency: elective    Airway not difficult    General Information and Staff    Patient location during procedure: OR    Indications and Patient Condition  Indications for airway management: anesthesia  Spontaneous ventilation: present  Sedation level: deep  Preoxygenated: yes      Final Airway Details  Final airway type: supraglottic airway      Successful airway: oropharyngeal  Size 3  Cuff Pressure (cm H2O): 10     Number of attempts at approach: 1

## 2021-09-03 NOTE — Discharge Instructions (Signed)
Discharge instructions:  No driving for one to two weeks. May resume driving once no longer taking narcotic pain medications.  No strenuous activity for two weeks.  No  intercourse until examined and advised.  For pain you may take Extra Strength Tylenol (2 tablets every four hours), or ibuprofen (600  800 mg every six hours), or a prescription pain pill may also be prescribed.  You may shower or bathe.  There are no diet restrictions.  Avoid straining when having a bowel movement or urinating. If you experience constipation, you may take Milk of Magnesia or Metamucil (one teaspoon in a glass of juice), or you may use an over-the-counter laxative.  Call your doctor if you have excessive vaginal bleeding or a fever greater than 101 degrees. Expect some spotting or light bleeding for up to six weeks.  Schedule a post-operative appointment for one week and 3-4 weeks after your surgery.

## 2021-09-03 NOTE — H&P (Signed)
HISTORY AND PHYSICAL             Date: 09/03/2021        Patient Name: Jodi Powell     Date of Birth: 1950-06-01      Age:  71 y.o.    Chief Complaint        "Presents for surgery to treat vaginal prolapse"    History Obtained From   patient    History of Present Illness   Jodi Powell presents for scheduled anterior and posterior pair to treat symptomatic cystocele and rectocele.  The planned procedure was discussed and reviewed in context of her clinical history and symptoms.  Both surgical and nonsurgical options were discussed.  Her goals for this visit include proceed with surgery for prolapse.     Procedure consent:   Her history and examination are reviewed.  Treatment options were discussed to include nonsurgical and surgical intervention.  She is elected to proceed with surgical management for definitive result.  The goals and intent of surgery are to alleviate symptoms and improved pelvic floor function. The surgical risks while rare were reviewed and included but were not not limited to injury to the bowel, bladder, ureter, internal organs; possibility of nerve entrapment and nerve injury; possibility of bleeding with subsequent transfusion were all discussed with the patient at a great length. She was also counseled on the potential for unintended short-term and/or long-term voiding dysfunction, defecatory dysfunction,  dyspareunia, recurrent prolapse, recurrent incontinence, as well as the rest of the events despite preventative measures to include DVT, PE, and death.   Past Medical History     Past Medical History:   Diagnosis Date    : MM-03/20/20 , DXA- , EKG-11/29/19-Dr. Pinto     Allergies     cats, mold, mildew, dust    Chronic cough     Cystocele with rectocele     Hyperlipidemia     Hypertension     Recurrent gastrointestinal hemorrhage     AV malformations    S/P aortic valve replacement with bioprosthetic valve 2011    Dr Claris Gladden       02/09/2020 aortic valve replacement TAVR    Skin problem     Derm: Dr  Marcell Barlow    Thyroid nodule 12/20/2019    Right        Past Surgical History     Past Surgical History:   Procedure Laterality Date    AORTIC VALVE REPLACEMENT  06/2009    AORTIC VALVE REPLACEMENT  02/09/2020    Dr. Tenny Craw    APPENDECTOMY      CARDIAC CATHETERIZATION      CARDIAC CATHETERIZATION  01/2020    Dr. Aggie Cosier    COLONOSCOPY      HYSTERECTOMY (CERVIX STATUS UNKNOWN)      TAH AND BSO (CERVIX REMOVED)      oopherectomy due to ovarian torsion.    UPPER GASTROINTESTINAL ENDOSCOPY  12/2019    Dr. Claris Gladden        Medications Prior to Admission     Prior to Admission medications    Medication Sig Start Date End Date Taking? Authorizing Provider   NIACIN PO Take 1 tablet by mouth in the morning and at bedtime   Yes Historical Provider, MD   amLODIPine (NORVASC) 5 MG tablet 1 tablet Orally Once a day for 90 days  Patient taking differently: Take 1 tablet by mouth every morning 1 tablet Orally Once a day for 90 days 08/23/21  Sherlene Shams, NP-C   fluticasone (FLONASE) 50 MCG/ACT nasal spray 1 spray by Nasal route daily  Patient taking differently: 1 spray by Nasal route every morning 07/10/21   Malena Edman, MD   pantoprazole (PROTONIX) 40 MG tablet Take 1 tablet by mouth daily  Patient taking differently: Take 1 tablet by mouth every morning 04/06/21   Malena Edman, MD   rosuvastatin (CRESTOR) 5 MG tablet 1 tablet Orally Once a day for 90 days  Patient taking differently: Take 1 tablet by mouth every morning 1 tablet Orally Once a day for 90 days 03/26/21   Malena Edman, MD   losartan-hydroCHLOROthiazide (HYZAAR) 100-25 MG per tablet TAKE 1 TABLET BY MOUTH EVERY DAY FOR 90 DAYS  Patient taking differently: Take 1 tablet by mouth every morning 01/05/21   Sherlene Shams, NP-C   metoprolol succinate (TOPROL XL) 25 MG extended release tablet TAKE 1 TABLET BY MOUTH EVERY DAY FOR 90 DAYS  Patient taking differently: Take 1 tablet by mouth every morning 12/18/20   Malena Edman, MD   aspirin 81 MG EC tablet Take 1 tablet by  mouth daily 02/10/20   Historical Provider, MD   EPINEPHrine (EPIPEN) 0.3 MG/0.3ML SOAJ injection Inject 0.3 mLs into the muscle as needed 08/22/20   Historical Provider, MD   Cholecalciferol (VITAMIN D) 50 MCG (2000 UT) CAPS capsule Take 1 capsule by mouth daily    Historical Provider, MD   spironolactone (ALDACTONE) 25 MG tablet Take 0.5 tablets by mouth every other day    [facility] [employer]con[employer], MD        Allergies   Latex, Sulfa antibiotics, Adhesive tape, and Sulfur    Social History     Social History       Tobacco History       Smoking Status  Former Quit Date  1991 Smoking Frequency  0.50 packs/day for 40.00 years (20.00 pk-yrs) Smoking Tobacco Type  Cigarettes quit in 1991      Smokeless Tobacco Use  Never      Tobacco Comments  Patient counselled on the dangers of tobacco 12/06/2019, Cessation: Not applicable                 Alcohol History       Alcohol Use Status  Yes Drinks/Week  5 Glasses of wine per week Amount  5.0 standard drinks of alcohol/wk Comment  5 glasses of wine weekly              Drug Use       Drug Use Status  Never              Sexual Activity       Sexually Active  Not Currently                    Family History     Family History   Problem Relation Age of Onset    Elevated Lipids Mother     Coronary Art Dis Mother     Hypertension Mother     Breast Cancer Mother     Hypertension Father     Esophageal Cancer Father     Coronary Art Dis Father     Elevated Lipids Father     Hypertension Sister     Elevated Lipids Sister     Elevated Lipids Brother     Hypertension Brother        Review of Systems  Review of Systems   Genitourinary:         Prolapse   Allergic/Immunologic: Positive for environmental allergies.   Hematological:  Bruises/bleeds easily.   All other systems reviewed and are negative.    Physical Exam   Ht 5' 3.5" (1.613 m)   Wt 155 lb (70.3 kg)   BMI 27.02 kg/m   General: No acute distress, pleasant affect  Head/Ears/Nose/Throat: Normocephalic,  atraumatic  Skin: No obvious lesions, rashes, or bruises  Lungs: non-labored respirations  Cardiovascular: Warm and well perfused  Neurological: alert and responsive, able to ambulate and follow commands   Abdomen: soft, non-tender, no masses, normal bowel sounds      Pelvic Examination:  Vulva: Normal vulva: no lesions, masses, epithelial changes, or exudate Sensation: within normal limits  Vagina: Atrophic:  mild  Cervix: absent cervix  Urethra: {Normal with no masses, tenderness or scarring     Bimanual:Normal  Pelvic floor muscle contraction strength (Kegel): 1      Rectal Examination:  Rectal: not done  PB intact yes -      Myofascial Pelvic Pain Assessment:  Levator ani: No  Obturator internus: No  Periurethral pain: No     POP-Q Examination:  Aa -2 Ba -2 C -7   GH  4 PB   4 TVL 9   Ap0 Bp 0 D -            Labs    No results found for this or any previous visit (from the past 24 hour(s)).     Imaging/Diagnostics Last 24 Hours   No results found.    Assessment      Hospital Problems             Last Modified POA    Rectocele 06/26/2021 Unknown    Cystocele with rectocele 08/28/2021 Unknown       Plan   Anterior pair for cystocele and rectocele repair for posterior compartment prolapse  Anticipate outpatient procedure and protocol  Procedure and plan reviewed and patient consented    Consultations Ordered:  None    Electronically signed by Shelda Altes, MD on 09/03/21 at 5:16 AM EDT

## 2021-09-03 NOTE — Progress Notes (Signed)
All discharge instructions reviewed with patient. All questions answered. Patient has voided per Dr. Lubertha Basque order

## 2021-09-03 NOTE — Anesthesia Pre-Procedure Evaluation (Signed)
Department of Anesthesiology  Preprocedure Note       Name:  Jodi Powell   Age:  71 y.o.  DOB:  1950-03-27                                          MRN:  [id number]         Date:  09/03/2021      Surgeon: Moishe Spice):  Shelda Altes, MD    Procedure: Procedure(s):  VAGINAL REPAIR ANTERIOR AND POSTERIOR  PERINEAL REPAIR    Medications prior to admission:   Prior to Admission medications    Medication Sig Start Date End Date Taking? Authorizing Provider   NIACIN PO Take 1 tablet by mouth in the morning and at bedtime   Yes Historical Provider, MD   amLODIPine (NORVASC) 5 MG tablet 1 tablet Orally Once a day for 90 days  Patient taking differently: Take 1 tablet by mouth every morning 1 tablet Orally Once a day for 90 days 08/23/21   Sherlene Shams, NP-C   fluticasone (FLONASE) 50 MCG/ACT nasal spray 1 spray by Nasal route daily  Patient taking differently: 1 spray by Nasal route every morning 07/10/21   Malena Edman, MD   pantoprazole (PROTONIX) 40 MG tablet Take 1 tablet by mouth daily  Patient taking differently: Take 1 tablet by mouth every morning 04/06/21   Malena Edman, MD   rosuvastatin (CRESTOR) 5 MG tablet 1 tablet Orally Once a day for 90 days  Patient taking differently: Take 1 tablet by mouth every morning 1 tablet Orally Once a day for 90 days 03/26/21   Malena Edman, MD   losartan-hydroCHLOROthiazide (HYZAAR) 100-25 MG per tablet TAKE 1 TABLET BY MOUTH EVERY DAY FOR 90 DAYS  Patient taking differently: Take 1 tablet by mouth every morning 01/05/21   Sherlene Shams, NP-C   metoprolol succinate (TOPROL XL) 25 MG extended release tablet TAKE 1 TABLET BY MOUTH EVERY DAY FOR 90 DAYS  Patient taking differently: Take 1 tablet by mouth every morning 12/18/20   Malena Edman, MD   aspirin 81 MG EC tablet Take 1 tablet by mouth daily 02/10/20   Historical Provider, MD   EPINEPHrine (EPIPEN) 0.3 MG/0.3ML SOAJ injection Inject 0.3 mLs into the muscle as needed 08/22/20   Historical Provider, MD   Cholecalciferol  (VITAMIN D) 50 MCG (2000 UT) CAPS capsule Take 1 capsule by mouth daily    Historical Provider, MD   spironolactone (ALDACTONE) 25 MG tablet Take 0.5 tablets by mouth every other day    [facility], MD       Current medications:    No current facility-administered medications for this encounter.     Current Outpatient Medications   Medication Sig Dispense Refill   . NIACIN PO Take 1 tablet by mouth in the morning and at bedtime     . amLODIPine (NORVASC) 5 MG tablet 1 tablet Orally Once a day for 90 days (Patient taking differently: Take 1 tablet by mouth every morning 1 tablet Orally Once a day for 90 days) 90 tablet 3   . fluticasone (FLONASE) 50 MCG/ACT nasal spray 1 spray by Nasal route daily (Patient taking differently: 1 spray by Nasal route every morning) 1 each 3   . pantoprazole (PROTONIX) 40 MG tablet Take 1 tablet by mouth daily (Patient taking differently: Take 1 tablet  by mouth every morning) 90 tablet 1   . rosuvastatin (CRESTOR) 5 MG tablet 1 tablet Orally Once a day for 90 days (Patient taking differently: Take 1 tablet by mouth every morning 1 tablet Orally Once a day for 90 days) 90 tablet 1   . losartan-hydroCHLOROthiazide (HYZAAR) 100-25 MG per tablet TAKE 1 TABLET BY MOUTH EVERY DAY FOR 90 DAYS (Patient taking differently: Take 1 tablet by mouth every morning) 90 tablet 3   . metoprolol succinate (TOPROL XL) 25 MG extended release tablet TAKE 1 TABLET BY MOUTH EVERY DAY FOR 90 DAYS (Patient taking differently: Take 1 tablet by mouth every morning) 90 tablet 3   . aspirin 81 MG EC tablet Take 1 tablet by mouth daily     . EPINEPHrine (EPIPEN) 0.3 MG/0.3ML SOAJ injection Inject 0.3 mLs into the muscle as needed     . Cholecalciferol (VITAMIN D) 50 MCG (2000 UT) CAPS capsule Take 1 capsule by mouth daily     . spironolactone (ALDACTONE) 25 MG tablet Take 0.5 tablets by mouth every other day         Allergies:    Allergies   Allergen Reactions   . Latex Rash   . Sulfa  Antibiotics Headaches   . Adhesive Tape Rash   . Sulfur Rash       Problem List:    Patient Active Problem List   Diagnosis Code   . Abnormal findings on diagnostic imaging of other specified body structures R93.89   . Chronic GERD K21.9   . Dyslipidemia E78.5   . Essential hypertension I10   . Nasal congestion R09.81   . Nonrheumatic aortic (valve) stenosis I35.0   . Other specified symptoms and signs involving the circulatory and respiratory systems R09.89   . 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 Seasonal allergies J30.2   . Thyroid nodule E04.1   . 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 Aortic valve disorder I35.9   . Hypercholesterolemia E78.00   . Malignant melanoma (HCC) C43.9   . Spinal stenosis of lumbar region M48.061   . Vascular disorder I99.9   . Bladder problem N32.9   . 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 Rectocele N81.6   . 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 : MM-03/20/20 , DXA- , EKG-11/29/19-Dr. Pinto    . Allergies     cats, mold, mildew, dust   . Chronic cough    . Cystocele with rectocele    . Hyperlipidemia    . Hypertension    . Recurrent gastrointestinal hemorrhage     AV malformations   . S/P aortic valve replacement with bioprosthetic valve 2011    Dr Claris Gladden       02/09/2020 aortic valve replacement TAVR   . Skin problem     Derm: Dr Marcell Barlow   . Thyroid nodule 12/20/2019    Right       Past Surgical History:        Procedure Laterality Date   . AORTIC VALVE REPLACEMENT  06/2009   . AORTIC VALVE REPLACEMENT  02/09/2020    Dr. Tenny Craw   . APPENDECTOMY     . CARDIAC CATHETERIZATION     . CARDIAC CATHETERIZATION  01/2020    Dr. Aggie Cosier   . COLONOSCOPY     . HYSTERECTOMY (CERVIX STATUS UNKNOWN)     .  TAH AND BSO (CERVIX REMOVED)      oopherectomy due to ovarian torsion.   [facility] UPPER GASTROINTESTINAL ENDOSCOPY  12/2019    Dr. Claris Gladden       Social History:    Social History     Tobacco Use   . Smoking  status: Former     Packs/day: 0.50     Years: 40.00     Pack years: 20.00     Types: Cigarettes     Quit date: 1991     Years since quitting: 32.6   . Smokeless tobacco: Never   . Tobacco comments:     Patient counselled on the dangers of tobacco 12/06/2019, Cessation: Not applicable    Substance Use Topics   . Alcohol use: Yes     Alcohol/week: 5.0 standard drinks     Types: 5 Glasses of wine per week     Comment: 5 glasses of wine weekly                                Counseling given: Not Answered  Tobacco comments: Patient counselled on the dangers of tobacco 12/06/2019, Cessation: Not applicable       Vital Signs (Current):   Vitals:    08/27/21 1716   Weight: 155 lb (70.3 kg)   Height: 5' 3.5" (1.613 m)                                              BP Readings from Last 3 Encounters:   06/26/21 (!) 157/89   06/05/21 122/78   09/25/20 126/76       NPO Status:                                                                                 BMI:   Wt Readings from Last 3 Encounters:   08/27/21 155 lb (70.3 kg)   06/26/21 160 lb 14.4 oz (73 kg)   06/05/21 158 lb 14.4 oz (72.1 kg)     Body mass index is 27.02 kg/m.    CBC:   Lab Results   Component Value Date/Time    WBC 7.8 09/27/2020 09:25 AM    RBC 4.78 09/27/2020 09:25 AM    HGB 14.5 09/27/2020 09:25 AM    HCT 43.3 09/27/2020 09:25 AM    MCV 90.6 09/27/2020 09:25 AM    RDW 13.8 09/27/2020 09:25 AM    PLT 243 09/27/2020 09:25 AM       CMP:   Lab Results   Component Value Date/Time    NA 139 09/27/2020 09:25 AM    K 4.2 08/28/2021 10:41 AM    CL 99 09/27/2020 09:25 AM    CO2 24 09/27/2020 09:25 AM    BUN 18 09/27/2020 09:25 AM    CREATININE 0.8 09/27/2020 09:25 AM    GFRAA 102 02/23/2020 10:37 AM    LABGLOM 79 09/27/2020 09:25 AM    GLUCOSE 107 09/27/2020 09:25 AM    PROT 6.7 09/27/2020  09:25 AM    CALCIUM 9.8 09/27/2020 09:25 AM    BILITOT 0.85 09/27/2020 09:25 AM    ALKPHOS 97 09/27/2020 09:25 AM    AST 23 09/27/2020 09:25 AM    ALT 20 09/27/2020 09:25 AM        POC Tests: No results for input(s): POCGLU, POCNA, POCK, POCCL, POCBUN, POCHEMO, POCHCT in the last 72 hours.    Coags:   Lab Results   Component Value Date/Time    PROTIME 26.1 04/27/2020 10:28 AM    INR 2.4 04/27/2020 10:28 AM    APTT 32.6 01/31/2020 11:07 AM       HCG (If Applicable): No results found for: PREGTESTUR, PREGSERUM, HCG, HCGQUANT     ABGs: No results found for: PHART, PO2ART, PCO2ART, HCO3ART, BEART, O2SATART     Type & Screen (If Applicable):  No results found for: LABABO, LABRH    Drug/Infectious Status (If Applicable):  No results found for: HIV, HEPCAB    COVID-19 Screening (If Applicable):   Lab Results   Component Value Date/Time    COVID19 Not Detected 03/09/2020 02:27 PM    COVID19 Negative 02/06/2020 12:21 PM           Anesthesia Evaluation  Patient summary reviewed and Nursing notes reviewed  Airway: Mallampati: II          Dental: normal exam         Pulmonary:normal exam                               Cardiovascular:    (+) hypertension:, valvular problems/murmurs (s/p AVR for aortic stenosis 1/22 post op echo no perivalvular leak excellent position acceptable gradiant):,         Rhythm: regular  Rate: normal                    Neuro/Psych:               GI/Hepatic/Renal:   (+) GERD:,           Endo/Other:    (+) malignancy/cancer (h/o malignant melanoma).                 Abdominal: normal exam            Vascular:          Other Findings:           Anesthesia Plan      general     ASA 3     (LMA)  Induction: intravenous.      Anesthetic plan and risks discussed with patient.      Plan discussed with CRNA.    Attending anesthesiologist reviewed and agrees with Preprocedure content                De Burrs, MD   09/03/2021

## 2021-09-03 NOTE — Anesthesia Post-Procedure Evaluation (Signed)
Department of Anesthesiology  Postprocedure Note    Patient: Jodi Powell  MRN: [id number]  Birthdate: [birth year]  Date of evaluation: 09/03/2021      Procedure Summary     Date: 09/03/21 Room / Location: [facility] OR 02 / [facility] MAIN OR    Anesthesia Start: 1200 Anesthesia Stop: 1340    Procedures:       VAGINAL REPAIR ANTERIOR AND POSTERIOR (Vagina )      PERINEAL REPAIR (Perineum) Diagnosis:       Cystocele, midline      Rectocele      (Cystocele, midline [N81.11])      (Rectocele [N81.6])    Surgeons: Shelda Altes, MD Responsible Provider: De Burrs, MD    Anesthesia Type: General ASA Status: 3          Anesthesia Type: General    Aldrete Phase I: Aldrete Score: 8    Aldrete Phase II:        Anesthesia Post Evaluation    Patient location during evaluation: PACU  Patient participation: complete - patient participated  Level of consciousness: awake  Pain score: 2  Airway patency: patent  Nausea & Vomiting: no vomiting  Complications: no  Cardiovascular status: hemodynamically stable  Respiratory status: acceptable  Hydration status: stable  Comments: vss  See pacu notes  Pain management: adequate

## 2021-09-07 ENCOUNTER — Emergency Department: Admit: 2021-09-07 | Payer: MEDICARE | Primary: Family Medicine

## 2021-09-07 ENCOUNTER — Encounter

## 2021-09-07 ENCOUNTER — Inpatient Hospital Stay
Admit: 2021-09-07 | Discharge: 2021-09-07 | Disposition: A | Discharge status: Home / Self Care | Payer: MEDICARE | Attending: Emergency Medicine

## 2021-09-07 DIAGNOSIS — U071 COVID-19: Secondary | ICD-10-CM

## 2021-09-07 LAB — CBC WITH AUTO DIFFERENTIAL
Absolute Baso #: 0 10*3/uL (ref 0.0–0.2)
Absolute Eos #: 0 10*3/uL (ref 0.0–0.5)
Absolute Lymph #: 1.2 10*3/uL (ref 1.0–3.2)
Absolute Mono #: 0.6 10*3/uL (ref 0.3–1.0)
Basophils %: 0.2 % (ref 0.0–2.0)
Eosinophils %: 0.5 % (ref 0.0–7.0)
Hematocrit: 41.8 % (ref 34.0–47.0)
Hemoglobin: 13.9 g/dL (ref 11.5–15.7)
Immature Grans (Abs): 0.02 10*3/uL (ref 0.00–0.06)
Immature Granulocytes: 0.3 % (ref 0.0–0.6)
Lymphocytes: 18.3 % (ref 15.0–45.0)
MCH: 31.7 pg (ref 27.0–34.5)
MCHC: 33.3 g/dL (ref 32.0–36.0)
MCV: 95.2 fL (ref 81.0–99.0)
MPV: 11 fL (ref 7.2–13.2)
Monocytes: 8.9 % (ref 4.0–12.0)
NRBC Absolute: 0 10*3/uL (ref 0.000–0.012)
NRBC Automated: 0 % (ref 0.0–0.2)
Neutrophils %: 71.8 % (ref 42.0–74.0)
Neutrophils Absolute: 4.8 10*3/uL (ref 1.6–7.3)
Platelets: 226 10*3/uL (ref 140–440)
RBC: 4.39 x10e6/mcL (ref 3.60–5.20)
RDW: 12.1 % (ref 11.0–16.0)
WBC: 6.6 10*3/uL (ref 3.8–10.6)

## 2021-09-07 MED ORDER — NIRMATRELVIR&RITONAVIR 300/100 20 X 150 MG & 10 X 100MG PO TBPK
20 x 150 MG & 10 x 100MG | ORAL_TABLET | ORAL | 0 refills | Status: AC
Start: 2021-09-07 — End: 2021-09-12

## 2021-09-07 NOTE — ED Provider Notes (Signed)
[facility] DEPT  EMERGENCY DEPARTMENT ENCOUNTER      Pt Name: Jodi Powell  MRN: [id number]  Birthdate [birth year]  Date of evaluation: 09/07/2021  Provider: Vito Backers, MD    CHIEF COMPLAINT       Chief Complaint   Patient presents with    Cough     Cough and chest congestion for 6 days. Tested positive for covid today. Reports she feels like there's "something in her chest" and is unable to cough deeply due to rectocele repair surgery 4 days ago.         HISTORY OF PRESENT ILLNESS    HPI  Patient is a 71 y.o. female with a past medical history of hypertension, hyperlipidemia who presents with cough over the last 5 days.  She did test positive for COVID today.  She feels that she has congestion in her chest but no chest pain.  No fever or chills.  No nausea, vomiting, abdominal pain.  Does have a small amount of diarrhea.  No urinary symptoms.  She states that she is unable to cough deeply because she recently had a rectocele repair surgery 4 days ago.    Nursing Notes were reviewed.    REVIEW OF SYSTEMS       Review of Systems   Constitutional:  Negative for fever.   Respiratory:  Positive for cough. Negative for shortness of breath.    Cardiovascular:  Negative for chest pain.   Gastrointestinal:  Positive for diarrhea. Negative for abdominal pain, nausea and vomiting.   Genitourinary:  Negative for dysuria.     Except as noted above the remainder of the review of systems was reviewed and negative.       PAST MEDICAL HISTORY     Past Medical History:   Diagnosis Date    : MM-03/20/20 , DXA- , EKG-11/29/19-Dr. Pinto     Allergies     cats, mold, mildew, dust    Chronic cough     Cystocele with rectocele     Hyperlipidemia     Hypertension     Recurrent gastrointestinal hemorrhage     AV malformations    S/P aortic valve replacement with bioprosthetic valve 2011    Dr Claris Gladden       02/09/2020 aortic valve replacement TAVR    Skin problem     Derm: Dr Marcell Barlow    Thyroid nodule 12/20/2019    Right        SURGICAL HISTORY       Past Surgical History:   Procedure Laterality Date    AORTIC VALVE REPLACEMENT  06/2009    AORTIC VALVE REPLACEMENT  02/09/2020    Dr. Tenny Craw    APPENDECTOMY      CARDIAC CATHETERIZATION      CARDIAC CATHETERIZATION  01/2020    Dr. Aggie Cosier    COLONOSCOPY      HYSTERECTOMY (CERVIX STATUS UNKNOWN)      PERINEAL SURGERY N/A 09/03/2021    PERINEAL REPAIR performed by Shelda Altes, MD at [facility] MAIN OR    TAH AND BSO (CERVIX REMOVED)      oopherectomy due to ovarian torsion.    UPPER GASTROINTESTINAL ENDOSCOPY  12/2019    Dr. Claris Gladden    VAGINA SURGERY N/A 09/03/2021    VAGINAL REPAIR ANTERIOR AND POSTERIOR VAGINAL SUSPENSION performed by Shelda Altes, MD at [facility] MAIN OR    VAGINAL PROLAPSE REPAIR  09/03/2021       CURRENT MEDICATIONS  Discharge Medication List as of 09/07/2021  3:25 PM        CONTINUE these medications which have NOT CHANGED    Details   NIACIN PO Take 1 tablet by mouth in the morning and at bedtimeHistorical Med      amLODIPine (NORVASC) 5 MG tablet 1 tablet Orally Once a day for 90 days, Disp-90 tablet, R-3Normal      fluticasone (FLONASE) 50 MCG/ACT nasal spray 1 spray by Nasal route daily, Disp-1 each, R-3Normal      pantoprazole (PROTONIX) 40 MG tablet Take 1 tablet by mouth daily, Disp-90 tablet, R-1Normal      rosuvastatin (CRESTOR) 5 MG tablet 1 tablet Orally Once a day for 90 days, Disp-90 tablet, R-1Normal      losartan-hydroCHLOROthiazide (HYZAAR) 100-25 MG per tablet TAKE 1 TABLET BY MOUTH EVERY DAY FOR 90 DAYS, Disp-90 tablet, R-3Normal      metoprolol succinate (TOPROL XL) 25 MG extended release tablet TAKE 1 TABLET BY MOUTH EVERY DAY FOR 90 DAYS, Disp-90 tablet, R-3Normal      aspirin 81 MG EC tablet Take 1 tablet by mouth dailyHistorical Med      EPINEPHrine (EPIPEN) 0.3 MG/0.3ML SOAJ injection Inject 0.3 mLs into the muscle as neededHistorical Med      Cholecalciferol (VITAMIN D) 50 MCG (2000 UT) CAPS capsule Take 1 capsule by mouth dailyHistorical Med       spironolactone (ALDACTONE) 25 MG tablet Take 0.5 tablets by mouth every other dayHistorical Med             ALLERGIES     Latex, Sulfa antibiotics, Adhesive tape, and Sulfur    FAMILY HISTORY       Family History   Problem Relation Age of Onset    Elevated Lipids Mother     Coronary Art Dis Mother     Hypertension Mother     Breast Cancer Mother     Hypertension Father     Esophageal Cancer Father     Coronary Art Dis Father     Elevated Lipids Father     Hypertension Sister     Elevated Lipids Sister     Elevated Lipids Brother     Hypertension Brother         SOCIAL HISTORY       Social History     Socioeconomic History    Marital status: Widowed   Tobacco Use    Smoking status: Former     Packs/day: 0.50     Years: 40.00     Pack years: 20.00     Types: Cigarettes     Quit date: 1991     Years since quitting: 32.6    Smokeless tobacco: Never    Tobacco comments:     Patient counselled on the dangers of tobacco 12/06/2019, Cessation: Not applicable    Vaping Use    Vaping Use: Never used   Substance and Sexual Activity    Alcohol use: Yes     Alcohol/week: 5.0 standard drinks     Types: 5 Glasses of wine per week     Comment: 5 glasses of wine weekly    Drug use: Never    Sexual activity: Not Currently     Social Determinants of Health     Physical Activity: Unknown    Days of Exercise per Week: 4 days       SCREENINGS         Glasgow Coma Scale  Eye Opening: Spontaneous  Best Verbal Response: Oriented  Best Motor Response: Obeys commands  Glasgow Coma Scale Score: 15                     CIWA Assessment  BP: 134/84  Pulse: 56                 PHYSICAL EXAM    (up to 7 for level 4, 8 or more for level 5)     ED Triage Vitals [09/07/21 1431]   BP Temp Temp src Pulse Respirations SpO2 Height Weight - Scale   (!) 142/73 98.1 F (36.7 C) -- 68 18 91 % 5\' 3"  (1.6 m) 158 lb (71.7 kg)       Physical Exam  Vitals and nursing note reviewed.   Constitutional:       General: She is not in acute distress.     Appearance:  Normal appearance.   HENT:      Head: Normocephalic and atraumatic.      Right Ear: External ear normal.      Left Ear: External ear normal.      Nose: Nose normal.      Mouth/Throat:      Mouth: Mucous membranes are moist.   Eyes:      Extraocular Movements: Extraocular movements intact.      Conjunctiva/sclera: Conjunctivae normal.      Pupils: Pupils are equal, round, and reactive to light.   Cardiovascular:      Rate and Rhythm: Normal rate and regular rhythm.      Heart sounds: Normal heart sounds.   Pulmonary:      Effort: Pulmonary effort is normal.      Breath sounds: Normal breath sounds.   Abdominal:      General: Abdomen is flat.   Musculoskeletal:      Cervical back: Normal range of motion and neck supple.      Right lower leg: No edema.      Left lower leg: No edema.   Skin:     General: Skin is warm and dry.   Neurological:      Mental Status: She is alert. Mental status is at baseline.   Psychiatric:         Mood and Affect: Mood normal.         Behavior: Behavior normal.       DIAGNOSTIC RESULTS   PROCEDURES:  Unless otherwise noted below, none     Procedures    EKG: All EKG's are interpreted by the Emergency Department Physician who either signs or Co-signs this chart in the absence of a cardiologist.      RADIOLOGY:   Non-plain film images such as CT, Ultrasound and MRI are read by the radiologist. Plain radiographic images are visualized and preliminarily interpreted by the emergency physician with the below findings:    Interpretation per the Radiologist below, if available at the time of this note:    XR CHEST PORTABLE   Final Result   No radiographic evidence of acute cardiopulmonary process.              ED BEDSIDE ULTRASOUND:   Performed by ED Physician - none    LABS:  Labs Reviewed - No data to display    All other labs were within normal range or not returned as of this dictation.    EMERGENCY DEPARTMENT COURSE/REASSESSMENT and MDM:   Vitals:    Vitals:    09/07/21 1431 09/07/21  1515   BP:  (!) 142/73 134/84   Pulse: 68 56   Resp: 18 16   Temp: 98.1 F (36.7 C)    SpO2: 91% 93%   Weight: 71.7 kg    Height: 5\' 3"  (1.6 m)        ED Course:       MDM    Patient is a 71 y.o. female with a past medical history of hypertension, hyperlipidemia who presents with cough over the last 5 days.  Patient tested positive for COVID today.  Vitals reassuring patient is afebrile.  On exam, patient is breathing comfortably and satting on room air.  Lungs clear bilaterally.  Chest x-ray does not show any acute cardiopulmonary malady.  I discussed with patient that her work-up and exam is all very reassuring here.  She is requesting treatment for COVID and I have given her prescription for Paxlovid. Patient discharged. Return precautions discussed.     CONSULTS:  None    FINAL IMPRESSION      1. COVID-19          DISPOSITION/PLAN   DISPOSITION Decision To Discharge 09/07/2021 03:23:56 PM      PATIENT REFERRED TO:  No follow-up provider specified.    DISCHARGE MEDICATIONS:  Discharge Medication List as of 09/07/2021  3:25 PM        START taking these medications    Details   nirmatrelvir/ritonavir 300/100 (PAXLOVID) 20 x 150 MG & 10 x 100MG  TBPK Take 3 tablets (two 150 mg nirmatrelvir and one 100 mg ritonavir tablets) by mouth every 12 hours for 5 days., Disp-30 tablet, R-0Print           Controlled Substances Monitoring:     No flowsheet data found.    (Please note that portions of this note were completed with a voice recognition program.  Efforts were made to edit the dictations but occasionally words are mis-transcribed.)    11/07/2021, MD (electronically signed)  Attending Emergency Physician           , MD  09/07/21 [phone]

## 2021-09-07 NOTE — Discharge Instructions (Signed)
Please call to follow-up with your primary care doctor preferably within the next 24 to 48 hours.  Please return to the emergency room with any acute questions, concerns, worsening status or any delay to your outpatient care.  Please understand you received a general medical exam pertaining to your chief complaint.  This does not replace a thorough work-up done by your primary care provider.  Please ask your primary care provider about further evaluation and review of all labs and imaging that were conducted today and ask about any work-up needed for any abnormality or incidental finding.  Please understand it is your responsibility to follow-up with your primary care provider and follow-up on any and all lab work and imaging.  Failure to follow-up could lead to significant morbidity if not mortality.  It is always a pleasure to take care of you at this hospital we strongly encourage you to return to the emergency room if you have any questions or concerns or any worsening status.    If you do not have a primary care doctor please contact [phone].

## 2021-09-10 ENCOUNTER — Encounter
Admit: 2021-09-10 | Discharge: 2021-09-10 | Payer: MEDICARE | Attending: Obstetrics & Gynecology | Primary: Family Medicine

## 2021-09-10 DIAGNOSIS — N8110 Cystocele, unspecified: Secondary | ICD-10-CM

## 2021-09-10 NOTE — Progress Notes (Deleted)
09/10/2021       HPI:     Name: Jodi Powell  Date of Birth: 1950-04-19  Date of surgery: 08/16/2021  Procedure:VAGINAL REPAIR ANTERIOR AND POSTERIOR  PERINEAL REPAIR  Bilateral sacrospinous ligament vaginal vault suspension  CC:  post op evaluation.  HPI: Jodi Powell returns for a 1 week postop visit.   Reviewed history and postop course. In the interim she reports:  Voiding difficulty: No  Initiating stream normal: No  Force stream normal: Yes  Incontinence: no {Incontinence Urogyn 2:29610}  Urgency without incontinence: Yes Pt tested positive for covid coughing a lot  Frequency without incontinence: {YES/NO:19726} {Incontinence Urogyn 2:29610}  Bowel functions: {Bowel Urogyn:29611}   Pain Controlled: Yes  Abnormal vaginal discharge: No  Vaginal bleeding: No  Pathology: ***  Additional reports: ***    Past Medical History:   Past Medical History:   Diagnosis Date    : MM-03/20/20 , DXA- , EKG-11/29/19-Dr. Pinto     Allergies     cats, mold, mildew, dust    Chronic cough     Cystocele with rectocele     Hyperlipidemia     Hypertension     Recurrent gastrointestinal hemorrhage     AV malformations    S/P aortic valve replacement with bioprosthetic valve 2011    Dr Claris Gladden       02/09/2020 aortic valve replacement TAVR    Skin problem     Derm: Dr Marcell Barlow    Thyroid nodule 12/20/2019    Right     Past Surgical History:   Past Surgical History:   Procedure Laterality Date    AORTIC VALVE REPLACEMENT  06/2009    AORTIC VALVE REPLACEMENT  02/09/2020    Dr. Tenny Craw    APPENDECTOMY      CARDIAC CATHETERIZATION      CARDIAC CATHETERIZATION  01/2020    Dr. Aggie Cosier    COLONOSCOPY      HYSTERECTOMY (CERVIX STATUS UNKNOWN)      PERINEAL SURGERY N/A 09/03/2021    PERINEAL REPAIR performed by Shelda Altes, MD at [facility] MAIN OR    TAH AND BSO (CERVIX REMOVED)      oopherectomy due to ovarian torsion.    UPPER GASTROINTESTINAL ENDOSCOPY  12/2019    Dr. Claris Gladden    VAGINA SURGERY N/A 09/03/2021    VAGINAL REPAIR ANTERIOR AND POSTERIOR  VAGINAL SUSPENSION performed by Shelda Altes, MD at [facility] MAIN OR    VAGINAL PROLAPSE REPAIR  09/03/2021     Current Medications:  Current Outpatient Medications   Medication Sig Dispense Refill    nirmatrelvir/ritonavir 300/100 (PAXLOVID) 20 x 150 MG & 10 x 100MG  TBPK Take 3 tablets (two 150 mg nirmatrelvir and one 100 mg ritonavir tablets) by mouth every 12 hours for 5 days. 30 tablet 0    NIACIN PO Take 1 tablet by mouth in the morning and at bedtime      amLODIPine (NORVASC) 5 MG tablet 1 tablet Orally Once a day for 90 days (Patient taking differently: Take 1 tablet by mouth every morning 1 tablet Orally Once a day for 90 days) 90 tablet 3    fluticasone (FLONASE) 50 MCG/ACT nasal spray 1 spray by Nasal route daily (Patient taking differently: 1 spray by Nasal route every morning) 1 each 3    pantoprazole (PROTONIX) 40 MG tablet Take 1 tablet by mouth daily (Patient taking differently: Take 1 tablet by mouth every morning) 90 tablet 1    rosuvastatin (CRESTOR)  5 MG tablet 1 tablet Orally Once a day for 90 days (Patient taking differently: Take 1 tablet by mouth every morning 1 tablet Orally Once a day for 90 days) 90 tablet 1    losartan-hydroCHLOROthiazide (HYZAAR) 100-25 MG per tablet TAKE 1 TABLET BY MOUTH EVERY DAY FOR 90 DAYS (Patient taking differently: Take 1 tablet by mouth every morning) 90 tablet 3    metoprolol succinate (TOPROL XL) 25 MG extended release tablet TAKE 1 TABLET BY MOUTH EVERY DAY FOR 90 DAYS (Patient taking differently: Take 1 tablet by mouth every morning) 90 tablet 3    aspirin 81 MG EC tablet Take 1 tablet by mouth daily      EPINEPHrine (EPIPEN) 0.3 MG/0.3ML SOAJ injection Inject 0.3 mLs into the muscle as needed      Cholecalciferol (VITAMIN D) 50 MCG (2000 UT) CAPS capsule Take 1 capsule by mouth daily      spironolactone (ALDACTONE) 25 MG tablet Take 0.5 tablets by mouth every other day       No current facility-administered medications for this visit.     Allergies:    Allergies   Allergen Reactions    Latex Rash    Sulfa Antibiotics Headaches    Adhesive Tape Rash    Sulfur Rash     Social History:   Social History     Socioeconomic History    Marital status: Widowed     Spouse name: Not on file    Number of children: Not on file    Years of education: Not on file    Highest education level: Not on file   Occupational History    Not on file   Tobacco Use    Smoking status: Former     Packs/day: 0.50     Years: 40.00     Pack years: 20.00     Types: Cigarettes     Quit date: 62     Years since quitting: 32.6    Smokeless tobacco: Never    Tobacco comments:     Patient counselled on the dangers of tobacco 12/06/2019, Cessation: Not applicable    Vaping Use    Vaping Use: Never used   Substance and Sexual Activity    Alcohol use: Yes     Alcohol/week: 5.0 standard drinks     Types: 5 Glasses of wine per week     Comment: 5 glasses of wine weekly    Drug use: Never    Sexual activity: Not Currently   Other Topics Concern    Not on file   Social History Narrative    Not on file     Social Determinants of Health     Financial Resource Strain: Not on file   Food Insecurity: Not on file   Transportation Needs: Not on file   Physical Activity: Unknown    Days of Exercise per Week: 4 days    Minutes of Exercise per Session: Not on file   Stress: Not on file   Social Connections: Not on file   Intimate Partner Violence: Not on file   Housing Stability: Not on file     Family History:   Family History   Problem Relation Age of Onset    Elevated Lipids Mother     Coronary Art Dis Mother     Hypertension Mother     Breast Cancer Mother     Hypertension Father     Esophageal Cancer Father     Coronary  Art Dis Father     Elevated Lipids Father     Hypertension Sister     Elevated Lipids Sister     Elevated Lipids Brother     Hypertension Brother          Objective:      There were no vitals filed for this visit.   There is no height or weight on file to calculate BMI.   Physical  Exam  Physical Exam  Procedures/Test:  No results found for this visit on 09/10/21.      Lab Results   Component Value Date/Time    APPEARANCE Clear 01/31/2020 11:07 AM    COLORU Yellow 01/31/2020 11:07 AM    NITRU Negative 01/31/2020 11:07 AM    GLUCOSEU Negative 01/31/2020 11:07 AM    KETUA Negative 01/31/2020 11:07 AM    UROBILINOGEN 0.2 01/31/2020 11:07 AM    BILIRUBINUR Negative 01/31/2020 11:07 AM      Assessnent/Plan:     Lajuana Ripple is postop ***    Doing well {time Urogyn:29612}.   Activities and restrictions reviewed  Follow up in {Follow Up Urogyn:29613}.     Yetta Barre) Ladona Ridgel, MD, [facility]  [facility] Urogynecology and Pelvic Surgery  [address], [address]  [place], [place] [address]  Office: [phone]

## 2021-09-17 NOTE — Progress Notes (Signed)
Error

## 2021-09-22 ENCOUNTER — Encounter

## 2021-09-24 ENCOUNTER — Encounter

## 2021-09-24 MED ORDER — PANTOPRAZOLE SODIUM 40 MG PO TBEC
40 MG | ORAL_TABLET | Freq: Every day | ORAL | 1 refills | Status: DC
Start: 2021-09-24 — End: 2022-03-19

## 2021-09-24 MED ORDER — ROSUVASTATIN CALCIUM 5 MG PO TABS
5 MG | ORAL_TABLET | ORAL | 1 refills | Status: DC
Start: 2021-09-24 — End: 2022-03-27

## 2021-09-24 NOTE — Telephone Encounter (Signed)
TE to [place].  Rxs set up to send, #90, 1 refill.   LOV:  06/05/21  NOV: 10/02/21

## 2021-09-25 ENCOUNTER — Encounter

## 2021-09-25 LAB — CBC
Hematocrit: 38 % (ref 34.0–47.0)
Hemoglobin: 12.8 g/dL (ref 11.5–15.7)
MCH: 32.1 pg (ref 27.0–34.5)
MCHC: 33.7 g/dL (ref 32.0–36.0)
MCV: 95.2 fL (ref 81.0–99.0)
MPV: 10.2 fL (ref 7.2–13.2)
NRBC Absolute: 0 10*3/uL (ref 0.000–0.012)
NRBC Automated: 0 % (ref 0.0–0.2)
Platelets: 324 10*3/uL (ref 140–440)
RBC: 3.99 x10e6/mcL (ref 3.60–5.20)
RDW: 12.1 % (ref 11.0–16.0)
WBC: 7.9 10*3/uL (ref 3.8–10.6)

## 2021-09-25 LAB — COMPREHENSIVE METABOLIC PANEL
ALT: 19 U/L (ref 0–35)
AST: 21 U/L (ref 0–35)
Albumin/Globulin Ratio: 1.5 (ref 1.00–2.70)
Albumin: 4.1 g/dL (ref 3.5–5.2)
Alk Phosphatase: 91 U/L (ref 35–117)
Anion Gap: 15 mmol/L (ref 2–17)
BUN: 21 mg/dL (ref 8–23)
CO2: 24 mmol/L (ref 22–29)
Calcium: 9.7 mg/dL (ref 8.8–10.2)
Chloride: 98 mmol/L (ref 98–107)
Creatinine: 0.9 mg/dL (ref 0.5–1.0)
Est, Glom Filt Rate: 68 mL/min/1.73m (ref >=60)
Globulin: 2.8 g/dL (ref 1.9–4.4)
Glucose: 112 mg/dL — ABNORMAL HIGH (ref 70–99)
OSMOLALITY CALCULATED: 277 mOsm/kg (ref 270–287)
Potassium: 4.3 mmol/L (ref 3.5–5.3)
Sodium: 137 mmol/L (ref 135–145)
Total Bilirubin: 0.98 mg/dL (ref 0.00–1.20)
Total Protein: 6.9 g/dL (ref 6.4–8.3)

## 2021-09-25 LAB — VITAMIN D 25 HYDROXY: Vit D, 25-Hydroxy: 56.2 ng/mL (ref 30.0–90.0)

## 2021-09-25 LAB — LIPID PANEL
Chol/HDL Ratio: 2.6 (ref 0.0–4.4)
Cholesterol: 153 mg/dL (ref 100–200)
HDL: 59 mg/dL (ref >=50)
LDL Cholesterol: 71 mg/dL (ref 0.0–100.0)
LDL/HDL Ratio: 1.2
Triglycerides: 115 mg/dL (ref 0–149)
VLDL: 23 mg/dL (ref 5.0–40.0)

## 2021-09-25 LAB — TSH WITH REFLEX: TSH: 0.999 mcIU/mL (ref 0.358–3.740)

## 2021-10-02 ENCOUNTER — Ambulatory Visit: Admit: 2021-10-02 | Discharge: 2021-10-02 | Payer: MEDICARE | Attending: Family Medicine | Primary: Family Medicine

## 2021-10-02 ENCOUNTER — Ambulatory Visit
Admit: 2021-10-02 | Discharge: 2021-10-02 | Payer: MEDICARE | Attending: Obstetrics & Gynecology | Primary: Family Medicine

## 2021-10-02 DIAGNOSIS — Z0000 Encounter for general adult medical examination without abnormal findings: Secondary | ICD-10-CM

## 2021-10-02 DIAGNOSIS — Z1231 Encounter for screening mammogram for malignant neoplasm of breast: Secondary | ICD-10-CM

## 2021-10-02 DIAGNOSIS — Z09 Encounter for follow-up examination after completed treatment for conditions other than malignant neoplasm: Secondary | ICD-10-CM

## 2021-10-02 MED ORDER — FLUTICASONE PROPIONATE 50 MCG/ACT NA SUSP
50 MCG/ACT | Freq: Every morning | NASAL | 5 refills | Status: AC
Start: 2021-10-02 — End: ?

## 2021-10-02 NOTE — Patient Instructions (Signed)
A Healthy Heart: Care Instructions  Your Care Instructions     Coronary artery disease, also called heart disease, occurs when a substance called plaque builds up in the vessels that supply oxygen-rich blood to your heart muscle. This can narrow the blood vessels and reduce blood flow. A heart attack happens when blood flow is completely blocked. A high-fat diet, smoking, and other factors increase the risk of heart disease.  Your doctor has found that you have a chance of having heart disease. You can do lots of things to keep your heart healthy. It may not be easy, but you can change your diet, exercise more, and quit smoking. These steps really work to lower your chance of heart disease.  Follow-up care is a key part of your treatment and safety. Be sure to make and go to all appointments, and call your doctor if you are having problems. It's also a good idea to know your test results and keep a list of the medicines you take.  How can you care for yourself at home?  Diet   Use less salt when you cook and eat. This helps lower your blood pressure. Taste food before salting. Add only a little salt when you think you need it. With time, your taste buds will adjust to less salt.    Eat fewer snack items, fast foods, canned soups, and other high-salt, high-fat, processed foods.    Read food labels and try to avoid saturated and trans fats. They increase your risk of heart disease by raising cholesterol levels.    Limit the amount of solid fat-butter, margarine, and shortening-you eat. Use olive, peanut, or canola oil when you cook. Bake, broil, and steam foods instead of frying them.    Eat a variety of fruit and vegetables every day. Dark green, deep orange, red, or yellow fruits and vegetables are especially good for you. Examples include spinach, carrots, peaches, and berries.    Foods high in fiber can reduce your cholesterol and provide important vitamins and minerals. High-fiber foods include  whole-grain cereals and breads, oatmeal, beans, brown rice, citrus fruits, and apples.    Eat lean proteins. Heart-healthy proteins include seafood, lean meats and poultry, eggs, beans, peas, nuts, seeds, and soy products.    Limit drinks and foods with added sugar. These include candy, desserts, and soda pop.   Lifestyle changes   If your doctor recommends it, get more exercise. Walking is a good choice. Bit by bit, increase the amount you walk every day. Try for at least 30 minutes on most days of the week. You also may want to swim, bike, or do other activities.    Do not smoke. If you need help quitting, talk to your doctor about stop-smoking programs and medicines. These can increase your chances of quitting for good. Quitting smoking may be the most important step you can take to protect your heart. It is never too late to quit.    Limit alcohol to 2 drinks a day for men and 1 drink a day for women. Too much alcohol can cause health problems.    Manage other health problems such as diabetes, high blood pressure, and high cholesterol. If you think you may have a problem with alcohol or drug use, talk to your doctor.   Medicines   Take your medicines exactly as prescribed. Call your doctor if you think you are having a problem with your medicine.    If your doctor recommends aspirin, take  the amount directed each day. Make sure you take aspirin and not another kind of pain reliever, such as acetaminophen (Tylenol).   When should you call for help?   Call 911 if you have symptoms of a heart attack. These may include:   Chest pain or pressure, or a strange feeling in the chest.    Sweating.    Shortness of breath.    Pain, pressure, or a strange feeling in the back, neck, jaw, or upper belly or in one or both shoulders or arms.    Lightheadedness or sudden weakness.    A fast or irregular heartbeat.   After you call 911, the operator may tell you to chew 1 adult-strength or 2 to 4 low-dose aspirin.  Wait for an ambulance. Do not try to drive yourself.  Watch closely for changes in your health, and be sure to contact your doctor if you have any problems.  Where can you learn more?  Go to [employer] and enter F075 to learn more about "A Healthy Heart: Care Instructions."  Current as of: June 25, 2023Content Version: 13.8   2006-2023 [employer].   Care instructions adapted under license by [facility]. If you have questions about a medical condition or this instruction, always ask your healthcare professional. [employer] Inc[employer] disclaims any warranty or liability for your use of this information.      Personalized Preventive Plan for Jodi Powell - 10/02/2021  Medicare offers a range of preventive health benefits. Some of the tests and screenings are paid in full while other may be subject to a deductible, co-insurance, and/or copay.    Some of these benefits include a comprehensive review of your medical history including lifestyle, illnesses that may run in your family, and various assessments and screenings as appropriate.    After reviewing your medical record and screening and assessments performed today your provider may have ordered immunizations, labs, imaging, and/or referrals for you.  A list of these orders (if applicable) as well as your Preventive Care list are included within your After Visit Summary for your review.    Other Preventive Recommendations:    A preventive eye exam performed by an eye specialist is recommended every 1-2 years to screen for glaucoma; cataracts, macular degeneration, and other eye disorders.  A preventive dental visit is recommended every 6 months.  Try to get at least 150 minutes of exercise per week or 10,000 steps per day on a pedometer .  Order or download the FREE "Exercise & Physical Activity: Your Everyday Guide" from [employer]. Call (434)441-1919 or search [employer] online.  You need 1200-1500 mg of calcium and 1000-2000 IU of vitamin D per day. It is possible to meet your calcium requirement with diet alone, but a vitamin D supplement is usually necessary to meet this goal.  When exposed to the sun, use a sunscreen that protects against both UVA and UVB radiation with an SPF of 30 or greater. Reapply every 2 to 3 hours or after sweating, drying off with a towel, or swimming.  Always wear a seat belt when traveling in a car. Always wear a helmet when riding a bicycle or motorcycle.

## 2021-10-02 NOTE — Assessment & Plan Note (Signed)
Resolved  S/p repair of cystocele and rectocele 09/03/21 with Dr Ladona Ridgel

## 2021-10-02 NOTE — Assessment & Plan Note (Signed)
Chronic, stable  Continue current management

## 2021-10-02 NOTE — Progress Notes (Addendum)
Medicare Annual Wellness Visit    Jodi Powell is here for Medicare AWV (Copy//SAWV-HRA completed---sister is Silvio Pate Rix/Labs done-print/MM/DEXA-due 02/2022, ordered (pending)/09/03/21-Rectocele/Cystocele w/ Dr. Ladona Ridgel)    SAWV-HRA completed  Labs done-print  MM/DEXA-due 02/2022, ordered (pending)  09/03/21-Rectocele/Cystocele w/ Dr. Ladona Ridgel    Assessment & Plan   Medicare annual wellness visit, subsequent  Breast cancer screening by mammogram  -     MAM TOMO DIGITAL SCREEN BILATERAL (PER PROTOCOL); Future  Vitamin D deficiency  -     DEXA BONE DENSITY AXIAL SKELETON; Future  -     Vitamin D 25 Hydroxy; Future  Postmenopausal estrogen deficiency  -     DEXA BONE DENSITY AXIAL SKELETON; Future  Essential hypertension  Assessment & Plan:  Chronic, stable  Continue current management     Orders:  -     CBC; Future  -     TSH with Reflex (CERNER); Future  Dyslipidemia  Assessment & Plan:  Chronic, stable  Continue current management     Orders:  -     Comprehensive Metabolic Panel; Future  -     Lipid Panel; Future  -     TSH with Reflex (CERNER); Future  Chronic GERD  Assessment & Plan:  Chronic, stable  Continue current management     Nonrheumatic aortic (valve) stenosis  Assessment & Plan:  Chronic, stable  Patient s/p aortic valve replacement 06/2009 and 2022  Cardiology: Dr Claris Gladden       S/P aortic valve replacement with bioprosthetic valve  Recurrent gastrointestinal hemorrhage  Assessment & Plan:  Chronic, stable  No recent issues    Cystocele with rectocele  Assessment & Plan:  Resolved  S/p repair of cystocele and rectocele 09/03/21 with Dr Ladona Ridgel    Elevated glucose  -     Hemoglobin A1C; Future  Thyroid nodule  -     TSH with Reflex (CERNER); Future  Seasonal allergies  -     fluticasone (FLONASE) 50 MCG/ACT nasal spray; 1 spray by Nasal route every morning, Disp-4 each, R-5Normal  Screening for cardiovascular condition  -     PR Intensive Behavior Counseling for Cardiovascular Diseases, 8-15 minutes  [G0446]    Recommendations for Preventive Services Due: see orders and patient instructions/AVS.  Recommended screening schedule for the next 5-10 years is provided to the patient in written form: see Patient Instructions/AVS.     Return for 1 year-SAWV.     Subjective       Patient's complete Health Risk Assessment and screening values have been reviewed and are found in Flowsheets. The following problems were reviewed today and where indicated follow up appointments were made and/or referrals ordered.    No Positive Risk Factors identified today.    CV Risk Counseling:  Patient was asked about her current diet and exercise habits, and personalized advice was provided regarding recommended lifestyle changes. Patient's individual cardiovascular disease risk factors, including advanced age (> 33 for men, > 65 for women), dyslipidemia, and hypertension, were discussed, as well as the likely benefits of lifestyle changes. Based upon patient's motivation to change her behavior, the following plan was agreed upon to work toward lowering cardiovascular disease risk: low saturated fat, low cholesterol diet, Mediterranean diet, DASH diet, and increase physical activity, as tolerated.  Aspirin use for primary prevention of cardiovascular disease for men 45-79 and women 55-79: Indicated- continue daily aspirin. Educational materials for lifestyle changes were provided. Patient will follow-up in 1 year(s) with PCP. Provider spent 8  minutes counseling patient.                                 Objective   Vitals:    10/02/21 1018   BP: 110/68   Pulse: 59   Resp: 16   SpO2: 98%   Weight: 157 lb 4.8 oz (71.4 kg)      Body mass index is 27.86 kg/m.        GENERAL APPEARANCE: Patient is alert and oriented. No acute distress  EYES: pupils equal, round, reactive to light. No scleral icterus  LYMPH NODES: no lymphadenopathy.   HEART: no murmurs, normal rate and regular rhythm.   LUNGS: clear to auscultation bilaterally, good air  movement, no wheezes, rales, rhonchi.   SKIN: good turgor, no rashes noted, warm and dry.   MUSCULOSKELETAL: full range of motion, no swelling or deformity.   EXTREMITIES: no edema, clubbing or cyanosis.   NEUROLOGIC: nonfocal, motor strength normal upper and lower extremities, sensory exam intact.   PSYCH: alert, oriented, cognitive function intact, judgment and insight good, mood/affect appropriate.            Allergies   Allergen Reactions    Latex Rash    Sulfa Antibiotics Headaches    Adhesive Tape Rash    Sulfur Rash     Prior to Visit Medications    Medication Sig Taking? Authorizing Provider   fluticasone (FLONASE) 50 MCG/ACT nasal spray 1 spray by Nasal route every morning Yes Malena Edman, MD   rosuvastatin (CRESTOR) 5 MG tablet 1 tablet Orally Once a day for 90 days Yes Sherlene Shams, NP-C   pantoprazole (PROTONIX) 40 MG tablet Take 1 tablet by mouth daily Yes Sherlene Shams, NP-C   NIACIN PO Take 1 tablet by mouth in the morning and at bedtime Yes Historical Provider, MD   amLODIPine (NORVASC) 5 MG tablet 1 tablet Orally Once a day for 90 days  Patient taking differently: Take 1 tablet by mouth every morning 1 tablet Orally Once a day for 90 days Yes Sherlene Shams, NP-C   losartan-hydroCHLOROthiazide (HYZAAR) 100-25 MG per tablet TAKE 1 TABLET BY MOUTH EVERY DAY FOR 90 DAYS  Patient taking differently: Take 1 tablet by mouth every morning Yes Sherlene Shams, NP-C   metoprolol succinate (TOPROL XL) 25 MG extended release tablet TAKE 1 TABLET BY MOUTH EVERY DAY FOR 90 DAYS  Patient taking differently: Take 1 tablet by mouth every morning Yes Malena Edman, MD   aspirin 81 MG EC tablet Take 1 tablet by mouth daily Yes Historical Provider, MD   EPINEPHrine (EPIPEN) 0.3 MG/0.3ML SOAJ injection Inject 0.3 mLs into the muscle as needed Yes Historical Provider, MD   Cholecalciferol (VITAMIN D) 50 MCG (2000 UT) CAPS capsule Take 1 capsule by mouth daily Yes Historical Provider, MD   spironolactone (ALDACTONE) 25  MG tablet Take 0.5 tablets by mouth every other day Yes [facility]conciliation, MD       CareTeam (Including outside providers/suppliers regularly involved in providing care):   Patient Care Team:  Malena Edman, MD as PCP - General  Malena Edman, MD as PCP - Empaneled Provider  Megan Mans, MD as Consulting Physician (Internal Medicine)     Reviewed and updated this visit:  Tobacco  Allergies  Meds  Problems  Med Hx  Surg Hx  Soc Hx  Fam Hx

## 2021-10-02 NOTE — Assessment & Plan Note (Signed)
Chronic, stable  Patient s/p aortic valve replacement 06/2009 and 2022  Cardiology: Dr Claris Gladden

## 2021-10-02 NOTE — Assessment & Plan Note (Signed)
Chronic, stable  No recent issues

## 2021-10-02 NOTE — Progress Notes (Signed)
10/02/2021       HPI:     Name: Jodi Powell  Date of Birth: Sep 20, 1950  Date of surgery: 09/03/2021  Procedure: VAGINAL REPAIR ANTERIOR AND POSTERIOR  PERINEAL REPAIR  Bilateral sacrospinous ligament vaginal vault suspension  CC:  post op evaluation.  HPI: Jodi Powell returns for a 3 week postop visit.   Reviewed history and postop course. In the interim she reports:  Voiding difficulty: No  Initiating stream normal: No  Force stream normal: Yes  Incontinence: no   Urgency without incontinence: No   Frequency without incontinence: Denies  Bowel functions: constipation  Taking senikot; stool softeners  Pain Controlled: Yes  Abnormal vaginal discharge: No  Vaginal bleeding: No  Pathology: N/A  Additional reports: Is recovering from COVID upper respiratory tract infection and doing well.  Denies vaginal bleeding reports only small amount of vaginal discharge.  Denies incontinence stated above.    Past Medical History:   Past Medical History:   Diagnosis Date    : MM-03/20/20 , DXA- , EKG-11/29/19-Dr. Pinto     Allergies     cats, mold, mildew, dust    Chronic cough     Cystocele with rectocele     Hyperlipidemia     Hypertension     Recurrent gastrointestinal hemorrhage     AV malformations    S/P aortic valve replacement with bioprosthetic valve 2011    Dr Claris Gladden       02/09/2020 aortic valve replacement TAVR    Skin problem     Derm: Dr Marcell Barlow    Thyroid nodule 12/20/2019    Right     Past Surgical History:   Past Surgical History:   Procedure Laterality Date    AORTIC VALVE REPLACEMENT  06/2009    AORTIC VALVE REPLACEMENT  02/09/2020    Dr. Tenny Craw    APPENDECTOMY      CARDIAC CATHETERIZATION      CARDIAC CATHETERIZATION  01/2020    Dr. Aggie Cosier    CARDIAC VALVE REPLACEMENT      COLONOSCOPY      HYSTERECTOMY (CERVIX STATUS UNKNOWN)      HYSTERECTOMY, VAGINAL      PERINEAL SURGERY N/A 09/03/2021    PERINEAL REPAIR performed by Shelda Altes, MD at [facility] MAIN OR    TAH AND BSO (CERVIX REMOVED)      oopherectomy due to  ovarian torsion.    UPPER GASTROINTESTINAL ENDOSCOPY  12/2019    Dr. Claris Gladden    VAGINA SURGERY N/A 09/03/2021    VAGINAL REPAIR ANTERIOR AND POSTERIOR VAGINAL SUSPENSION performed by Shelda Altes, MD at [facility] MAIN OR    VAGINAL PROLAPSE REPAIR  09/03/2021     Current Medications:  Current Outpatient Medications   Medication Sig Dispense Refill    fluticasone (FLONASE) 50 MCG/ACT nasal spray 1 spray by Nasal route every morning 4 each 5    rosuvastatin (CRESTOR) 5 MG tablet 1 tablet Orally Once a day for 90 days 90 tablet 1    pantoprazole (PROTONIX) 40 MG tablet Take 1 tablet by mouth daily 90 tablet 1    NIACIN PO Take 1 tablet by mouth in the morning and at bedtime      amLODIPine (NORVASC) 5 MG tablet 1 tablet Orally Once a day for 90 days (Patient taking differently: Take 1 tablet by mouth every morning 1 tablet Orally Once a day for 90 days) 90 tablet 3    losartan-hydroCHLOROthiazide (HYZAAR) 100-25 MG per tablet TAKE  1 TABLET BY MOUTH EVERY DAY FOR 90 DAYS (Patient taking differently: Take 1 tablet by mouth every morning) 90 tablet 3    metoprolol succinate (TOPROL XL) 25 MG extended release tablet TAKE 1 TABLET BY MOUTH EVERY DAY FOR 90 DAYS (Patient taking differently: Take 1 tablet by mouth every morning) 90 tablet 3    aspirin 81 MG EC tablet Take 1 tablet by mouth daily      EPINEPHrine (EPIPEN) 0.3 MG/0.3ML SOAJ injection Inject 0.3 mLs into the muscle as needed      Cholecalciferol (VITAMIN D) 50 MCG (2000 UT) CAPS capsule Take 1 capsule by mouth daily      spironolactone (ALDACTONE) 25 MG tablet Take 0.5 tablets by mouth every other day       No current facility-administered medications for this visit.     Allergies:   Allergies   Allergen Reactions    Latex Rash    Sulfa Antibiotics Headaches    Adhesive Tape Rash    Sulfur Rash     Social History:   Social History     Socioeconomic History    Marital status: Widowed     Spouse name: Not on file    Number of children: Not on file    Years of  education: Not on file    Highest education level: Not on file   Occupational History    Not on file   Tobacco Use    Smoking status: Former     Packs/day: 0.50     Years: 40.00     Pack years: 20.00     Types: Cigarettes     Quit date: 73     Years since quitting: 32.6    Smokeless tobacco: Never    Tobacco comments:     Patient counselled on the dangers of tobacco 12/06/2019, Cessation: Not applicable    Vaping Use    Vaping Use: Never used   Substance and Sexual Activity    Alcohol use: Yes     Alcohol/week: 5.0 standard drinks     Types: 5 Glasses of wine per week     Comment: 5 glasses of wine weekly    Drug use: Never    Sexual activity: Not Currently   Other Topics Concern    Not on file   Social History Narrative    Not on file     Social Determinants of Health     Financial Resource Strain: Not on file   Food Insecurity: Not on file   Transportation Needs: Not on file   Physical Activity: Insufficiently Active    Days of Exercise per Week: 4 days    Minutes of Exercise per Session: 30 min   Stress: Not on file   Social Connections: Not on file   Intimate Partner Violence: Not on file   Housing Stability: Not on file     Family History:   Family History   Problem Relation Age of Onset    Elevated Lipids Mother     Coronary Art Dis Mother     Hypertension Mother     Breast Cancer Mother     Hypertension Father     Esophageal Cancer Father     Coronary Art Dis Father     Elevated Lipids Father     Hypertension Sister     Elevated Lipids Sister     Elevated Lipids Brother     Hypertension Brother  Objective:      There were no vitals filed for this visit.   There is no height or weight on file to calculate BMI.   Physical Exam  Physical Exam  HENT:      Head: Normocephalic and atraumatic.   Cardiovascular:      Rate and Rhythm: Normal rate.      Pulses: Normal pulses.   Pulmonary:      Effort: Pulmonary effort is normal.   Abdominal:      General: Abdomen is flat.      Comments: Incisions intact and  healing well   Genitourinary:     General: Normal vulva.      Comments:   POP-Q Examination:  Aa -3 Ba -3 C -8  GH 4 PB  4 TVL 8  Ap-2 Bp -2 D -    Sutures present incisions intact  Skin:     General: Skin is dry.   Neurological:      General: No focal deficit present.      Mental Status: She is alert and oriented to person, place, and time.   Psychiatric:         Mood and Affect: Mood normal.         Behavior: Behavior normal.     Procedures/Test:  No results found for this visit on 10/02/21.      Lab Results   Component Value Date/Time    APPEARANCE Clear 01/31/2020 11:07 AM    COLORU Yellow 01/31/2020 11:07 AM    NITRU Negative 01/31/2020 11:07 AM    GLUCOSEU Negative 01/31/2020 11:07 AM    KETUA Negative 01/31/2020 11:07 AM    UROBILINOGEN 0.2 01/31/2020 11:07 AM    BILIRUBINUR Negative 01/31/2020 11:07 AM      Assessnent/Plan:     Lajuana Ripple is postop VAGINAL REPAIR ANTERIOR AND POSTERIOR  PERINEAL REPAIR  Bilateral sacrospinous ligament vaginal vault suspension    Doing well 4 weeks postop.   Activities and restrictions reviewed  Follow up in 4 weeks   Encourage continue use of stool softeners    Yetta Barre) Ladona Ridgel, MD, [facility]  [facility] Urogynecology and Pelvic Surgery  [address], [address]  [place], [place] [address]  Office: [phone]

## 2021-10-31 NOTE — Telephone Encounter (Signed)
Pt called and asked if she could sched an ECHO before her appt in Dec. Family had covid and passed away and would like to get a test scheduled.

## 2021-10-31 NOTE — Telephone Encounter (Signed)
I called and left a voicemail regarding this as this message does not make sense. Patient had a recent echo in February 2023 that was largely unchanged from prior echo. Needing more information prior to moving forward with this. I have also sent a message to the patient via mychart.

## 2021-10-31 NOTE — Telephone Encounter (Signed)
Patient called back stating she and her twin sister had covid around 4 weeks ago and her sister passed away. Unsure if related to covid per pt. However, she wanted to get another echo given the circumstances. She is not having any issues at all besides fatigue which she states is just do to everything going on. I advised that our biggest hurdle would be to get insurance to cover the testing due to just having an echo. She stated she would be fine waiting until February again. I stated since she was not having any issues its reasonable to wait but if things change to please contact our office.

## 2021-11-13 ENCOUNTER — Ambulatory Visit
Admit: 2021-11-13 | Discharge: 2021-11-13 | Payer: MEDICARE | Attending: Obstetrics & Gynecology | Primary: Family Medicine

## 2021-11-13 DIAGNOSIS — N816 Rectocele: Secondary | ICD-10-CM

## 2021-11-13 MED ORDER — ESTRADIOL 0.1 MG/GM VA CREA
0.1 MG/GM | VAGINAL | 3 refills | Status: AC
Start: 2021-11-13 — End: 2022-06-20

## 2021-11-13 NOTE — Progress Notes (Signed)
11/13/2021       HPI:     Name: Jodi Powell  Date of Birth: [birth year]  Date of surgery: 09/03/2021  Procedure: Vaginal Repair  Anterior and Posterior  CC:  post op evaluation.  HPI: Jodi Powell returns for a 8  week postop visit.   Reviewed history and postop course. In the interim she reports:  Voiding difficulty: No  Initiating stream normal: No  Force stream normal: No  Incontinence: no   Urgency without incontinence: No   Frequency without incontinence: Denies  Bowel functions: normal; reports increased gas   Pain Controlled: Yes  Abnormal vaginal discharge: No  Vaginal bleeding: No  Pathology:   Additional reports: Discussed using probiotic and monitoring for gas producing diet triggers to acute legumes and vegetables.      Past Medical History:   Past Medical History:   Diagnosis Date    : MM-03/20/20 , DXA- , EKG-11/29/19-Dr. Pinto     Allergies     cats, mold, mildew, dust    Chronic cough     Cystocele with rectocele     Hyperlipidemia     Hypertension     Recurrent gastrointestinal hemorrhage     AV malformations    S/P aortic valve replacement with bioprosthetic valve 2011    Dr Vennie Homans       02/09/2020 aortic valve replacement TAVR    Skin problem     Derm: Dr Cari Caraway    Thyroid nodule 12/20/2019    Right     Past Surgical History:   Past Surgical History:   Procedure Laterality Date    AORTIC VALVE REPLACEMENT  06/2009    AORTIC VALVE REPLACEMENT  02/09/2020    Dr. Harrington Challenger    APPENDECTOMY      CARDIAC CATHETERIZATION      CARDIAC CATHETERIZATION  01/2020    Dr. Margo Aye    CARDIAC VALVE REPLACEMENT      COLONOSCOPY      HYSTERECTOMY (CERVIX STATUS UNKNOWN)      HYSTERECTOMY, VAGINAL      PERINEAL SURGERY N/A 09/03/2021    PERINEAL REPAIR performed by Jama Flavors, MD at [facility] MAIN OR    TAH AND BSO (CERVIX REMOVED)      oopherectomy due to ovarian torsion.    UPPER GASTROINTESTINAL ENDOSCOPY  12/2019    Dr. Vennie Homans    VAGINA SURGERY N/A 09/03/2021    VAGINAL REPAIR ANTERIOR AND POSTERIOR VAGINAL SUSPENSION  performed by Jama Flavors, MD at [facility]  09/03/2021     Current Medications:  Current Outpatient Medications   Medication Sig Dispense Refill    estradiol (ESTRACE VAGINAL) 0.1 MG/GM vaginal cream Place 1 g vaginally once a week 42.5 g 3    fluticasone (FLONASE) 50 MCG/ACT nasal spray 1 spray by Nasal route every morning 4 each 5    rosuvastatin (CRESTOR) 5 MG tablet 1 tablet Orally Once a day for 90 days 90 tablet 1    pantoprazole (PROTONIX) 40 MG tablet Take 1 tablet by mouth daily 90 tablet 1    NIACIN PO Take 1 tablet by mouth in the morning and at bedtime      amLODIPine (NORVASC) 5 MG tablet 1 tablet Orally Once a day for 90 days (Patient taking differently: Take 1 tablet by mouth every morning 1 tablet Orally Once a day for 90 days) 90 tablet 3    losartan-hydroCHLOROthiazide (HYZAAR) 100-25 MG per tablet  TAKE 1 TABLET BY MOUTH EVERY DAY FOR 90 DAYS (Patient taking differently: Take 1 tablet by mouth every morning) 90 tablet 3    metoprolol succinate (TOPROL XL) 25 MG extended release tablet TAKE 1 TABLET BY MOUTH EVERY DAY FOR 90 DAYS (Patient taking differently: Take 1 tablet by mouth every morning) 90 tablet 3    aspirin 81 MG EC tablet Take 1 tablet by mouth daily      EPINEPHrine (EPIPEN) 0.3 MG/0.3ML SOAJ injection Inject 0.3 mLs into the muscle as needed      Cholecalciferol (VITAMIN D) 50 MCG (2000 UT) CAPS capsule Take 1 capsule by mouth daily      spironolactone (ALDACTONE) 25 MG tablet Take 0.5 tablets by mouth every other day       No current facility-administered medications for this visit.     Allergies:   Allergies   Allergen Reactions    Latex Rash    Sulfa Antibiotics Headaches    Adhesive Tape Rash    Sulfur Rash     Social History:   Social History     Socioeconomic History    Marital status: Widowed     Spouse name: Not on file    Number of children: Not on file    Years of education: Not on file    Highest education level: Not on file   Occupational  History    Not on file   Tobacco Use    Smoking status: Former     Packs/day: 0.50     Years: 40.00     Additional pack years: 0.00     Total pack years: 20.00     Types: Cigarettes     Quit date: 18     Years since quitting: 32.8    Smokeless tobacco: Never    Tobacco comments:     Patient counselled on the dangers of tobacco 12/06/2019, Cessation: Not applicable    Vaping Use    Vaping Use: Never used   Substance and Sexual Activity    Alcohol use: Yes     Alcohol/week: 5.0 standard drinks of alcohol     Types: 5 Glasses of wine per week     Comment: 5 glasses of wine weekly    Drug use: Never    Sexual activity: Not Currently   Other Topics Concern    Not on file   Social History Narrative    Not on file     Social Determinants of Health     Financial Resource Strain: Not on file   Food Insecurity: Not on file   Transportation Needs: Not on file   Physical Activity: Insufficiently Active (09/24/2021)    Exercise Vital Sign     Days of Exercise per Week: 4 days     Minutes of Exercise per Session: 30 min   Stress: Not on file   Social Connections: Not on file   Intimate Partner Violence: Not on file   Housing Stability: Not on file     Family History:   Family History   Problem Relation Age of Onset    Elevated Lipids Mother     Coronary Art Dis Mother     Hypertension Mother     Breast Cancer Mother     Hypertension Father     Esophageal Cancer Father     Coronary Art Dis Father     Elevated Lipids Father     Hypertension Sister     Elevated Lipids Sister  Elevated Lipids Brother     Hypertension Brother          Objective:      There were no vitals filed for this visit.   There is no height or weight on file to calculate BMI.   Physical Exam  Physical Exam  HENT:      Head: Normocephalic and atraumatic.   Cardiovascular:      Rate and Rhythm: Normal rate.      Pulses: Normal pulses.   Pulmonary:      Effort: Pulmonary effort is normal.   Abdominal:      General: Abdomen is flat.      Comments: Incisions  intact and healing well   Genitourinary:     General: Normal vulva.      Comments:   POP-Q Examination:  Aa -2 Ba -2 C -7  GH4 PB 4 TVL 8  Ap-1 Bp 0 D-     No granulation tissue noted incisions healing well  Skin:     General: Skin is dry.   Neurological:      General: No focal deficit present.      Mental Status: She is alert and oriented to person, place, and time.   Psychiatric:         Mood and Affect: Mood normal.         Behavior: Behavior normal.       Procedures/Test:  No results found for this visit on 11/13/21.      Lab Results   Component Value Date/Time    APPEARANCE Clear 01/31/2020 11:07 AM    COLORU Yellow 01/31/2020 11:07 AM    NITRU Negative 01/31/2020 11:07 AM    GLUCOSEU Negative 01/31/2020 11:07 AM    KETUA Negative 01/31/2020 11:07 AM    UROBILINOGEN 0.2 01/31/2020 11:07 AM    BILIRUBINUR Negative 01/31/2020 11:07 AM      Assessnent/Plan:     Lajuana Ripple is postop Sacrospinous ligament fixation; anterior posterior repair    Doing well .   Activities and restrictions reviewed  Follow up in  3 months for follow-up assessment of posterior compartment .   Start vaginal Estrace 1 g weekly      Yetta Barre) Ladona Ridgel, MD, [facility]  [facility] Urogynecology and Pelvic Surgery  [address]  [place], [place] [address]  Office: [phone]

## 2021-11-13 NOTE — Assessment & Plan Note (Signed)
Asymptomatic but persistent rectocele noted  Encourage bowel regimen to avoid constipation and chronic Valsalva  We will follow-up in 3 months for repeat examination

## 2021-12-18 ENCOUNTER — Encounter

## 2021-12-18 ENCOUNTER — Encounter
Admit: 2021-12-18 | Discharge: 2021-12-18 | Payer: MEDICARE | Attending: Obstetrics & Gynecology | Primary: Family Medicine

## 2021-12-18 DIAGNOSIS — N8189 Other female genital prolapse: Secondary | ICD-10-CM

## 2021-12-18 NOTE — Progress Notes (Signed)
Jodi Powell (DOB:  [birth year]) is a 71 y.o. female. Established patient here for evaluation of the following chief complaint(s):  Follow-up (Bulging rectum started a month after surgery )      PCP: Alexander Bergeron, MD   Referring provider: No referring provider defined for this encounter.     ASSESSMENT/PLAN:  1. Pelvic floor weakness in female  Assessment & Plan:   Symptoms resulting in anal incontinence related to pelvic floor muscle weakness and prolapse.  Refer for pelvic floor physical therapy and discussed diet behavior modification      No follow-ups on file.    SUBJECTIVE/OBJECTIVE:  HPI  Jodi Powell presents for a follow up visit for pelvic organ prolapse.  She underwent rectocele repair and anterior pair with sacrospinous ligament fixation August 2023.  She states that shortly after surgery started having a vaginal bulge and pressure.  Symptoms not worsened.  She is also having anal incontinence of gas.  Denies urinary leakage or fecal seepage.  Denies constipation.  She reports denying pain or bleeding.     Past Medical History:   Past Medical History        Past Medical History:   Diagnosis Date    : MM-03/20/20 , DXA- , EKG-11/29/19-Dr. Pinto      Allergies       cats, mold, mildew, dust    Chronic cough      Cystocele with rectocele      Hyperlipidemia      Hypertension      Recurrent gastrointestinal hemorrhage       AV malformations    S/P aortic valve replacement with bioprosthetic valve 2011     Dr Vennie Homans       02/09/2020 aortic valve replacement TAVR    Skin problem       Derm: Dr Cari Caraway    Thyroid nodule 12/20/2019     Right         Past Surgical History:   Past Surgical History         Past Surgical History:   Procedure Laterality Date    AORTIC VALVE REPLACEMENT   06/2009    AORTIC VALVE REPLACEMENT   02/09/2020     Dr. Harrington Challenger    APPENDECTOMY        CARDIAC CATHETERIZATION        CARDIAC CATHETERIZATION   01/2020     Dr. Margo Aye    CARDIAC VALVE REPLACEMENT        COLONOSCOPY        HYSTERECTOMY  (CERVIX STATUS UNKNOWN)        HYSTERECTOMY, VAGINAL        PERINEAL SURGERY N/A 09/03/2021     PERINEAL REPAIR performed by Jama Flavors, MD at [facility] MAIN OR    TAH AND BSO (CERVIX REMOVED)         oopherectomy due to ovarian torsion.    UPPER GASTROINTESTINAL ENDOSCOPY   12/2019     Dr. Vennie Homans    VAGINA SURGERY N/A 09/03/2021     VAGINAL REPAIR ANTERIOR AND POSTERIOR VAGINAL SUSPENSION performed by Jama Flavors, MD at [facility]   09/03/2021         Current Medications:  Current Facility-Administered Medications          Current Outpatient Medications   Medication Sig Dispense Refill    fluticasone (FLONASE) 50 MCG/ACT nasal spray 1 spray by Nasal route every morning 4 each 5  rosuvastatin (CRESTOR) 5 MG tablet 1 tablet Orally Once a day for 90 days 90 tablet 1    pantoprazole (PROTONIX) 40 MG tablet Take 1 tablet by mouth daily 90 tablet 1    NIACIN PO Take 1 tablet by mouth in the morning and at bedtime        amLODIPine (NORVASC) 5 MG tablet 1 tablet Orally Once a day for 90 days (Patient taking differently: Take 1 tablet by mouth every morning 1 tablet Orally Once a day for 90 days) 90 tablet 3    losartan-hydroCHLOROthiazide (HYZAAR) 100-25 MG per tablet TAKE 1 TABLET BY MOUTH EVERY DAY FOR 90 DAYS (Patient taking differently: Take 1 tablet by mouth every morning) 90 tablet 3    metoprolol succinate (TOPROL XL) 25 MG extended release tablet TAKE 1 TABLET BY MOUTH EVERY DAY FOR 90 DAYS (Patient taking differently: Take 1 tablet by mouth every morning) 90 tablet 3    aspirin 81 MG EC tablet Take 1 tablet by mouth daily        EPINEPHrine (EPIPEN) 0.3 MG/0.3ML SOAJ injection Inject 0.3 mLs into the muscle as needed        Cholecalciferol (VITAMIN D) 50 MCG (2000 UT) CAPS capsule Take 1 capsule by mouth daily        spironolactone (ALDACTONE) 25 MG tablet Take 0.5 tablets by mouth every other day          No current facility-administered medications for this visit.          Allergies:        Allergies   Allergen Reactions    Latex Rash    Sulfa Antibiotics Headaches    Adhesive Tape Rash    Sulfur Rash      Social History:   Social History               Socioeconomic History    Marital status: Widowed       Spouse name: Not on file    Number of children: Not on file    Years of education: Not on file    Highest education level: Not on file   Occupational History    Not on file   Tobacco Use    Smoking status: Former       Packs/day: 0.50       Years: 40.00       Pack years: 20.00       Types: Cigarettes       Quit date: 2       Years since quitting: 32.6    Smokeless tobacco: Never    Tobacco comments:       Patient counselled on the dangers of tobacco 12/06/2019, Cessation: Not applicable    Vaping Use    Vaping Use: Never used   Substance and Sexual Activity    Alcohol use: Yes       Alcohol/week: 5.0 standard drinks       Types: 5 Glasses of wine per week       Comment: 5 glasses of wine weekly    Drug use: Never    Sexual activity: Not Currently   Other Topics Concern    Not on file   Social History Narrative    Not on file      Social Determinants of Health          Financial Resource Strain: Not on file   Food Insecurity: Not on file   Transportation Needs: Not on file  Physical Activity: Insufficiently Active    Days of Exercise per Week: 4 days    Minutes of Exercise per Session: 30 min   Stress: Not on file   Social Connections: Not on file   Intimate Partner Violence: Not on file   Housing Stability: Not on file         Family History:   Family History         Family History   Problem Relation Age of Onset    Elevated Lipids Mother      Coronary Art Dis Mother      Hypertension Mother      Breast Cancer Mother      Hypertension Father      Esophageal Cancer Father      Coronary Art Dis Father      Elevated Lipids Father      Hypertension Sister      Elevated Lipids Sister      Elevated Lipids Brother      Hypertension Brother              Review of Systems    Gastrointestinal:         Gas/Flatal incontinence   All other systems reviewed and are negative.      BP 110/70   Wt 71 kg (156 lb 9.6 oz)   BMI 27.74 kg/m    Physical Exam  HENT:      Head: Normocephalic and atraumatic.   Cardiovascular:      Rate and Rhythm: Normal rate.      Pulses: Normal pulses.   Pulmonary:      Effort: Pulmonary effort is normal.   Abdominal:      General: Abdomen is flat.   Genitourinary:     General: Normal vulva.      Comments:   POP-Q Examination:  Aa -2 Ba -2 C -6  GH 5 PB 4 TVL 8  Ap0 Bp 0 D     Kegel strength 2-3 out of 5 with sustained squeeze  Levator ani nontender  Skin:     General: Skin is dry.   Neurological:      General: No focal deficit present.      Mental Status: She is alert and oriented to person, place, and time.   Psychiatric:         Mood and Affect: Mood normal.         Behavior: Behavior normal.         On this date 12/18/2021 I have spent 25 minutes reviewing previous notes, test results and face to face with the patient discussing the diagnosis and importance of compliance with the treatment plan as well as documenting on the day of the visit.      An electronic signature was used to authenticate this note.    --Shelda Altes, MD

## 2021-12-18 NOTE — Assessment & Plan Note (Signed)
Symptoms resulting in anal incontinence related to pelvic floor muscle weakness and prolapse.  Refer for pelvic floor physical therapy and discussed diet behavior modification

## 2021-12-30 ENCOUNTER — Encounter

## 2021-12-31 ENCOUNTER — Encounter

## 2022-01-01 NOTE — Telephone Encounter (Signed)
Order has medication warning when attempting to fill requiring override reason. Per protocol/nurse discretion sent to practice for provider discretion/refill.

## 2022-01-01 NOTE — Telephone Encounter (Signed)
TE to [place].  Rx set up to send.  LOV: 10/02/21  NOV: 10/07/22

## 2022-01-02 MED ORDER — LOSARTAN POTASSIUM-HCTZ 100-25 MG PO TABS
100-25 MG | ORAL_TABLET | Freq: Every day | ORAL | 3 refills | Status: DC
Start: 2022-01-02 — End: 2022-12-28

## 2022-01-03 ENCOUNTER — Ambulatory Visit: Admit: 2022-01-03 | Discharge: 2022-01-03 | Payer: MEDICARE | Attending: Internal Medicine | Primary: Family Medicine

## 2022-01-03 DIAGNOSIS — I350 Nonrheumatic aortic (valve) stenosis: Secondary | ICD-10-CM

## 2022-01-03 NOTE — Patient Instructions (Signed)
Echo in Feb 2024  Follow up in 1 year

## 2022-01-03 NOTE — Progress Notes (Signed)
Date:  January 03, 2022  Patient name: Jodi Powell  Date of Birth: Jun 03, 1950          REASON FOR CLINIC VISIT:         Routine Follow Up     HISTORY OF PRESENT ILLNESS        The patient is here for routine follow-up.  Since last seen she has been doing well except unfortunately she lost her twin sister to complications of COVID and cardiac issues.  His heart system was actually a patient of Dr. Dudley Major and some of her other cardiologist.  She also lost her brother-in-law.  She denies any chest pain on exertion no shortness breath no PND orthopnea lower extremity edema.  Echocardiogram showed stable valve functioning very well.    PAST HISTORY          Past Medical History:   Past Medical History:   Diagnosis Date    : MM-03/20/20 , DXA- , EKG-11/29/19-Dr. Ronnita Paz     Allergies     cats, mold, mildew, dust    Chronic cough     Cystocele with rectocele     Hyperlipidemia     Hypertension     Recurrent gastrointestinal hemorrhage     AV malformations    S/P aortic valve replacement with bioprosthetic valve 2011    Dr Vennie Homans       02/09/2020 aortic valve replacement TAVR    Skin problem     Derm: Dr Cari Caraway    Thyroid nodule 12/20/2019    Right        Past Surgical History:    Past Surgical History:   Procedure Laterality Date    AORTIC VALVE REPLACEMENT  06/2009    AORTIC VALVE REPLACEMENT  02/09/2020    Dr. Harrington Challenger    APPENDECTOMY      CARDIAC CATHETERIZATION      CARDIAC CATHETERIZATION  01/2020    Dr. Margo Aye    CARDIAC VALVE REPLACEMENT      COLONOSCOPY      HYSTERECTOMY (CERVIX STATUS UNKNOWN)      HYSTERECTOMY, VAGINAL      PERINEAL SURGERY N/A 09/03/2021    PERINEAL REPAIR performed by Jama Flavors, MD at [facility] MAIN OR    TAH AND BSO (CERVIX REMOVED)      oopherectomy due to ovarian torsion.    UPPER GASTROINTESTINAL ENDOSCOPY  12/2019    Dr. Vennie Homans    VAGINA SURGERY N/A 09/03/2021    VAGINAL REPAIR ANTERIOR AND POSTERIOR VAGINAL SUSPENSION performed by Jama Flavors, MD at [facility]  09/03/2021        Social History:    Social History     Tobacco Use    Smoking status: Former     Packs/day: 0.50     Years: 40.00     Additional pack years: 0.00     Total pack years: 20.00     Types: Cigarettes     Quit date: 1991     Years since quitting: 32.9    Smokeless tobacco: Never    Tobacco comments:     Patient counselled on the dangers of tobacco 12/06/2019, Cessation: Not applicable    Vaping Use    Vaping Use: Never used   Substance Use Topics    Alcohol use: Yes     Alcohol/week: 5.0 standard drinks of alcohol     Types: 5 Glasses of wine per week  Comment: 5 glasses of wine weekly    Drug use: Never        Family History:   Family History   Problem Relation Age of Onset    Elevated Lipids Mother     Coronary Art Dis Mother     Hypertension Mother     Breast Cancer Mother     Hypertension Father     Esophageal Cancer Father     Coronary Art Dis Father     Elevated Lipids Father     Hypertension Sister     Elevated Lipids Sister     Elevated Lipids Brother     Hypertension Brother         Allergies:    Allergies   Allergen Reactions    Latex Rash    Sulfa Antibiotics Headaches    Adhesive Tape Rash    Sulfur Rash        Home Medications:    Current Outpatient Medications   Medication Sig Dispense Refill    losartan-hydroCHLOROthiazide (HYZAAR) 100-25 MG per tablet Take 1 tablet by mouth daily 90 tablet 3    estradiol (ESTRACE VAGINAL) 0.1 MG/GM vaginal cream Place 1 g vaginally once a week 42.5 g 3    fluticasone (FLONASE) 50 MCG/ACT nasal spray 1 spray by Nasal route every morning 4 each 5    rosuvastatin (CRESTOR) 5 MG tablet 1 tablet Orally Once a day for 90 days 90 tablet 1    pantoprazole (PROTONIX) 40 MG tablet Take 1 tablet by mouth daily 90 tablet 1    NIACIN PO Take 1 tablet by mouth in the morning and at bedtime      amLODIPine (NORVASC) 5 MG tablet 1 tablet Orally Once a day for 90 days (Patient taking differently: Take 1 tablet by mouth every morning 1 tablet Orally Once a day  for 90 days) 90 tablet 3    metoprolol succinate (TOPROL XL) 25 MG extended release tablet TAKE 1 TABLET BY MOUTH EVERY DAY FOR 90 DAYS (Patient taking differently: Take 1 tablet by mouth every morning) 90 tablet 3    aspirin 81 MG EC tablet Take 1 tablet by mouth daily      EPINEPHrine (EPIPEN) 0.3 MG/0.3ML SOAJ injection Inject 0.3 mLs into the muscle as needed      Cholecalciferol (VITAMIN D) 50 MCG (2000 UT) CAPS capsule Take 1 capsule by mouth daily      spironolactone (ALDACTONE) 25 MG tablet Take 0.5 tablets by mouth every other day       No current facility-administered medications for this visit.        REVIEW OF SYSTEMS      Review of Systems   Constitutional:  Negative for chills, fatigue, fever and unexpected weight change.   HENT:  Negative for hearing loss, nosebleeds, sore throat, tinnitus and voice change.    Eyes:  Negative for visual disturbance.   Respiratory:  Positive for cough. Negative for apnea, chest tightness, shortness of breath and wheezing.    Cardiovascular:  Negative for chest pain, palpitations and leg swelling.   Gastrointestinal:  Negative for anal bleeding, blood in stool, constipation and diarrhea.   Endocrine: Negative for polydipsia.   Genitourinary:  Negative for difficulty urinating, dysuria, hematuria and urgency.   Musculoskeletal:  Negative for myalgias.   Skin:  Negative for rash and wound.   Neurological:  Negative for dizziness, syncope, weakness, light-headedness and numbness.   Hematological:  Does not bruise/bleed easily.   Psychiatric/Behavioral:  Negative for sleep disturbance.           PHYSICAL EXAM            BP 116/76 (Site: Left Upper Arm, Position: Sitting, Cuff Size: Small Adult)   Pulse 66   Resp 16   Ht 1.613 m (5' 3.5")   Wt 72.9 kg (160 lb 12.8 oz)   SpO2 99%   BMI 28.04 kg/m      Physical Exam  Constitutional:       General: She is not in acute distress.     Appearance: Normal appearance. She is not ill-appearing or diaphoretic.   HENT:      Head:  Normocephalic and atraumatic.      Nose: No congestion.      Mouth/Throat:      Mouth: Mucous membranes are moist.   Neck:      Vascular: No carotid bruit.   Cardiovascular:      Rate and Rhythm: Normal rate and regular rhythm.      Heart sounds: No murmur heard.     No friction rub. No gallop.   Pulmonary:      Effort: Pulmonary effort is normal. No respiratory distress.      Breath sounds: Normal breath sounds. No stridor. No wheezing, rhonchi or rales.   Chest:      Chest wall: No tenderness.   Abdominal:      General: There is no distension.      Tenderness: There is no abdominal tenderness.   Musculoskeletal:         General: No swelling.      Right lower leg: No edema.      Left lower leg: No edema.   Skin:     General: Skin is warm and dry.   Neurological:      General: No focal deficit present.      Mental Status: She is alert. Mental status is at baseline.   Psychiatric:         Mood and Affect: Mood normal.              DIAGNOSTIC STUDIES        NT Pro-BNP   Date Value Ref Range Status   02/23/2020 237 (H) 0 - 125 pg/mL Final     Comment:     Performing Lab:  [id number] Pending          ECHO: 03/01/2021   Normal LV size, thickness, function EF>55%.   Diastolic studies are inconclusive.   Mild bi-atrial enlargement.   TAVR noted in aortic position, functioning well. No AS/AI, mean gradient 10mmHg.   Mild MR, moderate TR.   Borderline PA pressures.        ASSESSMENT / RECOMMENDATIONS      IMPRESSION:   S/P Valve (TAVR) in valve 02/09/2020 (23mm Edward Sapien valve, Dr. 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

## 2022-02-04 NOTE — Telephone Encounter (Signed)
Already noted in chart

## 2022-02-12 ENCOUNTER — Encounter
Admit: 2022-02-12 | Discharge: 2022-02-12 | Payer: MEDICARE | Attending: Obstetrics & Gynecology | Primary: Family Medicine

## 2022-02-12 DIAGNOSIS — N816 Rectocele: Secondary | ICD-10-CM

## 2022-02-12 NOTE — Assessment & Plan Note (Signed)
Discussed treatment options for recurrent vaginal prolapse to include reconstructive repair with interpositional graft, i.e. sacrocolpopexy versus colpocleisis and vaginal closure.  After discussion and counseling she is desiring vaginal closure procedure.  Literature was shared today and we will plan to schedule for preop counseling

## 2022-02-12 NOTE — Progress Notes (Signed)
Jodi Powell (DOB:  30-Aug-1950) is a 72 y.o. female. Established patient here for evaluation of the following chief complaint(s):  No chief complaint on file.      PCP: Alexander Bergeron, MD   Referring provider: No referring provider defined for this encounter.     ASSESSMENT/PLAN:  1. Rectocele  Assessment & Plan:     2. Vaginal vault prolapse after hysterectomy  Assessment & Plan:  Discussed treatment options for recurrent vaginal prolapse to include reconstructive repair with interpositional graft, i.e. sacrocolpopexy versus colpocleisis and vaginal closure.  After discussion and counseling she is desiring vaginal closure procedure.  Literature was shared today and we will plan to schedule for preop counseling      No follow-ups on file.    SUBJECTIVE/OBJECTIVE:  HPI  [name] presents for a follow up visit for pelvic organ prolapse.  She is completed pelvic floor physical therapy reports minimal improvement in pressure symptoms he still has a bulge vaginally and feels that her vagina is bulging and ballooning by her description.  Bowels are moving 5-6 times per week.  She denies any incontinence.  Discussed treatment alternatives to include conservative management with continued pelvic floor physical therapy as well and possible surgery.  Surgical alternatives to include repair with interpositional graft and possible colopexy versus colpocleisis was reviewed.  She does not desire future coital activity and after discussion regarding recovery and surgical risk she is desiring vaginal closure.  Review of Systems   Genitourinary:         Vaginal pressure and prolapse   All other systems reviewed and are negative.      There were no vitals taken for this visit.   Physical Exam  HENT:      Head: Normocephalic and atraumatic.   Cardiovascular:      Rate and Rhythm: Normal rate.      Pulses: Normal pulses.   Pulmonary:      Effort: Pulmonary effort is normal.   Abdominal:      General: Abdomen is flat.      Comments:  Incisions intact and healing well   Genitourinary:     General: Normal vulva.      Comments:   POP-Q Examination:  Aa -2   Ba -2   C -7  GH4     PB 4    TVL 8  Ap-1    Bp 0    D-   On this date 02/12/2022 I have spent 30 minutes reviewing previous notes, test results and face to face with the patient discussing the diagnosis and importance of compliance with the treatment plan as well as documenting on the day of the visit.  Surgical posting  Location: [facility] - X  Other:   Status: Routine  (X)  Urgent ( )   Surgeon: Joycelyn Das, MD  First Assistant: No  Co-surgeon: No  Diagnosis: 1.  Vaginal vault prolapse 2.  Enterocele  Procedure: 1.  Colpocleisis 2.  Anterior repair 3.  Posterior pair  Patient position: Lithotomy  Procedure time request: 1hr  Anesthesia type: General   INpatient  X    Postop visit: 1 to 2 weeks  Preop visit: In office X  Comments/Special Needs:        An electronic signature was used to authenticate this note.    --Jama Flavors, MD

## 2022-02-13 ENCOUNTER — Encounter

## 2022-02-18 ENCOUNTER — Encounter

## 2022-02-18 NOTE — Telephone Encounter (Signed)
Pt called she wants to  scheduled for surgery before she leaves to go out of town.

## 2022-02-28 ENCOUNTER — Ambulatory Visit: Payer: MEDICARE | Primary: Family Medicine

## 2022-02-28 DIAGNOSIS — I350 Nonrheumatic aortic (valve) stenosis: Secondary | ICD-10-CM

## 2022-02-28 LAB — ECHO (TTE) COMPLETE (PRN CONTRAST/BUBBLE/STRAIN/3D)
Aortic Arch: 2.4 cm
Aortic Root: 3.1 cm
E/E' Ratio (Averaged): 9.52
EF BP: 68 % (ref 55–100)
Est. RA Pressure: 3 mm[Hg]
IVSd: 0.8 cm (ref 0.6–0.9)
LA Area 4C: 18.8 cm2
LV E' Septal Velocity: 7 cm/s
LV Ejection Fraction A2C: 59 %
LV Ejection Fraction A4C: 75 %
LV Mass 2D: 109.4 g (ref 67–162)
LVIDs Index: 1.36 cm/m2
LVOT:AV VTI Index: 0.39
LVPWd: 0.8 cm (ref 0.6–0.9)
MR Peak Gradient: 85 mm[Hg]

## 2022-02-28 NOTE — Other (Signed)
[name], Your Echo looks good. Your aortic valve is working well. Kind regards, Dr. Vennie Homans

## 2022-03-02 LAB — ECHO (TTE) COMPLETE (PRN CONTRAST/BUBBLE/STRAIN/3D)
AV Area by Peak Velocity: 1.2 cm2
AV Area by VTI: 1.2 cm2
AV Mean Gradient: 13 mmHg
AV Mean Velocity: 1.7 m/s
AV Peak Gradient: 32 mmHg
AV Peak Velocity: 2.8 m/s
AV VTI: 62 cm
AV Velocity Ratio: 0.36
AVA/BSA Peak Velocity: 0.7 cm2/m2
AVA/BSA VTI: 0.7 cm2/m2
Ao Root Index: 1.76 cm/m2
Ascending Aorta Index: 1.65 cm/m2
Ascending Aorta: 2.9 cm
Body Surface Area: 1.8 m2
Descending Aorta Index: 1.36 cm/m2
Descending Aorta: 2.4 cm
E/E' Lateral: 8.33
Fractional Shortening 2D: 45 % (ref 28–44)
IVC Expiration: 1.4 cm
LA Diameter: 3.8 cm
LA Major Axis: 5.8 cm
LA Size Index: 2.16 cm/m2
LA Volume Index MOD A4C: 28 mL/m2 (ref 16–34)
LA Volume MOD A4C: 49 mL (ref 22–52)
LA/AO Root Ratio: 1.23
LV E' Lateral Velocity: 9 cm/s
LV EDV A2C: 46 mL
LV EDV A4C: 73 mL
LV EDV Index A2C: 26 mL/m2
LV EDV Index A4C: 41 mL/m2
LV ESV A2C: 19 mL
LV ESV A4C: 19 mL
LV ESV Index A2C: 11 mL/m2
LV ESV Index A4C: 11 mL/m2
LV Mass 2D Index: 62.2 g/m2 (ref 43–95)
LV RWT Ratio: 0.36
LVIDd Index: 2.5 cm/m2
LVIDd: 4.4 cm (ref 3.9–5.3)
LVIDs: 2.4 cm
LVOT Area: 3.1 cm2
LVOT Diameter: 2 cm
LVOT Mean Gradient: 2 mmHg
LVOT Peak Gradient: 4 mm[Hg]
LVOT Peak Velocity: 1 m/s
LVOT SV: 76 mL
LVOT Stroke Volume Index: 43.2 mL/m2
LVOT VTI: 24.2 cm
MR Peak Velocity: 4.6 m/s
MR VTI: 164 cm
MV A Velocity: 1.08 m/s
MV Area by VTI: 2 cm2
MV E Velocity: 0.75 m/s
MV E Wave Deceleration Time: 441 ms
MV E/A: 0.69
MV Max Velocity: 1.3 m/s
MV Mean Gradient: 2 mmHg
MV Mean Velocity: 0.6 m/s
MV Peak Gradient: 6 mm[Hg]
MV VTI: 38.5 cm
MV:LVOT VTI Index: 1.59
RA Area 4C: 18.9 cm2
RVIDd: 3.8 cm
RVOT Area: 7.5 cm2
RVOT Diameter: 3.1 cm
RVSP: 34 mm[Hg]
TAPSE: 2.4 cm (ref 1.7–?)
TR Max Velocity: 2.8 m/s
TR Peak Gradient: 38 mm[Hg]

## 2022-03-19 ENCOUNTER — Encounter

## 2022-03-19 MED ORDER — PANTOPRAZOLE SODIUM 40 MG PO TBEC
40 MG | ORAL_TABLET | Freq: Every day | ORAL | 2 refills | Status: AC
Start: 2022-03-19 — End: 2022-12-28

## 2022-03-19 MED ORDER — METOPROLOL SUCCINATE ER 25 MG PO TB24
25 | ORAL_TABLET | Freq: Every day | ORAL | 2 refills | Status: AC
Start: 2022-03-19 — End: 2022-07-01

## 2022-03-19 NOTE — Telephone Encounter (Signed)
TE to [place].  Rx set up to send, #90 w/ 2 refills.  LOV: 10/02/21  NOV: 10/07/22

## 2022-03-19 NOTE — Telephone Encounter (Signed)
TE to [place].  Rx set up to send for #90 w/ 2 refills.  LOV: 10/02/21  NOV: 10/07/22

## 2022-03-19 NOTE — Telephone Encounter (Signed)
When attempting to copy previous sig to fill required sig elements are missing. Per protocol/nurse discretion sent to practice for provider discretion/new Rx order.

## 2022-03-19 NOTE — Telephone Encounter (Signed)
Order has medication warning when attempting to fill. Per protocol/nurse discretion sent to practice for provider discretion/refill.

## 2022-03-27 ENCOUNTER — Encounter

## 2022-03-27 MED ORDER — ROSUVASTATIN CALCIUM 5 MG PO TABS
5 | ORAL_TABLET | ORAL | 2 refills | Status: AC
Start: 2022-03-27 — End: ?

## 2022-03-27 NOTE — Telephone Encounter (Signed)
TE to [place].  Please advise, we have not sent Rx Spironolactone 25 mg tabs before.  Pt takes 0.5 tab every other day, Dx: ??  LOV: 10/02/21  NOV: 10/07/22

## 2022-03-27 NOTE — Telephone Encounter (Signed)
TE to [place].  Rx set up for #90, w/2 refills.  LOV: 10/02/21  NOV: 10/07/22

## 2022-03-28 MED ORDER — SPIRONOLACTONE 25 MG PO TABS
25 MG | ORAL_TABLET | ORAL | 2 refills | Status: AC
Start: 2022-03-28 — End: 2022-07-22

## 2022-03-28 NOTE — Telephone Encounter (Signed)
Patient is requesting refill -     What pharmacy would you like this sent to? [id number] [address]   Have there been any medication changes since your last visit? No  "Do you have your prescription bottle with you to read out the information to me?" No    Last visit date with provider: 01/03/22  Next visit date with provider: 12/30/22  Medication: sprionolactone  Dosage: '25mg'$   Frequency ("how often do you take it?"): 1/5 tab every other day  Quantity: 90 days       Office use - Is this a controlled?  If yes, was SCRIPTS checked:

## 2022-03-28 NOTE — Telephone Encounter (Signed)
Med verified per office visit 01/03/22. Pt has appt scheduled 12/30/22.    Jodi Powell

## 2022-03-29 ENCOUNTER — Ambulatory Visit: Payer: MEDICARE | Primary: Family Medicine

## 2022-03-29 DIAGNOSIS — Z1231 Encounter for screening mammogram for malignant neoplasm of breast: Secondary | ICD-10-CM

## 2022-03-29 DIAGNOSIS — E559 Vitamin D deficiency, unspecified: Secondary | ICD-10-CM

## 2022-04-09 NOTE — Telephone Encounter (Signed)
Called patient to reschedule appointment on 04/23/22, Dr. Lovena Le will be out of the office that afternoon.

## 2022-04-23 ENCOUNTER — Encounter: Attending: Obstetrics & Gynecology | Primary: Family Medicine

## 2022-04-30 ENCOUNTER — Encounter
Admit: 2022-04-30 | Discharge: 2022-04-30 | Payer: MEDICARE | Attending: Obstetrics & Gynecology | Primary: Family Medicine

## 2022-04-30 DIAGNOSIS — N993 Prolapse of vaginal vault after hysterectomy: Secondary | ICD-10-CM

## 2022-04-30 NOTE — Progress Notes (Unsigned)
Jodi Powell (DOB:  02-27-1950) is a 72 y.o. female,Established patient, here for evaluation of the following chief complaint(s):  Follow up Pre-Op     PCP: Malena Edman, MD     ASSESSMENT/PLAN:  {There are no diagnoses linked to this encounter. (Refresh or delete this SmartLink)}  No follow-ups on file.    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 She was last seen on 02/12/2022 and reports her symptoms are {improved/no change/worse/[phone]::"stable"}. Patient is scheduled for a vaginal colpocleisis and anterior/posterior repair on 07/01/22. Her goals for this visit include ***.    Review of Systems    There were no vitals taken for this visit.   Physical Exam    On this date 04/30/2022 I have spent *** minutes reviewing previous notes, test results and face to face with the patient discussing the diagnosis and importance of compliance with the treatment plan as well as documenting on the day of the visit.      An electronic signature was used to authenticate this note.    --Gilford Silvius, RN

## 2022-04-30 NOTE — Assessment & Plan Note (Signed)
Reviewed plan to proceed with colpocleisis  Surgery date July 01, 2022  Follow-up for preop consultation Jun 25, 2022  Literature reviewed as to what to expect with surgery and encouraged her to call for [facility] symptom development related to bladder function

## 2022-05-27 ENCOUNTER — Ambulatory Visit: Admit: 2022-05-27 | Discharge: 2022-05-27 | Payer: MEDICARE | Attending: Family | Primary: Family Medicine

## 2022-05-27 ENCOUNTER — Inpatient Hospital Stay: Admit: 2022-05-27 | Payer: MEDICARE | Primary: Family Medicine

## 2022-05-27 DIAGNOSIS — M79644 Pain in right finger(s): Secondary | ICD-10-CM

## 2022-05-27 DIAGNOSIS — S66411A Strain of intrinsic muscle, fascia and tendon of right thumb at wrist and hand level, initial encounter: Secondary | ICD-10-CM

## 2022-05-27 NOTE — Progress Notes (Signed)
Jodi Powell (DOB:  1950-12-16) is a 72 y.o. female, presents for evaluation of the following chief complaint(s):  Other (Possible sprained rt thumb x 1 week-discolored,pain with movement)        Subjective   SUBJECTIVE/OBJECTIVE:  Pleasant 73 yo female presents to clinic w/ c/o right thumb soreness, bruising, swelling x 1 week after sitting on right thumb when climbing into car. Denies numbness/tingling. States ROM, swelling, bruising, and soreness has improved since injury, but is concerned that it has persisted this long.        BP 130/70   Pulse 62   Temp 98.1 F (36.7 C)   Wt 75.8 kg (167 lb)   SpO2 96%   BMI 29.58 kg/m      Allergies   Allergen Reactions    Latex Rash    Sulfa Antibiotics Headaches    Adhesive Tape Rash    Sulfur Rash      Current Outpatient Medications   Medication Sig Dispense Refill    spironolactone (ALDACTONE) 25 MG tablet Take 0.5 tablets by mouth every other day 30 tablet 2    rosuvastatin (CRESTOR) 5 MG tablet 1 tablet Orally Once a day for 90 days 90 tablet 2    metoprolol succinate (TOPROL XL) 25 MG extended release tablet Take 1 tablet by mouth daily 90 tablet 2    pantoprazole (PROTONIX) 40 MG tablet Take 1 tablet by mouth daily 90 tablet 2    losartan-hydroCHLOROthiazide (HYZAAR) 100-25 MG per tablet Take 1 tablet by mouth daily 90 tablet 3    estradiol (ESTRACE VAGINAL) 0.1 MG/GM vaginal cream Place 1 g vaginally once a week 42.5 g 3    fluticasone (FLONASE) 50 MCG/ACT nasal spray 1 spray by Nasal route every morning 4 each 5    NIACIN PO Take 1 tablet by mouth in the morning and at bedtime      amLODIPine (NORVASC) 5 MG tablet 1 tablet Orally Once a day for 90 days (Patient taking differently: Take 1 tablet by mouth every morning 1 tablet Orally Once a day for 90 days) 90 tablet 3    aspirin 81 MG EC tablet Take 1 tablet by mouth daily      EPINEPHrine (EPIPEN) 0.3 MG/0.3ML SOAJ injection Inject 0.3 mLs into the muscle as needed      Cholecalciferol (VITAMIN D) 50 MCG  (2000 UT) CAPS capsule Take 1 capsule by mouth daily       No current facility-administered medications for this visit.      Past Medical History:   Diagnosis Date    : MM-03/20/20 , DXA- , EKG-11/29/19-Dr. Pinto     Allergies     cats, mold, mildew, dust    Breast cyst     Chronic cough     Cystocele with rectocele     Hyperlipidemia     Hypertension     Recurrent gastrointestinal hemorrhage     AV malformations    S/P aortic valve replacement with bioprosthetic valve 2011    Dr Claris Gladden       02/09/2020 aortic valve replacement TAVR    Skin problem     Derm: Dr Marcell Barlow    Thyroid nodule 12/20/2019    Right      Past Surgical History:   Procedure Laterality Date    AORTIC VALVE REPLACEMENT  06/2009    AORTIC VALVE REPLACEMENT  02/09/2020    Dr. Tenny Craw    APPENDECTOMY      BREAST BIOPSY  2001    Benign 2001    CARDIAC CATHETERIZATION      CARDIAC CATHETERIZATION  01/2020    Dr. Aggie Cosier    CARDIAC VALVE REPLACEMENT      COLONOSCOPY      HYSTERECTOMY, VAGINAL  1988    PERINEAL SURGERY N/A 09/03/2021    PERINEAL REPAIR performed by Shelda Altes, MD at [facility] MAIN OR    TAH AND BSO (CERVIX REMOVED)      oopherectomy due to ovarian torsion.    UPPER GASTROINTESTINAL ENDOSCOPY  12/2019    Dr. Claris Gladden    VAGINA SURGERY N/A 09/03/2021    VAGINAL REPAIR ANTERIOR AND POSTERIOR VAGINAL SUSPENSION performed by Shelda Altes, MD at [facility] MAIN OR    VAGINAL PROLAPSE REPAIR  09/03/2021      Family History   Problem Relation Age of Onset    Elevated Lipids Mother     Coronary Art Dis Mother     Hypertension Mother     Breast Cancer Mother     Hypertension Father     Esophageal Cancer Father     Coronary Art Dis Father     Elevated Lipids Father     Hypertension Sister     Elevated Lipids Sister     Elevated Lipids Brother     Hypertension Brother       Social History     Occupational History    Not on file   Tobacco Use    Smoking status: Former     Current packs/day: 0.00     Types: Cigarettes     Quit date: 1991     Years since  quitting: 33.3    Smokeless tobacco: Never    Tobacco comments:     Patient counselled on the dangers of tobacco 12/06/2019, Cessation: Not applicable    Vaping Use    Vaping Use: Never used   Substance and Sexual Activity    Alcohol use: Yes     Alcohol/week: 5.0 standard drinks of alcohol     Types: 5 Glasses of wine per week     Comment: 5 glasses of wine weekly    Drug use: Never    Sexual activity: Not Currently          Review of Systems   Musculoskeletal:  Positive for arthralgias.   All other systems reviewed and are negative.         Objective   Physical Exam  Vitals and nursing note reviewed.   Constitutional:       Appearance: Normal appearance.   HENT:      Head: Normocephalic and atraumatic.      Mouth/Throat:      Mouth: Mucous membranes are moist.      Pharynx: Oropharynx is clear.   Eyes:      Extraocular Movements: Extraocular movements intact.      Conjunctiva/sclera: Conjunctivae normal.      Pupils: Pupils are equal, round, and reactive to light.   Musculoskeletal:      Right hand: Swelling, tenderness and bony tenderness present. No lacerations. Normal range of motion. Normal strength. Normal sensation. There is no disruption of two-point discrimination. Normal capillary refill. Normal pulse.      Comments: Ttp over proximal phalanx of right thumb and posterior metacarpal. Diffuse dark blue ecchymosis over posterior thumb and anterior wrist. Mild edema over posterior thumb proximal phalanx. Skin is normothermic and intact. No decrease in ROM/strength w/ or w/o resistance.  Skin:     General: Skin is warm and dry.      Capillary Refill: Capillary refill takes less than 2 seconds.   Neurological:      General: No focal deficit present.      Mental Status: She is alert and oriented to person, place, and time.   Psychiatric:         Mood and Affect: Mood normal.         Behavior: Behavior normal.         Thought Content: Thought content normal.         Judgment: Judgment normal.        Right thumb  xr ordered and will call pt w/ results and treatment recommendations. Will treat as thumb strain for now. Tylenol 1000 mg po q4-6 hrs prn, ibuprofen 400 mg po q6-8 hrs prn for discomfort. Ice, rest. Avoid strenuous exercise and heavy lifting. Reviewed typical timeline of strains that potentially may last several weeks. F/u if symptoms persist or worsen. ER prec for severely worsening symptoms         ASSESSMENT/PLAN:  1. Strain of intrinsic muscle of right thumb  2. Pain of right thumb  -     XR FINGER RIGHT (MIN 2 VIEWS); Future      Return if symptoms worsen or fail to improve.           An electronic signature was used to authenticate this note.    --Gabriel Rainwater, APRN - NP

## 2022-06-20 NOTE — Progress Notes (Signed)
Pre Procedure Patient Instructions     Procedure Location hospital:[facility]: [address] [place] - Drop off at Outpatient Services to the left of the main hospital entrance and proceed to the gold elevator on your left (past the security desk) to the 2nd floor Surgery Reception desk.   Procedure Date 07/01/22  Arrival Time  1230    Your doctor determines your scheduled start time.  Depending on the facility where your procedure is taking place and the scheduled start time, you may be required to arrive up to 2 hours prior.  This is to allow time for registration, your preop assessment, any day of procedure testing and to meet with your care teams members.  This also allows your procedure to be performed earlier should there be a cancellation that day.  We appreciate your patience as we work to provide you with excellent service.    Medications:  Medication to be taken the morning of surgery with a few sips of water only: AMLODIPINE, METOPROLOL, ROSUVASTATIN, PANTOPRAZOLE; IF NEEDED, USE FLUTICASONE, EPI-PEN.  Stop all supplements, vitamins and herbal remedies one week prior to your procedure, unless your doctor told you to continue taking.  Do not take over the counter pain medications except plain Tylenol or Acetaminophen unless your doctor told you to do so.  If you are taking blood thinners, call the doctor performing your procedure and prescribing physician for instructions on when to stop: ASPIRIN.      Procedure Preparation    Diet Restrictions:No food or drink including gum or mints -after midnight      If you have questions on the day of your procedure call [facility] Preop at [phone].    Skin Preparation:  Wash with Hibiclens or an antibacterial soap (e.g. Dial soap) the night before and morning of procedure.    Do not put on any deodorants, lotions, powders, or oils on the day of procedure.  Be sure to put on clean, comfortable loose fitting  clothing.    Other Preparation:  Call your doctor right away if you get any wounds, cuts, scrapes, scabs, rashes, bug bites at or near your procedure site or if you have any fever, cold or flu symptoms    Complete tests for surgery by 06/25/22 at [facility]; [address], [place]; M-F 7a-4:30p; Sat-Sun; 8a-12p; Enter Outpatient Services next to Emergency Dept entrance; You do not need to fast. Bring your picture ID and insurance card. Inform the receptionist you are there for testing before your surgery. Tell them the name of your doctor. If you have any issue, call [phone]          Day of Procedure Patient Instruction:  Do not smoke, vape, chew tobacco, drink alcohol or use recreational drugs on the day of your procedure  Remove all jewelry, piercings, and metal accessories  Do not wear artificial nails and only clear nail polish on natural nails. Nails must be trimmed to fingertip length to make sure the oxygen probe fits properly and to avoid injury  Do not wear artificial eye lashes because they can cause dry eyes, eye scratches, eye infections and allergic reactions  Do not use hair extensions with metal clips or hairstyles near the back of your neck, as it can make it difficult to safely manage your breathing during anesthesia  If your hair is tightly braided or styled in a complex way, it may need to be undone for your safety  Do not wear contacts, tampons, make-up, lotions, creams, powders, fragrances or deodorant  Wear loose fit clothing  Do not bring valuables or money  Bring a copy of your Living Will and/or Medical Durable Power of Attorney if you have one  Bring a list of current medications including name and dosage  Bring a picture ID and insurance card     Bring a storage case for eyeglasses, hearing aids, dentures, etc          If you are going home the same day as your procedure, a support person should accompany you to the facility and must transport you home.  If you plan to take public  transportation of any sort, your support person must accompany you home.  You will need someone to stay with you for 24 hours after your procedure with sedation of any kind.               Comments:     This information was reviewed with you during your Pre-Admission Testing interview and you verbalized understanding. If you have any additional questions please contact [phone].    To pre-register for your procedure please call [phone] Option 1.    For financial questions regarding your procedure at a [facility] facility, please contact [phone].    For financial questions regarding anesthesia at a [facility] facility, please  contact [phone].    For MyChart Patient Portal help please call [phone].

## 2022-06-25 ENCOUNTER — Encounter

## 2022-06-25 ENCOUNTER — Ambulatory Visit
Admit: 2022-06-25 | Discharge: 2022-06-25 | Payer: MEDICARE | Attending: Obstetrics & Gynecology | Primary: Family Medicine

## 2022-06-25 DIAGNOSIS — Z419 Encounter for procedure for purposes other than remedying health state, unspecified: Secondary | ICD-10-CM

## 2022-06-25 LAB — CBC WITH AUTO DIFFERENTIAL
Basophils %: 0.8 % (ref 0.0–2.0)
Basophils Absolute: 0.1 10*3/uL (ref 0.0–0.2)
Eosinophils %: 3.8 % (ref 0.0–7.0)
Eosinophils Absolute: 0.2 10*3/uL (ref 0.0–0.5)
Hematocrit: 43.3 % (ref 34.0–47.0)
Hemoglobin: 14.7 g/dL (ref 11.5–15.7)
Immature Grans (Abs): 0.02 10*3/uL (ref 0.00–0.06)
Immature Granulocytes %: 0.3 % (ref 0.0–0.6)
Lymphocytes Absolute: 1.7 10*3/uL (ref 1.0–3.2)
Lymphocytes: 27.4 % (ref 15.0–45.0)
MCH: 32.6 pg (ref 27.0–34.5)
MCHC: 33.9 g/dL (ref 32.0–36.0)
MCV: 96 fL (ref 81.0–99.0)
MPV: 11.2 fL (ref 7.2–13.2)
Monocytes %: 10.1 % (ref 4.0–12.0)
Monocytes Absolute: 0.6 10*3/uL (ref 0.3–1.0)
NRBC Absolute: 0 10*3/uL (ref 0.000–0.012)
NRBC Automated: 0 % (ref 0.0–0.2)
Neutrophils %: 57.6 % (ref 42.0–74.0)
Neutrophils Absolute: 3.6 10*3/uL (ref 1.6–7.3)
Platelets: 217 10*3/uL (ref 140–440)
RBC: 4.51 x10e6/mcL (ref 3.60–5.20)
RDW: 12.6 % (ref 11.0–16.0)
WBC: 6.3 10*3/uL (ref 3.8–10.6)

## 2022-06-25 LAB — POTASSIUM: Potassium: 3.9 mmol/L (ref 3.5–5.3)

## 2022-06-25 NOTE — Assessment & Plan Note (Signed)
Plan proceed with colpocleisis, anterior and posterior pair  Date of surgery 07/01/2022  Anticipate overnight hospitalization  Reviewed perioperative care plan  Patient deferred prescription for postop pain medicines and desires over-the-counter Tylenol and ibuprofen  Labs: CBC and potassium ordered

## 2022-06-25 NOTE — Progress Notes (Unsigned)
Jodi Powell (DOB:  06/11/1950) is a 72 y.o. female,Established patient, here for evaluation of the following chief complaint(s):  In office Pre-Op appt    PCP: Malena Edman, MD     ASSESSMENT/PLAN:  {There are no diagnoses linked to this encounter. (Refresh or delete this SmartLink)}  No follow-ups on file.    SUBJECTIVE/OBJECTIVE:  [name] presents for a preoperative visit.  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  The planned procedure was discussed and reviewed in context of her clinical history and symptoms.  Both surgical and nonsurgical options were discussed.  Her goals for this visit include discussing procedure and asking and pre operative questions.    Procedure consent:   Her history and examination are reviewed.  Treatment options were discussed to include nonsurgical and surgical intervention.  She is elected to proceed with surgical management for definitive result.  The goals and intent of surgery are to alleviate symptoms and improved pelvic floor function. The surgical risks while rare were reviewed and included but were not not limited to injury to the bowel, bladder, ureter, internal organs; possibility of nerve entrapment and nerve injury; possibility of bleeding with subsequent transfusion were all discussed with the patient at a great length. She was also counseled on the potential for unintended short-term and/or long-term voiding dysfunction, defecatory dysfunction, mesh erosion***, dyspareunia***, recurrent prolapse, recurrent incontinence, as well as the rest of the events despite preventative measures to include DVT, PE, and death.     Review of Systems    There were no vitals taken for this visit.   Physical Exam  Labs/Procedures:  No results found  for this visit on 06/25/22.    On this date 06/25/2022 I have spent *** minutes reviewing previous notes, test results and face to face with the patient discussing the diagnosis and importance of compliance with the treatment plan as well as documenting on the day of the visit.      An electronic signature was used to authenticate this note.    --Gilford Silvius, RN

## 2022-06-30 NOTE — H&P (Signed)
HISTORY AND PHYSICAL             Date: 06/30/2022        Patient Name: Jodi Powell     Date of Birth: April 02, 1950      Age:  72 y.o.    Chief Complaint      "vaginal prolapse"    History Obtained From   patient    History of Present Illness   Ms. Belew presents for scheduled surgery to treat vaginal prolapse.   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 She now presents with early recurrence of prolapse desiring definitive treatment.     The planned procedure was discussed and reviewed in context of her clinical history and symptoms.  Both surgical and nonsurgical options were discussed.  Her goals for this visit include discussing procedure and asking and pre operative questions.     Procedure consent:   Her history and examination are reviewed.  Treatment options were discussed to include nonsurgical and surgical intervention.  She is elected to proceed with surgical management for definitive result.  The goals and intent of surgery are to alleviate symptoms . The surgical risks while rare were reviewed and included but were not not limited to injury to the bowel, bladder, ureter, internal organs; possibility of nerve entrapment and nerve injury; possibility of bleeding with subsequent transfusion were all discussed with the patient. She was also counseled on the potential for unintended short-term and/or long-term voiding dysfunction, defecatory dysfunction, recurrent prolapse, incontinence, as well as the rest of the events despite preventative measures to include DVT, PE, and death. She is aware that the procedure is permanent and the results of the procedure to perform vaginal closure for prolapse results in inability to penetrative vaginal intercourse.      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 Aggie Cosier    COLONOSCOPY  2021    HYSTERECTOMY, VAGINAL  1988    PERINEAL SURGERY N/A 09/03/2021    PERINEAL REPAIR performed by Shelda Altes, MD at [facility] MAIN OR    TONSILLECTOMY  1956    UPPER GASTROINTESTINAL ENDOSCOPY  12/2019    Dr. Claris Gladden    VAGINA SURGERY N/A 09/03/2021    VAGINAL REPAIR ANTERIOR AND POSTERIOR VAGINAL SUSPENSION performed by Shelda Altes, MD at [facility] MAIN OR        Medications Prior to Admission     Prior to  Admission medications    Medication Sig Start Date End Date Taking? Authorizing Provider   niacinamide 500 MG tablet Take 1 tablet by mouth 2 times daily (with meals)   Yes [provider]   spironolactone (ALDACTONE) 25 MG tablet Take 0.5 tablets by mouth every other day  Patient taking differently: Take 0.5 tablets by mouth every other day IN THE MORNING 03/28/22   Megan Mans, MD   rosuvastatin (CRESTOR) 5 MG tablet 1 tablet Orally Once a day for 90 days  Patient taking differently: Take 1 tablet by mouth every morning 1 tablet Orally Once a day for 90 days 03/27/22   Sherlene Shams, NP-C   metoprolol succinate (TOPROL XL) 25 MG extended release tablet Take 1 tablet by mouth daily  Patient taking differently: Take 1 tablet by mouth every morning 03/19/22 06/17/22  Sherlene Shams, NP-C   pantoprazole (PROTONIX) 40 MG tablet Take 1 tablet by mouth daily  Patient taking differently: Take 1 tablet by mouth every  morning 03/19/22   Sherlene Shams, NP-C   losartan-hydroCHLOROthiazide (HYZAAR) 100-25 MG per tablet Take 1 tablet by mouth daily  Patient taking differently: Take 1 tablet by mouth every morning 01/02/22 04/02/22  Sherlene Shams, NP-C   fluticasone (FLONASE) 50 MCG/ACT nasal spray 1 spray by Nasal route every morning  Patient taking differently: 1 spray by Nasal route daily as needed for Allergies 10/02/21   Malena Edman, MD   amLODIPine (NORVASC) 5 MG tablet 1 tablet Orally Once a day for 90 days  Patient taking differently: Take 1 tablet by mouth every morning 1 tablet Orally Once a day for 90 days 08/23/21   Sherlene Shams, NP-C   aspirin 81 MG EC tablet Take 1 tablet by mouth every morning 02/10/20   [provider]   EPINEPHrine (EPIPEN) 0.3 MG/0.3ML SOAJ injection Inject 0.3 mLs into the muscle as needed (ANAPHYLAXIS) 08/22/20   [provider]   Cholecalciferol (VITAMIN D) 50 MCG (2000 UT) CAPS capsule Take 1 capsule by mouth daily    [provider]        Allergies   Latex, Sulfa antibiotics, Seasonal, and Adhesive tape    Social History     Social History       Tobacco History       Smoking Status  Former Smoking Start Date  1969 Quit Date  1991 Average Packs/Day  0.5 packs/day for 22.0 years (11.0 ttl pk-yrs) Smoking Tobacco Type  Cigarettes from 1969 to 1991   Pack Year History     Packs/Day From To Years    0 1991  33.4    0.5 1969 1991 22.0      Smokeless Tobacco Use  Never      Tobacco Comments  Patient counselled on the dangers of tobacco 12/06/2019, Cessation: Not applicable                 Alcohol History       Alcohol Use Status  Yes Drinks/Week  5 Glasses of wine, 0 Cans of beer, 0 Shots of liquor, 0 Drinks containing 0.5 oz of alcohol per week Amount  5.0 standard drinks of alcohol/wk Comment  5 glasses of wine weekly              Drug Use       Drug Use Status  Never              Sexual Activity       Sexually Active  Not Currently                    Family History      Family History   Problem Relation Age of Onset    Elevated Lipids Mother     Coronary Art Dis Mother     Hypertension Mother     Breast Cancer Mother     Hypertension Father     Esophageal Cancer Father     Coronary Art Dis Father     Elevated Lipids Father     Hypertension Sister     Elevated Lipids Sister     Elevated Lipids Brother     Hypertension Brother        Review of Systems   Review of Systems   Genitourinary:         Vaginal prolapse       Physical Exam   Ht 1.6 m (5\' 3" )   Wt 73.5 kg (162 lb)   BMI 28.70 kg/m     Physical Exam  Exam conducted with a chaperone present.   Constitutional:       Appearance: Normal appearance.   Eyes:      Conjunctiva/sclera: Conjunctivae normal.   Cardiovascular:      Rate and Rhythm: Normal rate.   Pulmonary:      Effort: Pulmonary effort is normal.   Abdominal:      General: Abdomen is flat.   Genitourinary:     General: Normal vulva.      Exam position: Lithotomy position.      Comments: Genitourinary:     General: Normal vulva.      Comments:   POP-Q Examination:  Aa -2   Ba -2   C -7  GH4     PB 4    TVL 8  Ap-1    Bp 0    D-     Skin:     General: Skin is warm and dry.   Neurological:      General: No focal deficit present.      Mental Status: She is alert. Mental status is at baseline.         Labs    No results found for this or any previous visit (from the past 24 hour(s)).     Imaging/Diagnostics Last 24 Hours   No results found.    Assessment      Hospital Problems             Last Modified POA    Vaginal enterocele 02/18/2022 Unknown       Plan   Colpocleisis and Anterior/Posterior repair  Anticipate overnight hospitalization with discharge on 65/04/2022  Reviewed surgical goals and perioperative care discussed.     Consultations Ordered:  None    Electronically signed by Shelda Altes, MD on 06/30/22 at 7:31 PM EDT

## 2022-07-01 ENCOUNTER — Observation Stay
Admission: RE | Admit: 2022-07-01 | Discharge: 2022-07-02 | Disposition: A | Discharge status: Home / Self Care | Payer: MEDICARE | Source: Other Acute Inpatient Hospital | Admitting: Obstetrics & Gynecology

## 2022-07-01 DIAGNOSIS — Z79899 Other long term (current) drug therapy: Secondary | ICD-10-CM

## 2022-07-01 DIAGNOSIS — N993 Prolapse of vaginal vault after hysterectomy: Secondary | ICD-10-CM

## 2022-07-01 MED ORDER — LABETALOL HCL 5 MG/ML IV SOLN
5 | INTRAVENOUS | Status: DC | PRN
Start: 2022-07-01 — End: 2022-07-01

## 2022-07-01 MED ORDER — DIPHENHYDRAMINE HCL 50 MG/ML IJ SOLN
50 | Freq: Once | INTRAMUSCULAR | Status: DC | PRN
Start: 2022-07-01 — End: 2022-07-01

## 2022-07-01 MED ORDER — ONDANSETRON HCL 4 MG/2ML IJ SOLN
4 MG/2ML | INTRAMUSCULAR | Status: DC | PRN
  Administered 2022-07-01: 20:00:00 4 via INTRAVENOUS

## 2022-07-01 MED ORDER — DOCUSATE SODIUM 100 MG PO CAPS
100 | Freq: Two times a day (BID) | ORAL | Status: DC
Start: 2022-07-01 — End: 2022-07-02
  Administered 2022-07-02: 100 mg via ORAL

## 2022-07-01 MED ORDER — NORMAL SALINE FLUSH 0.9 % IV SOLN
0.9 | Freq: Two times a day (BID) | INTRAVENOUS | Status: DC
Start: 2022-07-01 — End: 2022-07-01

## 2022-07-01 MED ORDER — OXYCODONE HCL 5 MG PO TABS
5 | ORAL | Status: DC | PRN
Start: 2022-07-01 — End: 2022-07-02

## 2022-07-01 MED ORDER — PROPOFOL 200 MG/20ML IV EMUL
200 | INTRAVENOUS | Status: AC
Start: 2022-07-01 — End: ?

## 2022-07-01 MED ORDER — PROPOFOL 200 MG/20ML IV EMUL
200 MG/20ML | INTRAVENOUS | Status: DC | PRN
  Administered 2022-07-01: 20:00:00 130 via INTRAVENOUS

## 2022-07-01 MED ORDER — LIDOCAINE HCL (PF) 1 % IJ SOLN
1 | Freq: Once | INTRAMUSCULAR | Status: AC | PRN
Start: 2022-07-01 — End: 2022-07-01
  Administered 2022-07-01: 17:00:00 0.1 mL via INTRADERMAL

## 2022-07-01 MED ORDER — DEXAMETHASONE SODIUM PHOSPHATE 4 MG/ML IJ SOLN
4 MG/ML | INTRAMUSCULAR | Status: DC | PRN
  Administered 2022-07-01: 20:00:00 4 via INTRAVENOUS

## 2022-07-01 MED ORDER — ONDANSETRON 4 MG PO TBDP
4 | Freq: Three times a day (TID) | ORAL | Status: DC | PRN
Start: 2022-07-01 — End: 2022-07-02

## 2022-07-01 MED ORDER — ACETAMINOPHEN 325 MG PO TABS
325 | Freq: Once | ORAL | Status: DC | PRN
Start: 2022-07-01 — End: 2022-07-01

## 2022-07-01 MED ORDER — HYDRALAZINE HCL 20 MG/ML IJ SOLN
20 | INTRAMUSCULAR | Status: DC | PRN
Start: 2022-07-01 — End: 2022-07-01

## 2022-07-01 MED ORDER — FENTANYL CITRATE (PF) 100 MCG/2ML IJ SOLN
100 | INTRAMUSCULAR | Status: AC
Start: 2022-07-01 — End: ?

## 2022-07-01 MED ORDER — EPHEDRINE SULFATE (PRESSORS) 50 MG/ML IV SOLN
50 | INTRAVENOUS | Status: AC
Start: 2022-07-01 — End: ?

## 2022-07-01 MED ORDER — SIMETHICONE 80 MG PO CHEW
80 | Freq: Four times a day (QID) | ORAL | Status: DC | PRN
Start: 2022-07-01 — End: 2022-07-02
  Administered 2022-07-02: 80 mg via ORAL

## 2022-07-01 MED ORDER — SODIUM CHLORIDE 0.9 % IV SOLN
0.9 | INTRAVENOUS | Status: DC | PRN
Start: 2022-07-01 — End: 2022-07-01

## 2022-07-01 MED ORDER — ACETAMINOPHEN 500 MG PO TABS
500 | Freq: Three times a day (TID) | ORAL | Status: DC
Start: 2022-07-01 — End: 2022-07-02
  Administered 2022-07-02: 09:00:00 1000 mg via ORAL

## 2022-07-01 MED ORDER — DROPERIDOL 2.5 MG/ML IJ SOLN
2.5 | Freq: Once | INTRAMUSCULAR | Status: DC | PRN
Start: 2022-07-01 — End: 2022-07-01

## 2022-07-01 MED ORDER — MEPERIDINE HCL 25 MG/ML IJ SOLN
25 | INTRAMUSCULAR | Status: DC | PRN
Start: 2022-07-01 — End: 2022-07-01

## 2022-07-01 MED ORDER — DEXAMETHASONE SODIUM PHOSPHATE 4 MG/ML IJ SOLN
4 | INTRAMUSCULAR | Status: AC
Start: 2022-07-01 — End: ?

## 2022-07-01 MED ORDER — LORAZEPAM 2 MG/ML IJ SOLN
2 | Freq: Once | INTRAMUSCULAR | Status: DC | PRN
Start: 2022-07-01 — End: 2022-07-01

## 2022-07-01 MED ORDER — FENTANYL CITRATE (PF) 100 MCG/2ML IJ SOLN
100 | INTRAMUSCULAR | Status: DC | PRN
Start: 2022-07-01 — End: 2022-07-01

## 2022-07-01 MED ORDER — LACTATED RINGERS IV SOLN
INTRAVENOUS | Status: DC
Start: 2022-07-01 — End: 2022-07-01
  Administered 2022-07-01: 17:00:00 via INTRAVENOUS

## 2022-07-01 MED ORDER — EPHEDRINE SULFATE (PRESSORS) 50 MG/ML IV SOLN
50 MG/ML | INTRAVENOUS | Status: DC | PRN
  Administered 2022-07-01: 20:00:00 10 via INTRAVENOUS
  Administered 2022-07-01: 20:00:00 5 via INTRAVENOUS
  Administered 2022-07-01 (×2): 10 via INTRAVENOUS
  Administered 2022-07-01 (×2): 5 via INTRAVENOUS

## 2022-07-01 MED ORDER — ACETAMINOPHEN 500 MG PO TABS
500 | Freq: Once | ORAL | Status: AC
Start: 2022-07-01 — End: 2022-07-01
  Administered 2022-07-01: 17:00:00 1000 mg via ORAL

## 2022-07-01 MED ORDER — LIDOCAINE HCL (PF) 2 % IJ SOLN
2 % | INTRAMUSCULAR | Status: DC | PRN
  Administered 2022-07-01: 20:00:00 50 via INTRAVENOUS

## 2022-07-01 MED ORDER — CEFAZOLIN SODIUM-DEXTROSE 2-3 GM-%(50ML) IV SOLR
2000 | INTRAVENOUS | Status: AC
Start: 2022-07-01 — End: 2022-07-01
  Administered 2022-07-01: 20:00:00 2000 mg via INTRAVENOUS

## 2022-07-01 MED ORDER — ONDANSETRON HCL 4 MG/2ML IJ SOLN
4 | Freq: Four times a day (QID) | INTRAMUSCULAR | Status: DC | PRN
Start: 2022-07-01 — End: 2022-07-02

## 2022-07-01 MED ORDER — NALOXONE HCL 0.4 MG/ML IJ SOLN
0.4 MG/ML | INTRAMUSCULAR | Status: DC | PRN
Start: 2022-07-01 — End: 2022-07-01

## 2022-07-01 MED ORDER — IPRATROPIUM-ALBUTEROL 0.5-2.5 (3) MG/3ML IN SOLN
Freq: Once | RESPIRATORY_TRACT | Status: DC | PRN
Start: 2022-07-01 — End: 2022-07-01

## 2022-07-01 MED ORDER — HYDROMORPHONE HCL 1 MG/ML IJ SOLN
1 | INTRAMUSCULAR | Status: DC | PRN
Start: 2022-07-01 — End: 2022-07-01

## 2022-07-01 MED ORDER — FENTANYL CITRATE (PF) 100 MCG/2ML IJ SOLN
100 MCG/2ML | INTRAMUSCULAR | Status: DC | PRN
  Administered 2022-07-01 (×2): 50 via INTRAVENOUS

## 2022-07-01 MED ORDER — SODIUM CHLORIDE (PF) 0.9 % IJ SOLN
0.9 % | Freq: Two times a day (BID) | INTRAMUSCULAR | Status: DC
Start: 2022-07-01 — End: 2022-07-02
  Administered 2022-07-02: 20 mg via INTRAVENOUS

## 2022-07-01 MED ORDER — NORMAL SALINE FLUSH 0.9 % IV SOLN
0.9 | INTRAVENOUS | Status: DC | PRN
Start: 2022-07-01 — End: 2022-07-01

## 2022-07-01 MED ORDER — ONDANSETRON HCL 4 MG/2ML IJ SOLN
4 | Freq: Once | INTRAMUSCULAR | Status: DC | PRN
Start: 2022-07-01 — End: 2022-07-01

## 2022-07-01 MED ORDER — BUPIVACAINE-EPINEPHRINE 0.5% -1:200000 IJ SOLN
INTRAMUSCULAR | Status: DC | PRN
Start: 2022-07-01 — End: 2022-07-01
  Administered 2022-07-01: 20:00:00 25 via INTRAPLEURAL

## 2022-07-01 MED ORDER — LACTATED RINGERS IV SOLN
INTRAVENOUS | Status: DC
Start: 2022-07-01 — End: 2022-07-02
  Administered 2022-07-02: 02:00:00 via INTRAVENOUS

## 2022-07-01 MED ORDER — HYDROMORPHONE HCL PF 2 MG/ML IJ SOLN
2 | INTRAMUSCULAR | Status: DC | PRN
Start: 2022-07-01 — End: 2022-07-02

## 2022-07-01 MED ORDER — LACTATED RINGERS IV SOLN
INTRAVENOUS | Status: DC | PRN
Start: 2022-07-01 — End: 2022-07-01
  Administered 2022-07-01: 18:00:00 via INTRAVENOUS

## 2022-07-01 MED ORDER — ONDANSETRON HCL 4 MG/2ML IJ SOLN
4 | INTRAMUSCULAR | Status: AC
Start: 2022-07-01 — End: ?

## 2022-07-01 MED ORDER — LIDOCAINE HCL (PF) 2 % IJ SOLN
2 | INTRAMUSCULAR | Status: AC
Start: 2022-07-01 — End: ?

## 2022-07-01 MED FILL — FENTANYL CITRATE (PF) 100 MCG/2ML IJ SOLN: 100 MCG/2ML | INTRAMUSCULAR | Qty: 2

## 2022-07-01 MED FILL — FAMOTIDINE (PF) 20 MG/2ML IV SOLN: 20 MG/2ML | INTRAVENOUS | Qty: 2

## 2022-07-01 MED FILL — XYLOCAINE-MPF 1 % IJ SOLN: 1 % | INTRAMUSCULAR | Qty: 2

## 2022-07-01 MED FILL — XYLOCAINE-MPF 2 % IJ SOLN: 2 % | INTRAMUSCULAR | Qty: 5

## 2022-07-01 MED FILL — ACETAMINOPHEN EXTRA STRENGTH 500 MG PO TABS: 500 MG | ORAL | Qty: 2

## 2022-07-01 MED FILL — CEFAZOLIN SODIUM-DEXTROSE 2-3 GM-%(50ML) IV SOLR: 2000 mg in Dextrose 3% | INTRAVENOUS | Qty: 50

## 2022-07-01 MED FILL — ONDANSETRON HCL 4 MG/2ML IJ SOLN: 4 MG/2ML | INTRAMUSCULAR | Qty: 2

## 2022-07-01 MED FILL — DEXAMETHASONE SODIUM PHOSPHATE 4 MG/ML IJ SOLN: 4 MG/ML | INTRAMUSCULAR | Qty: 1

## 2022-07-01 MED FILL — EPHEDRINE SULFATE (PRESSORS) 50 MG/ML IV SOLN: 50 MG/ML | INTRAVENOUS | Qty: 1

## 2022-07-01 MED FILL — DOCUSATE SODIUM 100 MG PO CAPS: 100 MG | ORAL | Qty: 1

## 2022-07-01 MED FILL — SIMETHICONE 80 MG PO CHEW: 80 MG | ORAL | Qty: 1

## 2022-07-01 MED FILL — DIPRIVAN 200 MG/20ML IV EMUL: 200 MG/20ML | INTRAVENOUS | Qty: 20

## 2022-07-01 NOTE — Plan of Care (Signed)
Problem: Pain  Goal: Verbalizes/displays adequate comfort level or baseline comfort level  Outcome: Progressing

## 2022-07-01 NOTE — Anesthesia Post-Procedure Evaluation (Signed)
Department of Anesthesiology  Postprocedure Note    Patient: Jodi Powell  MRN: [id number]  Birthdate: 10/09/1950  Date of evaluation: 07/01/2022    Procedure Summary       Date: 07/01/22 Room / Location: [facility] OR 02 / [facility] MAIN OR    Anesthesia Start: 1546 Anesthesia Stop: 1652    Procedures:       VAGINAL COLPOCLEISIS (Vagina )      VAGINAL REPAIR ANTERIOR AND POSTERIOR (Vagina )      VAGINAL POSTERIOR REPAIR (Vagina ) Diagnosis:       Complete prolapse of vaginal vault      Vaginal enterocele      (Complete prolapse of vaginal vault [N99.3])      (Vaginal enterocele [N81.5])    Surgeons: Shelda Altes, MD Responsible Provider: De Burrs, MD    Anesthesia Type: General ASA Status: 3            Anesthesia Type: General    Aldrete Phase I: Aldrete Score: 10    Aldrete Phase II:      Anesthesia Post Evaluation    Patient location during evaluation: PACU  Level of consciousness: awake and alert  Airway patency: patent  Nausea & Vomiting: no nausea and no vomiting  Cardiovascular status: blood pressure returned to baseline  Respiratory status: acceptable  Hydration status: stable  Comments: Vitals reviewed  Pain management: adequate    No notable events documented.

## 2022-07-01 NOTE — Op Note (Signed)
Operative Note      Patient: Jodi Powell  Date of Birth: 01/08/1951  MRN: [id number]    Date of Procedure: 2022-07-25    Pre-Op Diagnosis Codes:     * Complete prolapse of vaginal vault [N99.3]     * Vaginal enterocele [N81.5]    Post-Op Diagnosis: Same       Procedure(s):  VAGINAL COLPOCLEISIS  VAGINAL REPAIR ANTERIOR AND POSTERIOR  VAGINAL POSTERIOR REPAIR    Surgeon(s):  Shelda Altes, MD    Assistant:   * No surgical staff found *    Anesthesia: General    Estimated Blood Loss (mL): Minimal    Complications: None    Specimens:   * No specimens in log *    Implants:  * No implants in log *      Drains: * No LDAs found *    Findings:  Infection Present At Time Of Surgery (PATOS) (choose all levels that have infection present):  No infection present  Other Findings: 1.  Surgically absent uterus and cervix 2.  Postobstructive vaginal prolapse with predominant apical and posterior compartment procidentia and stage II 3.  Normal cystoscopy    Indications: Patient is a 72 year old who presents with recurrent postobstructive vaginal vault prolapse desiring definitive treatment and is elected to proceed with vaginal closure, colpocleisis, and anterior posterior repair.    Detailed Description of Procedure:   Intravenous fluids and SCD hose were placed in the preoperative area. IV antibiotics were administered per protocol. After obtaining informed consent the patient was transferred to the operating room. She was then transferred to the operating table. After successful anesthetic induction she was positioned in the lithotomy with her legs placed in the [place] soft stirrups. She then underwent sterile preparation  and draped for vaginal surgery. Surgical pause was then performed for patient identification and procedure confirmation. Supplies and instruments were also confirmed at this time. A 16 Fr. Foley catheter was placed into the bladder and the bladder was allowed to drain of clear urine.   COLPOCLEISIS: The  [place] vaginal retractor was placed for exposure.  Kelly clamps were placed on the cervix which was delivered through the introitus under traction.  Tumescence and analgesic injection of the anterior and posterior vaginal epithelium was performed with 0.25% Marcaine and 1:100K epinephrine. Rectangular area of the vaginal epithelium were marked to be removed anteriorly and posteriorly. The anterior posterior compartment vaginal epithelium was marked for excision radius was also marked for excision and closure.  Perineorrhaphy and posterior repair.  #15 scalpel blade was then used to excise the scored areas denuding the vaginal epithelium expose the underlying pubocervical fascia of the anterior apical vagina as well as posterior compartment extending the apex to the perineum.  The genital hiatus measured 5 cm in beginning of this procedure and was marked to closure the proximal 2 cm diameter.  The underlying pubocervical fascia of the anterior apical and posterior compartments was then closed fibromuscular pursestring reapproximation using 2 0 V-Loc suture.  The epithelium was closed with running 2-0 Monocryl in a running fashion with apical anterior and posterior vaginal thelium the bladder and the apex.  The posterior compartment of the vagina was then narrowed using 2-0 V-Lok PDS suture in a running fashion reapproximating the endopelvic fascia the posterior compartment and the bubble cavernosus.  2-0 Monocryl suture was then used to close the superficial fibromuscular layer in a running fashion.  Once the vaginal cavity was closed to depth of  4 cm the vaginal lesion was closed using running 2-0 Monocryl suture.  The perineal body was closed with deep 2-0 Monocryl suture in a running fashion reapproximating toenail body.  The skin was closed over the perineal body and distal posterior vagina with a running subcuticular 3-0 Monocryl suture.  All sponge and instrument counts were correct.  Interrupted 4-0 Vicryl  sutures were used to place tension sutures of the peritoneal skin.   CYSTOURETHROSCOPY: Examination of the bladder then showed no sutures in the bladder.  There were no injuries to the urethra.  We noted brisk efflux from bilateral ureters. The vaginal epithelium was then closed with 2-0 Vicryl suture in a running.     The Foley catheter was removed at patient request anticipating postop voiding trials.  All needle, sponge, instrument counts were correct x2.  Surgical debrief was performed with the operative room team. The patient tolerated the procedure well and was transferred to the recovery room in stable condition.      Electronically signed by Shelda Altes, MD on 07/01/2022 at 5:02 PM       Electronically signed by Shelda Altes, MD on 07/01/2022 at 5:00 PM

## 2022-07-01 NOTE — Anesthesia Pre-Procedure Evaluation (Signed)
Department of Anesthesiology  Preprocedure Note       Name:  Jodi Powell   Age:  72 y.o.  DOB:  [birth year]                                          MRN:  [id number]         Date:  07/01/2022      Surgeon: Moishe Spice):  Shelda Altes, MD    Procedure: Procedure(s):  VAGINAL COLPOCLEISIS  VAGINAL REPAIR ANTERIOR AND POSTERIOR  VAGINAL POSTERIOR REPAIR    Medications prior to admission:   Prior to Admission medications    Medication Sig Start Date End Date Taking? Authorizing Provider   niacinamide 500 MG tablet Take 1 tablet by mouth 2 times daily (with meals)   Yes [provider]   spironolactone (ALDACTONE) 25 MG tablet Take 0.5 tablets by mouth every other day  Patient taking differently: Take 0.5 tablets by mouth every other day IN THE MORNING 03/28/22   Megan Mans, MD   rosuvastatin (CRESTOR) 5 MG tablet 1 tablet Orally Once a day for 90 days  Patient taking differently: Take 1 tablet by mouth every morning 1 tablet Orally Once a day for 90 days 03/27/22   Sherlene Shams, NP-C   metoprolol succinate (TOPROL XL) 25 MG extended release tablet Take 1 tablet by mouth daily  Patient taking differently: Take 1 tablet by mouth every morning 03/19/22 06/17/22  Sherlene Shams, NP-C   pantoprazole (PROTONIX) 40 MG tablet Take 1 tablet by mouth daily  Patient taking differently: Take 1 tablet by mouth every morning 03/19/22   Sherlene Shams, NP-C   losartan-hydroCHLOROthiazide (HYZAAR) 100-25 MG per tablet Take 1 tablet by mouth daily  Patient taking differently: Take 1 tablet by mouth every morning 01/02/22 04/02/22  Sherlene Shams, NP-C   fluticasone (FLONASE) 50 MCG/ACT nasal spray 1 spray by Nasal route every morning  Patient taking differently: 1 spray by Nasal route daily as needed for Allergies 10/02/21   Malena Edman, MD   amLODIPine (NORVASC) 5 MG tablet 1 tablet Orally Once a day for 90 days  Patient taking differently: Take 1 tablet by mouth every morning 1 tablet Orally Once a day for 90 days 08/23/21    Sherlene Shams, NP-C   aspirin 81 MG EC tablet Take 1 tablet by mouth every morning 02/10/20   [provider]   EPINEPHrine (EPIPEN) 0.3 MG/0.3ML SOAJ injection Inject 0.3 mLs into the muscle as needed (ANAPHYLAXIS) 08/22/20   [provider]   Cholecalciferol (VITAMIN D) 50 MCG (2000 UT) CAPS capsule Take 1 capsule by mouth daily    [provider]       Current medications:    No current facility-administered medications for this encounter.     Current Outpatient Medications   Medication Sig Dispense Refill   . niacinamide 500 MG tablet Take 1 tablet by mouth 2 times daily (with meals)     . spironolactone (ALDACTONE) 25 MG tablet Take 0.5 tablets by mouth every other day (Patient taking differently: Take 0.5 tablets by mouth every other day IN THE MORNING) 30 tablet 2   . rosuvastatin (CRESTOR) 5 MG tablet 1 tablet Orally Once a day for 90 days (Patient taking differently: Take 1 tablet by mouth every morning 1 tablet Orally Once a day for 90 days) 90 tablet 2   .  metoprolol succinate (TOPROL XL) 25 MG extended release tablet Take 1 tablet by mouth daily (Patient taking differently: Take 1 tablet by mouth every morning) 90 tablet 2   . pantoprazole (PROTONIX) 40 MG tablet Take 1 tablet by mouth daily (Patient taking differently: Take 1 tablet by mouth every morning) 90 tablet 2   . losartan-hydroCHLOROthiazide (HYZAAR) 100-25 MG per tablet Take 1 tablet by mouth daily (Patient taking differently: Take 1 tablet by mouth every morning) 90 tablet 3   . fluticasone (FLONASE) 50 MCG/ACT nasal spray 1 spray by Nasal route every morning (Patient taking differently: 1 spray by Nasal route daily as needed for Allergies) 4 each 5   . amLODIPine (NORVASC) 5 MG tablet 1 tablet Orally Once a day for 90 days (Patient taking differently: Take 1 tablet by mouth every morning 1 tablet Orally Once a day for 90 days) 90 tablet 3   . aspirin 81 MG EC tablet Take 1 tablet by mouth every morning     .  EPINEPHrine (EPIPEN) 0.3 MG/0.3ML SOAJ injection Inject 0.3 mLs into the muscle as needed (ANAPHYLAXIS)     . Cholecalciferol (VITAMIN D) 50 MCG (2000 UT) CAPS capsule Take 1 capsule by mouth daily         Allergies:    Allergies   Allergen Reactions   . Latex Rash   . Sulfa Antibiotics Rash and Headaches   . Seasonal    . Adhesive Tape Rash       Problem List:    Patient Active Problem List   Diagnosis Code   . Abnormal findings on diagnostic imaging of other specified body structures R93.89   . Chronic GERD K21.9   . Dyslipidemia E78.5   . Essential hypertension I10   . Nasal congestion R09.81   . Nonrheumatic aortic (valve) stenosis I35.0   . Other specified symptoms and signs involving the circulatory and respiratory systems R09.89   . 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 Seasonal allergies J30.2   . Thyroid nodule E04.1   . 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 Aortic valve disorder I35.9   . Hypercholesterolemia E78.00   . Malignant melanoma (HCC) C43.9   . Spinal stenosis of lumbar region M48.061   . Vascular disorder I99.9   . Bladder problem N32.9   . Rectocele N81.6   . Cystocele with rectocele N81.10, N81.6   . Pelvic floor weakness in female N81.89   . 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 Vaginal enterocele N81.5   . 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 Acid reflux    . Allergies     cats, mold, mildew, dust   . Cystocele with rectocele    . History of rosacea    . Hx of melanoma of skin     RIGHT ANKLE, LEFT FOOT, LEFT KNEE   . Hyperlipidemia    . Hypertension    . Recurrent gastrointestinal hemorrhage     AV malformations   . Thyroid nodule 12/20/2019    Right       Past Surgical History:        Procedure Laterality Date   . AORTIC VALVE REPLACEMENT  06/2009   . AORTIC VALVE REPLACEMENT  02/09/2020    TAVR - DR. ROSS   . APPENDECTOMY  1958   . BILATERAL OOPHORECTOMY  2010   . BREAST BIOPSY  Right 2001    Benign   . CARDIAC CATHETERIZATION  01/2020    Dr. Aggie Cosier   . COLONOSCOPY  2021   . HYSTERECTOMY, VAGINAL  1988   . PERINEAL SURGERY N/A 09/03/2021    PERINEAL REPAIR performed by Shelda Altes, MD at [facility] MAIN OR   . TONSILLECTOMY  1956   . UPPER GASTROINTESTINAL ENDOSCOPY  12/2019    Dr. Claris Gladden   . VAGINA SURGERY N/A 09/03/2021    VAGINAL REPAIR ANTERIOR AND POSTERIOR VAGINAL SUSPENSION performed by Shelda Altes, MD at [facility] MAIN OR       Social History:    Social History     Tobacco Use   . Smoking status: Former     Current packs/day: 0.00     Average packs/day: 0.5 packs/day for 22.0 years (11.0 ttl pk-yrs)     Types: Cigarettes     Start date: 54     Quit date: 1991     Years since quitting: 33.4   . Smokeless tobacco: Never   . Tobacco comments:     Patient counselled on the dangers of tobacco 12/06/2019, Cessation: Not applicable    Substance Use Topics   . Alcohol use: Yes     Alcohol/week: 5.0 standard drinks of alcohol     Types: 5 Glasses of wine per week     Comment: 5 glasses of wine weekly                                Counseling given: Not Answered  Tobacco comments: Patient counselled on the dangers of tobacco 12/06/2019, Cessation: Not applicable       Vital Signs (Current):   Vitals:    06/20/22 1600   Weight: 73.5 kg (162 lb)   Height: 1.6 m (5\' 3" )                                              BP Readings from Last 3 Encounters:   05/27/22 130/70   02/28/22 116/76   01/03/22 116/76       NPO Status:                                                                                 BMI:   Wt Readings from Last 3 Encounters:   05/27/22 75.8 kg (167 lb)   04/30/22 75.4 kg (166 lb 3.2 oz)   02/28/22 72.6 kg (160 lb)     Body mass index is 28.7 kg/m.    CBC:   Lab Results   Component Value Date/Time    WBC 6.3 06/25/2022 01:17 PM    RBC 4.51 06/25/2022 01:17 PM    HGB 14.7 06/25/2022 01:17 PM    HCT 43.3 06/25/2022 01:17 PM    MCV 96.0 06/25/2022 01:17 PM    RDW 12.6 06/25/2022 01:17  PM    PLT 217 06/25/2022 01:17 PM       CMP:  Lab Results   Component Value Date/Time    NA 137 09/25/2021 09:36 AM    K 3.9 06/25/2022 01:19 PM    CL 98 09/25/2021 09:36 AM    CO2 24 09/25/2021 09:36 AM    BUN 21 09/25/2021 09:36 AM    CREATININE 0.9 09/25/2021 09:36 AM    GFRAA 102 02/23/2020 10:37 AM    LABGLOM 68 09/25/2021 09:36 AM    GLUCOSE 112 09/25/2021 09:36 AM    CALCIUM 9.7 09/25/2021 09:36 AM    BILITOT 0.98 09/25/2021 09:36 AM    ALKPHOS 91 09/25/2021 09:36 AM    AST 21 09/25/2021 09:36 AM    ALT 19 09/25/2021 09:36 AM       POC Tests: No results for input(s): "POCGLU", "POCNA", "POCK", "POCCL", "POCBUN", "POCHEMO", "POCHCT" in the last 72 hours.    Coags:   Lab Results   Component Value Date/Time    PROTIME 26.1 04/27/2020 10:28 AM    INR 2.4 04/27/2020 10:28 AM    APTT 32.6 01/31/2020 11:07 AM       HCG (If Applicable): No results found for: "PREGTESTUR", "PREGSERUM", "HCG", "HCGQUANT"     ABGs: No results found for: "PHART", "PO2ART", "PCO2ART", "HCO3ART", "BEART", "O2SATART"     Type & Screen (If Applicable):  No results found for: "LABABO"    Drug/Infectious Status (If Applicable):  No results found for: "HIV", "HEPCAB"    COVID-19 Screening (If Applicable):   Lab Results   Component Value Date/Time    COVID19 Not Detected 03/09/2020 02:27 PM    COVID19 Negative 02/06/2020 12:21 PM           Anesthesia Evaluation  Patient summary reviewed and Nursing notes reviewed  Airway: Mallampati: II  TM distance: >3 FB        Dental: normal exam         Pulmonary:normal exam                               Cardiovascular:    (+) hypertension:, valvular problems/murmurs (s/p TAVR): AS, hyperlipidemia        Rhythm: regular  Rate: normal                    Neuro/Psych:               GI/Hepatic/Renal:   (+) GERD: no interval change          Endo/Other:                      ROS comment: Bladder disorder Abdominal:             Vascular:          Other Findings:       Anesthesia Plan      general     ASA 3     (GA   ETT or LMA)  Induction: intravenous.      Anesthetic plan and risks discussed with patient.      Plan discussed with CRNA.    Attending anesthesiologist reviewed and agrees with Preprocedure content            De Burrs, MD   07/01/2022

## 2022-07-02 MED FILL — ACETAMINOPHEN EXTRA STRENGTH 500 MG PO TABS: 500 MG | ORAL | Qty: 2

## 2022-07-02 MED FILL — SIMETHICONE 80 MG PO CHEW: 80 MG | ORAL | Qty: 1

## 2022-07-02 MED FILL — DOCUSATE SODIUM 100 MG PO CAPS: 100 MG | ORAL | Qty: 1

## 2022-07-02 NOTE — Discharge Instructions (Signed)
Discharge instructions:  No driving for one week. May resume driving once no longer taking narcotic pain medications.  No strenuous activity for two weeks.    For pain you may take Extra Strength Tylenol (2 tablets every four hours), or ibuprofen (600  800 mg every six hours), or a prescription pain pill may also be prescribed.  You may shower or bathe.  There are no diet restrictions.  Avoid straining when having a bowel movement or urinating. If you experience constipation, you may take Milk of Magnesia or Metamucil (one teaspoon in a glass of juice), or you may use an over-the-counter laxative.  Call your doctor if you have excessive vaginal bleeding or a fever greater than 101 degrees. Expect some spotting or light bleeding for up to six weeks.  Schedule a post-operative appointment for one week and 3-4 weeks after your surgery.

## 2022-07-02 NOTE — Discharge Summary (Signed)
Discharge Summary    Date: 07/02/2022  Patient Name: Jodi Powell    Date of Birth: [birth year]     Age: 72 y.o.    Admit Date: 07/01/2022  Discharge Date: 07/02/2022  Discharge Condition: Good    Admission Diagnosis  Complete prolapse of vaginal vault [N99.3];Vaginal enterocele [N81.5];Vaginal vault prolapse after hysterectomy [N99.3];Vaginal wall prolapse [N81.10]      Discharge Diagnosis  Principal Problem:    Vaginal vault prolapse after hysterectomy  Active Problems:    Vaginal enterocele    Vaginal wall prolapse  Resolved Problems:    Complete prolapse of vaginal vault      Hospital Stay  Narrative of Hospital Course:  Ms. Ogg presented is a 72 year old with a recurrent vaginal vault prolapse.  She underwent colpocleisis and anterior posterior pair.  Postoperative course was uncomplicated.  She was able to void postoperatively and diet advanced.  She was discharged home on day #1 following review of postop instructions and restrictions.    Consultants:  None    Surgeries/procedures Performed:  07/01/2022 date of surgery:  1.  Colpocleisis 2.  Anterior pair 3.  Posterior pair         Treatments:    Surgery        Discharge Plan/Disposition:  Home    Hospital/Incidental Findings Requiring Follow Up:    Patient Instructions:    Diet:    Activity:  For number of days (if applicable):      Other Instructions: Discharge instructions:  No driving for one  week. May resume driving once no longer taking narcotic pain medications.  No strenuous activity for two weeks.  No  intercourse   For pain you may take Extra Strength Tylenol (2 tablets every four hours), or ibuprofen (600  800 mg every six hours), or a prescription pain pill may also be prescribed.  You may shower or bathe.  There are no diet restrictions.  Avoid straining when having a bowel movement or urinating. If you experience constipation, you may take Milk of Magnesia or Metamucil (one teaspoon in a glass of juice), or you may use an over-the-counter  laxative.  Call your doctor if you have excessive vaginal bleeding or a fever greater than 101 degrees. Expect some spotting or light bleeding for up to six weeks.  Schedule a post-operative appointment for one week and 3-4 weeks after your surgery.     Provider Follow-Up:   No follow-ups on file.     Significant Diagnostic Studies:    Recent Labs:  No visits with results within 1 Day(s) from this visit.  Latest known visit with results is:  Orders Only on 06/25/2022  Potassium                                     Date: 06/25/2022  Value: 3.9         Ref range: 3.5 - 5.3 mmol/L   Status: Final  ------------    Radiology last 7 days:  No results found.     @DCPENDLAB @    Discharge Medications    Current Discharge Medication List        Current Discharge Medication List        Current Discharge Medication List    CONTINUE these medications which have NOT CHANGED    niacinamide 500 MG tablet  Take 1 tablet by mouth 2 times daily (with meals)  spironolactone (ALDACTONE) 25 MG tablet  Take 0.5 tablets by mouth every other day  Qty: 30 tablet Refills: 2    rosuvastatin (CRESTOR) 5 MG tablet  1 tablet Orally Once a day for 90 days  Qty: 90 tablet Refills: 2  Associated Diagnoses:Dyslipidemia    metoprolol succinate (TOPROL XL) 25 MG extended release tablet  Take 1 tablet by mouth daily  Qty: 90 tablet Refills: 2  Associated Diagnoses:Essential (primary) hypertension    pantoprazole (PROTONIX) 40 MG tablet  Take 1 tablet by mouth daily  Qty: 90 tablet Refills: 2  Associated Diagnoses:Chronic GERD    losartan-hydroCHLOROthiazide (HYZAAR) 100-25 MG per tablet  Take 1 tablet by mouth daily  Qty: 90 tablet Refills: 3  Associated Diagnoses:Essential (primary) hypertension    fluticasone (FLONASE) 50 MCG/ACT nasal spray  1 spray by Nasal route every morning  Qty: 4 each Refills: 5  Associated Diagnoses:Seasonal allergies    amLODIPine (NORVASC) 5 MG tablet  1 tablet Orally Once a day for 90 days  Qty: 90 tablet Refills:  3  Associated Diagnoses:Essential (primary) hypertension    aspirin 81 MG EC tablet  Take 1 tablet by mouth every morning    EPINEPHrine (EPIPEN) 0.3 MG/0.3ML SOAJ injection  Inject 0.3 mLs into the muscle as needed (ANAPHYLAXIS)    Cholecalciferol (VITAMIN D) 50 MCG (2000 UT) CAPS capsule  Take 1 capsule by mouth daily          Current Discharge Medication List        Time Spent on Discharge:  15 minutes were spent in patient examination, evaluation, counseling as well as medication reconciliation, prescriptions for required medications, discharge plan, and follow up.    Electronically signed by Shelda Altes, MD on 07/02/22 at 9:12 AM EDT

## 2022-07-05 NOTE — Telephone Encounter (Signed)
Left message to call office to reschedule appointment on 07/16/22.

## 2022-07-16 ENCOUNTER — Ambulatory Visit
Admit: 2022-07-16 | Discharge: 2022-07-16 | Payer: MEDICARE | Attending: Obstetrics & Gynecology | Primary: Family Medicine

## 2022-07-16 DIAGNOSIS — Z09 Encounter for follow-up examination after completed treatment for conditions other than malignant neoplasm: Secondary | ICD-10-CM

## 2022-07-16 NOTE — Progress Notes (Signed)
07/16/2022       HPI:     Name: Jodi Powell  Date of Birth: 06/11/1950  Date of surgery: 07/01/2022  Procedure: Vaginal Colpocleisis, Vaginal Anterior/Posterior Repair  CC:  post op evaluation.  HPI: Jodi Powell returns for a 2 week postop visit.     Reviewed history and postop course. In the interim she reports:  Voiding difficulty: No  Initiating stream normal: No  Force stream normal: No  Incontinence: no   Urgency without incontinence: No   Frequency without incontinence: No   Bowel functions: constipation - patient is avoiding straining. Patient states that she takes a senokot daily to avoid hard stools.   Pain Controlled: Yes  Abnormal vaginal discharge: No  Vaginal bleeding: Yes- only slight bleeding/discharge since the procedure.  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     Past Medical History:   Past Medical History:   Diagnosis Date    Acid reflux     Allergies     cats, mold, mildew, dust    Cystocele with rectocele     History of rosacea     Hx of melanoma of skin     RIGHT ANKLE, LEFT FOOT, LEFT KNEE    Hyperlipidemia     Hypertension     Recurrent gastrointestinal hemorrhage     AV malformations    Thyroid nodule 12/20/2019    Right     Past Surgical History:   Past Surgical History:   Procedure Laterality Date    AORTIC VALVE REPLACEMENT  06/2009    AORTIC VALVE REPLACEMENT  02/09/2020    TAVR - DR. ROSS    APPENDECTOMY  1958    BILATERAL OOPHORECTOMY  2010    BREAST BIOPSY Right 2001    Benign    CARDIAC CATHETERIZATION  01/2020    Dr. Aggie Cosier    COLONOSCOPY  2021    HYSTERECTOMY, VAGINAL  1988    PERINEAL SURGERY N/A 09/03/2021    PERINEAL REPAIR performed by Shelda Altes, MD at [facility] MAIN OR    TONSILLECTOMY  1956    UPPER GASTROINTESTINAL ENDOSCOPY  12/2019    Dr. Claris Gladden    VAGINA SURGERY N/A 09/03/2021    VAGINAL REPAIR ANTERIOR AND POSTERIOR  VAGINAL SUSPENSION performed by Shelda Altes, MD at [facility] MAIN OR    VAGINA SURGERY N/A 07/01/2022    VAGINAL COLPOCLEISIS performed by Shelda Altes, MD at [facility] MAIN OR    VAGINA SURGERY N/A 07/01/2022    VAGINAL REPAIR ANTERIOR AND POSTERIOR performed by Shelda Altes, MD at [facility] MAIN OR    VAGINA SURGERY N/A 07/01/2022    VAGINAL POSTERIOR REPAIR performed by Shelda Altes, MD at [facility] MAIN OR     Current Medications:  Current Outpatient Medications   Medication Sig Dispense Refill    niacinamide 500 MG tablet Take 1 tablet by mouth 2 times daily (with meals)      spironolactone (ALDACTONE) 25 MG tablet Take 0.5 tablets by mouth every other day (Patient taking differently: Take 0.5 tablets by mouth every other day IN THE MORNING) 30 tablet 2    rosuvastatin (CRESTOR) 5 MG tablet 1 tablet Orally Once a day for 90 days (Patient taking differently: Take 1 tablet by mouth every morning 1 tablet Orally Once a day for 90 days)  90 tablet 2    metoprolol succinate (TOPROL XL) 25 MG extended release tablet Take 1 tablet by mouth daily (Patient taking differently: Take 1 tablet by mouth every morning) 90 tablet 2    pantoprazole (PROTONIX) 40 MG tablet Take 1 tablet by mouth daily (Patient taking differently: Take 1 tablet by mouth every morning) 90 tablet 2    losartan-hydroCHLOROthiazide (HYZAAR) 100-25 MG per tablet Take 1 tablet by mouth daily (Patient taking differently: Take 1 tablet by mouth every morning) 90 tablet 3    fluticasone (FLONASE) 50 MCG/ACT nasal spray 1 spray by Nasal route every morning (Patient taking differently: 1 spray by Nasal route daily as needed for Allergies) 4 each 5    amLODIPine (NORVASC) 5 MG tablet 1 tablet Orally Once a day for 90 days (Patient taking differently: Take 1 tablet by mouth every morning 1 tablet Orally Once a day for 90 days) 90 tablet 3    aspirin 81 MG EC tablet Take 1 tablet by mouth every morning      EPINEPHrine (EPIPEN) 0.3 MG/0.3ML SOAJ injection Inject 0.3 mLs into  the muscle as needed (ANAPHYLAXIS)      Cholecalciferol (VITAMIN D) 50 MCG (2000 UT) CAPS capsule Take 1 capsule by mouth daily       No current facility-administered medications for this visit.     Allergies:   Allergies   Allergen Reactions    Latex Rash    Sulfa Antibiotics Rash and Headaches    Seasonal     Adhesive Tape Rash     Social History:   Social History     Socioeconomic History    Marital status: Widowed     Spouse name: Not on file    Number of children: Not on file    Years of education: Not on file    Highest education level: Not on file   Occupational History    Not on file   Tobacco Use    Smoking status: Former     Current packs/day: 0.00     Average packs/day: 0.5 packs/day for 22.0 years (11.0 ttl pk-yrs)     Types: Cigarettes     Start date: 28     Quit date: 65     Years since quitting: 33.4    Smokeless tobacco: Never    Tobacco comments:     Patient counselled on the dangers of tobacco 12/06/2019, Cessation: Not applicable    Vaping Use    Vaping Use: Never used   Substance and Sexual Activity    Alcohol use: Yes     Alcohol/week: 5.0 standard drinks of alcohol     Types: 5 Glasses of wine per week     Comment: 5 glasses of wine weekly    Drug use: Never    Sexual activity: Not Currently   Other Topics Concern    Not on file   Social History Narrative    Not on file     Social Determinants of Health     Financial Resource Strain: Not on file   Food Insecurity: Not on file   Transportation Needs: Not on file   Physical Activity: Insufficiently Active (09/24/2021)    Exercise Vital Sign     Days of Exercise per Week: 4 days     Minutes of Exercise per Session: 30 min   Stress: Not on file   Social Connections: Not on file   Intimate Partner Violence: Not on file   Housing Stability:  Not on file     Family History:   Family History   Problem Relation Age of Onset    Elevated Lipids Mother     Coronary Art Dis Mother     Hypertension Mother     Breast Cancer Mother     Hypertension Father      Esophageal Cancer Father     Coronary Art Dis Father     Elevated Lipids Father     Hypertension Sister     Elevated Lipids Sister     Elevated Lipids Brother     Hypertension Brother          Objective:      Vitals:    07/16/22 1429   Weight: 75.4 kg (166 lb 3.2 oz)   Height: 1.613 m (5' 3.5")      Body mass index is 28.98 kg/m.   Physical Exam  Physical Exam  HENT:      Head: Normocephalic and atraumatic.   Cardiovascular:      Rate and Rhythm: Normal rate.      Pulses: Normal pulses.   Pulmonary:      Effort: Pulmonary effort is normal.   Abdominal:      General: Abdomen is flat.   Genitourinary:     General: Normal vulva.      Comments: Incisions intact without granulation tissue.  Sutures still present.  Digital examination with total vaginal length of 4 cm.    Skin:     General: Skin is dry.   Neurological:      General: No focal deficit present.      Mental Status: She is alert and oriented to person, place, and time.   Psychiatric:         Mood and Affect: Mood normal.         Behavior: Behavior normal.       Procedures/Test:  No results found for this visit on 07/16/22.         Assessnent/Plan:     Jodi Powell is postop 2 week Vaginal Colpocleisis, Vaginal Anterior/Posterior Repair    Doing well.   Activities and restrictions reviewed  May progress or return to don impact activities  Follow up in 4 weeks .     Yetta Barre) Ladona Ridgel, MD, [facility]  [facility]  [address], [address]  [place], [place] [address]  Office: [phone]

## 2022-07-22 MED ORDER — SPIRONOLACTONE 25 MG PO TABS
25 | ORAL_TABLET | ORAL | 2 refills | 30.00000 days | Status: DC
Start: 2022-07-22 — End: 2023-11-12

## 2022-08-03 ENCOUNTER — Encounter

## 2022-08-05 MED ORDER — AMLODIPINE BESYLATE 5 MG PO TABS
5 | ORAL_TABLET | ORAL | 3 refills | Status: DC
Start: 2022-08-05 — End: 2023-08-06

## 2022-08-05 NOTE — Telephone Encounter (Signed)
Per protocol criteria not met. OV> 6 months. Sending to practice for provider discretion.

## 2022-08-05 NOTE — Telephone Encounter (Signed)
Amlodipine last prescribed on 08/23/21, by Delice Bison.    RX ready.    LOV: 10/02/21  NOV: 10/07/22

## 2022-08-20 ENCOUNTER — Ambulatory Visit
Admit: 2022-08-20 | Discharge: 2022-08-20 | Payer: MEDICARE | Attending: Obstetrics & Gynecology | Primary: Family Medicine

## 2022-08-20 DIAGNOSIS — Z09 Encounter for follow-up examination after completed treatment for conditions other than malignant neoplasm: Secondary | ICD-10-CM

## 2022-08-20 NOTE — Assessment & Plan Note (Addendum)
S/P colpocleisis doing well  Release to return to normal activities

## 2022-08-20 NOTE — Progress Notes (Signed)
Jodi Powell (DOB:  1950/09/12) is a 72 y.o. female. Established patient here for evaluation of the following chief complaint(s):  Follow up 2 mo. Post op     PCP: Malena Edman, MD     ASSESSMENT/PLAN:  1. Postop check  Assessment & Plan:  S/P colpocleisis doing well  Release to return to normal activities       Return if symptoms worsen or fail to improve.    She was counseled on symptoms. She was asked to call or return sooner for pain, abnormal discharge, bleeding, spotting, or other concerns.         SUBJECTIVE/OBJECTIVE:  HPI  Jodi Powell returns for 2 mo. Post op check post Vaginal Colpocleisis, Vaginal Anterior/Posterior Repair on 07/01/2022. Last appoinment date was 07/16/2022.    In the interim she reports:  She reports:   vaginal discharge no   vaginal bleeding no  urinary symptoms no  defecatory symptoms no   pelvic pain no  prolapse no    Additional comments: Patient overall doing very well postsurgically.  Denies voiding or bowel problems    Past Medical History:  Past Medical History:   Diagnosis Date    Acid reflux     Allergies     cats, mold, mildew, dust    Cystocele with rectocele     History of rosacea     Hx of melanoma of skin     RIGHT ANKLE, LEFT FOOT, LEFT KNEE    Hyperlipidemia     Hypertension     Recurrent gastrointestinal hemorrhage     AV malformations    Thyroid nodule 12/20/2019    Right        Surgical History:  Past Surgical History:   Procedure Laterality Date    AORTIC VALVE REPLACEMENT  06/2009    AORTIC VALVE REPLACEMENT  02/09/2020    TAVR - DR. ROSS    APPENDECTOMY  1958    BILATERAL OOPHORECTOMY  2010    BREAST BIOPSY Right 2001    Benign    CARDIAC CATHETERIZATION  01/2020    Dr. Aggie Cosier    COLONOSCOPY  2021    HYSTERECTOMY, VAGINAL  1988    PERINEAL SURGERY N/A 09/03/2021    PERINEAL REPAIR performed by Shelda Altes, MD at [facility] MAIN OR    TONSILLECTOMY  1956    UPPER GASTROINTESTINAL ENDOSCOPY  12/2019    Dr. Claris Gladden    VAGINA SURGERY N/A 09/03/2021    VAGINAL REPAIR ANTERIOR  AND POSTERIOR VAGINAL SUSPENSION performed by Shelda Altes, MD at [facility] MAIN OR    VAGINA SURGERY N/A 07/01/2022    VAGINAL COLPOCLEISIS performed by Shelda Altes, MD at [facility] MAIN OR    VAGINA SURGERY N/A 07/01/2022    VAGINAL REPAIR ANTERIOR AND POSTERIOR performed by Shelda Altes, MD at [facility] MAIN OR    VAGINA SURGERY N/A 07/01/2022    VAGINAL POSTERIOR REPAIR performed by Shelda Altes, MD at [facility] MAIN OR        Family History of:  Family History   Problem Relation Age of Onset    Elevated Lipids Mother     Coronary Art Dis Mother     Hypertension Mother     Breast Cancer Mother     Hypertension Father     Esophageal Cancer Father     Coronary Art Dis Father     Elevated Lipids Father     Hypertension Sister     Elevated Lipids  Sister     Elevated Lipids Brother     Hypertension Brother         Current Outpatient Medications on file  Current Outpatient Medications   Medication Sig Dispense Refill    amLODIPine (NORVASC) 5 MG tablet 1 tablet Orally Once a day for 90 days 90 tablet 3    spironolactone (ALDACTONE) 25 MG tablet TAKE 1/2 TABLET BY MOUTH EVERY OTHER DAY 45 tablet 2    niacinamide 500 MG tablet Take 1 tablet by mouth 2 times daily (with meals)      rosuvastatin (CRESTOR) 5 MG tablet 1 tablet Orally Once a day for 90 days (Patient taking differently: Take 1 tablet by mouth every morning 1 tablet Orally Once a day for 90 days) 90 tablet 2    metoprolol succinate (TOPROL XL) 25 MG extended release tablet Take 1 tablet by mouth daily (Patient taking differently: Take 1 tablet by mouth every morning) 90 tablet 2    pantoprazole (PROTONIX) 40 MG tablet Take 1 tablet by mouth daily (Patient taking differently: Take 1 tablet by mouth every morning) 90 tablet 2    losartan-hydroCHLOROthiazide (HYZAAR) 100-25 MG per tablet Take 1 tablet by mouth daily (Patient taking differently: Take 1 tablet by mouth every morning) 90 tablet 3    fluticasone (FLONASE) 50 MCG/ACT nasal spray 1 spray by Nasal route every  morning (Patient taking differently: 1 spray by Nasal route daily as needed for Allergies) 4 each 5    aspirin 81 MG EC tablet Take 1 tablet by mouth every morning      EPINEPHrine (EPIPEN) 0.3 MG/0.3ML SOAJ injection Inject 0.3 mLs into the muscle as needed (ANAPHYLAXIS)      Cholecalciferol (VITAMIN D) 50 MCG (2000 UT) CAPS capsule Take 1 capsule by mouth daily       No current facility-administered medications for this visit.        Allergies:  Allergies   Allergen Reactions    Latex Rash    Sulfa Antibiotics Rash and Headaches    Seasonal     Adhesive Tape Rash        Social History:  Social History     Socioeconomic History    Marital status: Widowed     Spouse name: Not on file    Number of children: Not on file    Years of education: Not on file    Highest education level: Not on file   Occupational History    Not on file   Tobacco Use    Smoking status: Former     Current packs/day: 0.00     Average packs/day: 0.5 packs/day for 22.0 years (11.0 ttl pk-yrs)     Types: Cigarettes     Start date: 13     Quit date: 73     Years since quitting: 33.5    Smokeless tobacco: Never    Tobacco comments:     Patient counselled on the dangers of tobacco 12/06/2019, Cessation: Not applicable    Vaping Use    Vaping Use: Never used   Substance and Sexual Activity    Alcohol use: Yes     Alcohol/week: 5.0 standard drinks of alcohol     Types: 5 Glasses of wine per week     Comment: 5 glasses of wine weekly    Drug use: Never    Sexual activity: Not Currently   Other Topics Concern    Not on file   Social History Narrative  Not on file     Social Determinants of Health     Financial Resource Strain: Not on file   Food Insecurity: Not on file   Transportation Needs: Not on file   Physical Activity: Insufficiently Active (09/24/2021)    Exercise Vital Sign     Days of Exercise per Week: 4 days     Minutes of Exercise per Session: 30 min   Stress: Not on file   Social Connections: Not on file   Intimate Partner Violence:  Not on file   Housing Stability: Not on file           There were no vitals filed for this visit.   There is no height or weight on file to calculate BMI.     Physical Exam  HENT:      Head: Normocephalic and atraumatic.   Cardiovascular:      Rate and Rhythm: Normal rate.      Pulses: Normal pulses.   Pulmonary:      Effort: Pulmonary effort is normal.   Abdominal:      General: Abdomen is flat.   Genitourinary:     General: Normal vulva.      Comments: Vaginal and perineum incisions healed well without granulation tissue  Vaginal depth of 4 cm GH 3 cm  Skin:     General: Skin is dry.   Neurological:      General: No focal deficit present.      Mental Status: She is alert and oriented to person, place, and time.   Psychiatric:         Mood and Affect: Mood normal.         Behavior: Behavior normal.         On this date 08/20/2022 I have spent 20 minutes reviewing previous notes, test results and face to face with the patient discussing the diagnosis and importance of compliance with the treatment plan as well as documenting on the day of the visit.      An electronic signature was used to authenticate this note.    --Shelda Altes, MD

## 2022-09-27 ENCOUNTER — Encounter

## 2022-09-27 LAB — VITAMIN D 25 HYDROXY: Vit D, 25-Hydroxy: 62.1 ng/mL (ref 30.0–90.0)

## 2022-09-27 LAB — CBC
Hematocrit: 43.4 % (ref 34.0–47.0)
Hemoglobin: 14.9 g/dL (ref 11.5–15.7)
MCH: 32.7 pg (ref 27.0–34.5)
MCHC: 34.3 g/dL (ref 30.0–36.0)
MCV: 95.4 fL (ref 81.0–99.0)
MPV: 11.3 fL (ref 7.0–12.2)
NRBC Absolute: 0 10*3/uL (ref 0.000–0.012)
NRBC Automated: 0 % (ref 0.0–0.2)
Platelets: 223 10*3/uL (ref 140–440)
RBC: 4.55 x10e6/mcL (ref 3.60–5.20)
RDW: 12.6 % (ref 10.0–17.0)
WBC: 9.5 10*3/uL (ref 3.8–10.6)

## 2022-09-27 LAB — HEMOGLOBIN A1C
Estimated Avg Glucose: 123
Estimated Avg Glucose: 133
Hemoglobin A1C: 5.9 % (ref 4.0–6.0)

## 2022-09-27 LAB — COMPREHENSIVE METABOLIC PANEL
ALT: 20 U/L (ref 0–35)
AST: 27 U/L (ref 0–35)
Albumin/Globulin Ratio: 1.6 (ref 1.00–2.70)
Albumin: 4.6 g/dL (ref 3.5–5.2)
Alk Phosphatase: 92 U/L (ref 35–117)
Anion Gap: 12 mmol/L (ref 2–17)
BUN: 22 mg/dL (ref 8–23)
CO2: 28 mmol/L (ref 22–29)
Calcium: 10.1 mg/dL (ref 8.5–10.7)
Chloride: 98 mmol/L (ref 98–107)
Creatinine: 0.8 mg/dL (ref 0.5–1.0)
Est, Glom Filt Rate: 78 mL/min/1.73mÂ² (ref >=60)
Globulin: 2.8 g/dL (ref 1.9–4.4)
Glucose: 103 mg/dL — ABNORMAL HIGH (ref 70–99)
Osmolaliy Calculated: 279 mosm/kg (ref 270–287)
Potassium: 3.7 mmol/L (ref 3.5–5.3)
Sodium: 138 mmol/L (ref 135–145)
Total Bilirubin: 1.3 mg/dL — ABNORMAL HIGH (ref 0.00–1.20)
Total Protein: 7.4 g/dL (ref 5.7–8.3)

## 2022-09-27 LAB — LIPID PANEL
Chol/HDL Ratio: 2.3 (ref 0.0–4.4)
Cholesterol, Total: 191 mg/dL (ref 100–200)
HDL: 82 mg/dL (ref >=50)
LDL Cholesterol: 82.4 mg/dL (ref 0.0–100.0)
LDL/HDL Ratio: 1
Triglycerides: 133 mg/dL (ref 0–149)
VLDL: 26.6 mg/dL (ref 5.0–40.0)

## 2022-09-27 LAB — TSH REFLEX TO FT4: TSH: 1.4 u[IU]/mL (ref 0.358–3.740)

## 2022-10-01 NOTE — Other (Signed)
 Results were reviewed and will be discussed at next appointment.

## 2022-10-07 ENCOUNTER — Ambulatory Visit: Admit: 2022-10-07 | Discharge: 2022-10-07 | Attending: Family Medicine | Primary: Family Medicine

## 2022-10-07 ENCOUNTER — Encounter

## 2022-10-07 VITALS — BP 132/86 | HR 66 | Ht 63.5 in | Wt 168.6 lb

## 2022-10-07 DIAGNOSIS — Z0000 Encounter for general adult medical examination without abnormal findings: Secondary | ICD-10-CM

## 2022-10-07 MED ORDER — ROSUVASTATIN CALCIUM 5 MG PO TABS
5 MG | ORAL_TABLET | ORAL | 2 refills | Status: DC
Start: 2022-10-07 — End: 2023-05-31

## 2022-10-07 MED ORDER — FLUTICASONE PROPIONATE 50 MCG/ACT NA SUSP
50 | Freq: Every morning | NASAL | 5 refills | Status: DC
Start: 2022-10-07 — End: 2024-01-11

## 2022-10-07 MED ORDER — METOPROLOL SUCCINATE ER 25 MG PO TB24
25 | ORAL_TABLET | Freq: Every day | ORAL | 2 refills | Status: DC
Start: 2022-10-07 — End: 2023-09-04

## 2022-10-07 NOTE — Assessment & Plan Note (Signed)
 Chronic, stable  Continue current management

## 2022-10-07 NOTE — Telephone Encounter (Signed)
 Refused, sent by Edi [employer]

## 2022-10-07 NOTE — Telephone Encounter (Signed)
 Rx sent today.

## 2022-10-07 NOTE — Assessment & Plan Note (Signed)
 Chronic, stable  No recent issues

## 2022-10-07 NOTE — Patient Instructions (Signed)
 A Healthy Heart: Care Instructions  Overview     Coronary artery disease, also called heart disease, occurs when a substance called plaque builds up in the vessels that supply oxygen-rich blood to your heart muscle. This can narrow the blood vessels and reduce blood flow. A heart attack happens when blood flow is completely blocked. A high-fat diet, smoking, and other factors increase the risk of heart disease.  Your doctor has found that you have a chance of having heart disease. A heart-healthy lifestyle can help keep your heart healthy and prevent heart disease. This lifestyle includes eating healthy, being active, staying at a weight that's healthy for you, and not smoking or using tobacco. It also includes taking medicines as directed, managing other health conditions, and trying to get a healthy amount of sleep.  Follow-up care is a key part of your treatment and safety. Be sure to make and go to all appointments, and call your doctor if you are having problems. It's also a good idea to know your test results and keep a list of the medicines you take.  How can you care for yourself at home?  Diet   Use less salt when you cook and eat. This helps lower your blood pressure. Taste food before salting. Add only a little salt when you think you need it. With time, your taste buds will adjust to less salt.    Eat fewer snack items, fast foods, canned soups, and other high-salt, high-fat, processed foods.    Read food labels and try to avoid saturated and trans fats. They increase your risk of heart disease by raising cholesterol levels.    Limit the amount of solid fat--butter, margarine, and shortening--you eat. Use olive, peanut, or canola oil when you cook. Bake, broil, and steam foods instead of frying them.    Eat a variety of fruit and vegetables every day. Dark green, deep orange, red, or yellow fruits and vegetables are especially good for you. Examples include spinach, carrots, peaches, and  berries.    Foods high in fiber can reduce your cholesterol and provide important vitamins and minerals. High-fiber foods include whole-grain cereals and breads, oatmeal, beans, brown rice, citrus fruits, and apples.    Eat lean proteins. Heart-healthy proteins include seafood, lean meats and poultry, eggs, beans, peas, nuts, seeds, and soy products.    Limit drinks and foods with added sugar. These include candy, desserts, and soda pop.   Heart-healthy lifestyle   If your doctor recommends it, get more exercise. For many people, walking is a good choice. Or you may want to swim, bike, or do other activities. Bit by bit, increase the time you're active every day. Try for at least 30 minutes on most days of the week.    Try to quit or cut back on using tobacco and other nicotine products. This includes smoking and vaping. If you need help quitting, talk to your doctor about stop-smoking programs and medicines. These can increase your chances of quitting for good. Quitting is one of the most important things you can do to protect your heart. It is never too late to quit. Try to avoid secondhand smoke too.    Stay at a weight that's healthy for you. Talk to your doctor if you need help losing weight.    Try to get 7 to 9 hours of sleep each night.    Limit alcohol to 2 drinks a day for men and 1 drink a day for women. Too  much alcohol can cause health problems.    Manage other health problems such as diabetes, high blood pressure, and high cholesterol. If you think you may have a problem with alcohol or drug use, talk to your doctor.   Medicines   Take your medicines exactly as prescribed. Call your doctor if you think you are having a problem with your medicine.    If your doctor recommends aspirin, take the amount directed each day. Make sure you take aspirin and not another kind of pain reliever, such as acetaminophen  (Tylenol ).   When should you call for help?   Call 911 if you have symptoms of a heart  attack. These may include:   Chest pain or pressure, or a strange feeling in the chest.    Sweating.    Shortness of breath.    Pain, pressure, or a strange feeling in the back, neck, jaw, or upper belly or in one or both shoulders or arms.    Lightheadedness or sudden weakness.    A fast or irregular heartbeat.   After you call 911, the operator may tell you to chew 1 adult-strength or 2 to 4 low-dose aspirin. Wait for an ambulance. Do not try to drive yourself.  Watch closely for changes in your health, and be sure to contact your doctor if you have any problems.  Where can you learn more?  Go to [employer] and enter F075 to learn more about A Healthy Heart: Care Instructions.  Current as of: July 21, 2021  Content Version: 14.1   2006-2024 [employer].   Care instructions adapted under license by [facility]. If you have questions about a medical condition or this instruction, always ask your healthcare professional. [employer] Inc[employer] disclaims any warranty or liability for your use of this information.      Personalized Preventive Plan for Jodi Powell - 10/07/2022  Medicare offers a range of preventive health benefits. Some of the tests and screenings are paid in full while other may be subject to a deductible, co-insurance, and/or copay.    Some of these benefits include a comprehensive review of your medical history including lifestyle, illnesses that may run in your family, and various assessments and screenings as appropriate.    After reviewing your medical record and screening and assessments performed today your provider may have ordered immunizations, labs, imaging, and/or referrals for you.  A list of these orders (if applicable) as well as your Preventive Care list are included within your After Visit Summary for your review.    Other Preventive Recommendations:    A preventive eye exam performed by an eye specialist is recommended every 1-2  years to screen for glaucoma; cataracts, macular degeneration, and other eye disorders.  A preventive dental visit is recommended every 6 months.  Try to get at least 150 minutes of exercise per week or 10,000 steps per day on a pedometer .  Order or download the FREE Exercise & Physical Activity: Your Everyday Guide from [employer]. Call [phone] or search [employer] online.  You need 1200-1500 mg of calcium  and 1000-2000 IU of vitamin D per day. It is possible to meet your calcium  requirement with diet alone, but a vitamin D supplement is usually necessary to meet this goal.  When exposed to the sun, use a sunscreen that protects against both UVA and UVB radiation with an SPF of 30 or greater. Reapply every 2 to 3 hours or after  sweating, drying off with a towel, or swimming.  Always wear a seat belt when traveling in a car. Always wear a helmet when riding a bicycle or motorcycle.

## 2022-10-07 NOTE — Progress Notes (Signed)
 Medicare Annual Wellness Visit    Jodi Powell is here for Medicare AWV (Flu-done/Patient voiced no concerns /Colonoscopy due next year )    SAWV-HRA completed  Labs done  Flu-  MM due March  Colon-due in January      Assessment & Plan   Medicare annual wellness visit, subsequent  Essential hypertension  Assessment & Plan:  Chronic, stable  Continue current management     Orders:  -     Comprehensive Metabolic Panel; Future  -     metoprolol  succinate (TOPROL  XL) 25 MG extended release tablet; Take 1 tablet by mouth daily, Disp-90 tablet, R-2Normal  Prediabetes  Assessment & Plan:  New.   Trying to increase activity and cutting back on carbs/prosecco  Reviewed lifestyle modifications. Monitor   Orders:  -     Hemoglobin A1C; Future  Dyslipidemia  Assessment & Plan:  Chronic, stable  Continue current management     Orders:  -     Comprehensive Metabolic Panel; Future  -     Lipid Panel; Future  -     rosuvastatin  (CRESTOR ) 5 MG tablet; 1 tablet Orally Once a day for 90 days, Disp-90 tablet, R-2Normal  Chronic GERD  Assessment & Plan:  Chronic, stable  Continue current management     Nonrheumatic aortic (valve) stenosis  Assessment & Plan:  Chronic, stable  Patient s/p aortic valve replacement 06/2009 and 2022  Cardiology: Dr Mallie  Recurrent gastrointestinal hemorrhage  Assessment & Plan:  Chronic, stable  No recent issues    Cystocele with rectocele  Assessment & Plan:  Resolved  S/p repair of cystocele and rectocele 09/03/21 with Dr Waddell    Elevated glucose  Vitamin D deficiency  History of melanoma  S/P aortic valve replacement with bioprosthetic valve  Encounter for colorectal cancer screening  -     Ladean Honey, MD - Gastroenterology  Breast cancer screening by mammogram  -     MAM TOMO DIGITAL SCREEN BILATERAL (PER PROTOCOL); Future  Seasonal allergies  -     fluticasone  (FLONASE ) 50 MCG/ACT nasal spray; 1 spray by Nasal route every morning, Disp-4 each, R-5Normal  Screening for cardiovascular  condition  -     PR Intensive Behavior Counseling for Cardiovascular Diseases, 8-15 minutes [G0446]    Recommendations for Preventive Services Due: see orders and patient instructions/AVS.  Recommended screening schedule for the next 5-10 years is provided to the patient in written form: see Patient Instructions/AVS.     Return for 6 mo f/u chronic conditions.     Subjective       Patient's complete Health Risk Assessment and screening values have been reviewed and are found in Flowsheets. The following problems were reviewed today and where indicated follow up appointments were made and/or referrals ordered.    No Positive Risk Factors identified today.    CV Risk Counseling:  Patient was asked about her current diet and exercise habits, and personalized advice was provided regarding recommended lifestyle changes. Patient's individual cardiovascular disease risk factors, including advanced age (> 30 for men, > 65 for women), dyslipidemia, and hypertension, were discussed, as well as the likely benefits of lifestyle changes. Based upon patient's motivation to change her behavior, the following plan was agreed upon to work toward lowering cardiovascular disease risk: low saturated fat, low cholesterol diet, Mediterranean diet, DASH diet, and increase physical activity, as tolerated.  Aspirin use for primary prevention of cardiovascular disease for men 45-79 and women 55-79: Indicated- continue daily  aspirin. Educational materials for lifestyle changes were provided. Patient will follow-up in 6 month(s) with PCP. Provider spent 8 minutes counseling patient.                                Objective   Vitals:    10/07/22 1009   BP: 132/86   Pulse: 66   SpO2: 96%   Weight: 76.5 kg (168 lb 9.6 oz)   Height: 1.613 m (5' 3.5)      Body mass index is 29.4 kg/m.      GENERAL APPEARANCE: Patient is alert and oriented. No acute distress  EYES: pupils equal, round, reactive to light. No scleral icterus  LYMPH NODES: no  lymphadenopathy.   HEART: no murmurs, normal rate and regular rhythm.   LUNGS: clear to auscultation bilaterally, good air movement, no wheezes, rales, rhonchi.   SKIN: good turgor, no rashes noted, warm and dry.   MUSCULOSKELETAL: full range of motion, no swelling or deformity.   EXTREMITIES: no edema, clubbing or cyanosis.   NEUROLOGIC: nonfocal, motor strength normal upper and lower extremities, sensory exam intact.   PSYCH: alert, oriented, cognitive function intact, judgment and insight good, mood/affect appropriate.                 Allergies   Allergen Reactions    Latex Rash    Sulfa Antibiotics Rash and Headaches    Seasonal     Adhesive Tape Rash     Prior to Visit Medications    Medication Sig Taking? Authorizing Provider   rosuvastatin  (CRESTOR ) 5 MG tablet 1 tablet Orally Once a day for 90 days Yes Emilio Luster HERO, MD   metoprolol  succinate (TOPROL  XL) 25 MG extended release tablet Take 1 tablet by mouth daily Yes Demetric Dunnaway M, MD   fluticasone  (FLONASE ) 50 MCG/ACT nasal spray 1 spray by Nasal route every morning Yes Emilio Luster HERO, MD   amLODIPine  (NORVASC ) 5 MG tablet 1 tablet Orally Once a day for 90 days Yes Meliton Sawyer, NP-C   spironolactone  (ALDACTONE ) 25 MG tablet TAKE 1/2 TABLET BY MOUTH EVERY OTHER DAY Yes Karas, Kalli Hope, PA-C   niacinamide 500 MG tablet Take 1 tablet by mouth 2 times daily (with meals) Yes [provider]   pantoprazole  (PROTONIX ) 40 MG tablet Take 1 tablet by mouth daily  Patient taking differently: Take 1 tablet by mouth every morning Yes Meliton Sawyer, NP-C   losartan -hydroCHLOROthiazide (HYZAAR) 100-25 MG per tablet Take 1 tablet by mouth daily  Patient taking differently: Take 1 tablet by mouth every morning Yes Meliton Sawyer, NP-C   aspirin 81 MG EC tablet Take 1 tablet by mouth every morning Yes [provider]   EPINEPHrine  (EPIPEN ) 0.3 MG/0.3ML SOAJ injection Inject 0.3 mLs into the muscle as needed (ANAPHYLAXIS) Yes [provider]   Cholecalciferol (VITAMIN D) 50 MCG (2000 UT) CAPS capsule Take 1 capsule by mouth daily Yes [provider]   Azelastine HCl 137 MCG/SPRAY SOLN   [provider]     Outside of dedicated time spent for SAWV, cardiovascular disease and/or obesity counseling, an additional 35 minutes were spent on this case on the day of the encounter, including preparation to see the patient, review of results, meeting with patient, addressing acute concerns, discussion of care, orders and documentation.    CareTeam (Including outside providers/suppliers regularly involved in providing care):   Patient Care Team:  Emilio,  Luster HERO, MD as PCP - General  Emilio Luster HERO, MD as PCP - Empaneled Provider  Mallie Repress, MD as Consulting Physician (Internal Medicine)      Reviewed and updated this visit:  Tobacco  Allergies  Meds  Problems  Med Hx  Surg Hx  Soc Hx  Fam Hx

## 2022-10-07 NOTE — Assessment & Plan Note (Addendum)
 Chronic, stable  Patient s/p aortic valve replacement 06/2009 and 2022  Cardiology: Dr Claris Gladden

## 2022-10-07 NOTE — Assessment & Plan Note (Addendum)
 New.   Trying to increase activity and cutting back on carbs/prosecco  Reviewed lifestyle modifications. Monitor

## 2022-10-07 NOTE — Telephone Encounter (Signed)
 Patient has AWV scheduled today, defer to MD.

## 2022-10-07 NOTE — Telephone Encounter (Signed)
 Insufficient documentation in the last 6 months OV/dx to determine if criteria is met. Sent to practice for provider discretion.    Patient has AWV scheduled today.

## 2022-10-07 NOTE — Telephone Encounter (Signed)
 Insufficient documentation in the last 6 months OV/dx to determine if criteria is met. Sent to practice for provider discretion.

## 2022-10-07 NOTE — Assessment & Plan Note (Signed)
 Resolved  S/p repair of cystocele and rectocele 09/03/21 with Dr Ladona Ridgel

## 2022-11-16 NOTE — Telephone Encounter (Signed)
Called and left a voicemail regarding appt on 12/30/22 being changed from [facility] office to the [facility] office. Mychart message sent as well.

## 2022-12-06 ENCOUNTER — Encounter

## 2022-12-06 NOTE — Telephone Encounter (Signed)
 Refused requested by [employer]

## 2022-12-24 ENCOUNTER — Encounter: Admit: 2022-12-24 | Admitting: Family

## 2022-12-24 DIAGNOSIS — I10 Essential (primary) hypertension: Secondary | ICD-10-CM

## 2022-12-24 NOTE — Telephone Encounter (Signed)
 Refused requested by [employer]

## 2022-12-28 ENCOUNTER — Encounter: Admit: 2022-12-28 | Admitting: Family

## 2022-12-28 DIAGNOSIS — K219 Gastro-esophageal reflux disease without esophagitis: Secondary | ICD-10-CM

## 2022-12-28 DIAGNOSIS — I10 Essential (primary) hypertension: Secondary | ICD-10-CM

## 2022-12-30 ENCOUNTER — Encounter: Admit: 2022-12-30 | Payer: MEDICARE | Admitting: Internal Medicine | Primary: Family Medicine

## 2022-12-30 VITALS — BP 122/72 | HR 71 | Resp 16 | Ht 63.5 in | Wt 169.0 lb

## 2022-12-30 DIAGNOSIS — I350 Nonrheumatic aortic (valve) stenosis: Principal | ICD-10-CM

## 2022-12-30 MED ORDER — PANTOPRAZOLE SODIUM 40 MG PO TBEC
40 | ORAL_TABLET | Freq: Every day | ORAL | 1 refills | Status: AC
Start: 2022-12-30 — End: ?

## 2022-12-30 MED ORDER — LOSARTAN POTASSIUM-HCTZ 100-25 MG PO TABS
100-25 | ORAL_TABLET | Freq: Every day | ORAL | 2 refills | Status: DC
Start: 2022-12-30 — End: 2023-10-06

## 2022-12-30 NOTE — Telephone Encounter (Signed)
 Per protocol criteria for refill reviewed and met, refill sent per physician ordered protocol.

## 2022-12-30 NOTE — Progress Notes (Signed)
 Date:  December 30, 2022  Patient name: Jodi Powell  Date of Birth: [birth year]        REASON FOR VISIT: Routine follow-up visit      SUBJECTIVE HISTORY:         The patient is here for routine follow-up.  Since last seen she is doing extremely well

## 2022-12-30 NOTE — Telephone Encounter (Signed)
LOV 10/07/22  NOV 04/21/23  Script and pharmacy verified and ready to send

## 2022-12-30 NOTE — Telephone Encounter (Signed)
 Order has medication warning when attempting to fill requiring override reason. Per protocol/nurse discretion sent to practice for provider discretion/refill.

## 2022-12-30 NOTE — Patient Instructions (Signed)
 Echo in 1 year   Follow up in 1 year

## 2023-01-06 ENCOUNTER — Encounter: Admit: 2023-01-06 | Payer: MEDICARE | Admitting: Family Medicine | Primary: Family Medicine

## 2023-01-06 VITALS — BP 118/72 | HR 69 | Ht 63.5 in | Wt 173.1 lb

## 2023-01-06 DIAGNOSIS — M5431 Sciatica, right side: Principal | ICD-10-CM

## 2023-01-06 MED ORDER — TIZANIDINE HCL 2 MG PO TABS
2 | ORAL_TABLET | Freq: Three times a day (TID) | ORAL | 0 refills | 15.00000 days | Status: DC | PRN
Start: 2023-01-06 — End: 2024-01-01

## 2023-01-06 NOTE — Progress Notes (Signed)
 Jodi Powell (DOB:  1950/08/15) is a 72 y.o. female here for evaluation of the following chief complaint(s):  Other (Patient c/o Ongoing Sciatica symptoms)    Sciatica symptoms          10/01/2022     1:16 PM   PHQ-9    Little interest or pleasure in doin

## 2023-01-06 NOTE — Assessment & Plan Note (Signed)
 Acute on chronic  She reports ongoing sciatica that periodically flares throughout the years.  No recent flares since she moved from [place] to [place].   She was recently traveling and on a plane flight.   She does home exercises/stretching which he

## 2023-01-06 NOTE — Assessment & Plan Note (Signed)
 Chronic, stable  Continue current management

## 2023-03-26 ENCOUNTER — Encounter

## 2023-03-26 LAB — COMPREHENSIVE METABOLIC PANEL
ALT: 20 U/L (ref 0–42)
AST: 27 U/L (ref 0–46)
Albumin/Globulin Ratio: 1.7 (ref 1.00–2.70)
Albumin: 4.3 g/dL (ref 3.5–5.2)
Alk Phosphatase: 87 U/L (ref 35–117)
Anion Gap: 15 mmol/L (ref 2–17)
BUN: 20 mg/dL (ref 8–23)
CO2: 24 mmol/L (ref 22–29)
Calcium: 10.1 mg/dL (ref 8.5–10.7)
Chloride: 97 mmol/L — ABNORMAL LOW (ref 98–107)
Creatinine: 0.9 mg/dL (ref 0.5–1.0)
Est, Glom Filt Rate: 68 mL/min/1.73mÂ² (ref >=60)
Globulin: 2.5 g/dL (ref 1.9–4.4)
Glucose: 108 mg/dL — ABNORMAL HIGH (ref 70–99)
Osmolaliy Calculated: 275 mosm/kg (ref 270–287)
Potassium: 4.3 mmol/L (ref 3.5–5.3)
Sodium: 136 mmol/L (ref 135–145)
Total Bilirubin: 0.95 mg/dL (ref 0.00–1.20)
Total Protein: 6.8 g/dL (ref 5.7–8.3)

## 2023-03-26 LAB — LIPID PANEL
Chol/HDL Ratio: 2.3 (ref 0.0–4.4)
Cholesterol, Total: 177 mg/dL (ref 100–200)
HDL: 76 mg/dL (ref >=50)
LDL Cholesterol: 80.2 mg/dL (ref 0.0–100.0)
LDL/HDL Ratio: 1.1
Triglycerides: 104 mg/dL (ref 0–149)
VLDL: 20.8 mg/dL (ref 5.0–40.0)

## 2023-03-26 LAB — HEMOGLOBIN A1C
Estimated Avg Glucose: 117
Estimated Avg Glucose: 126
Hemoglobin A1C: 5.7 % (ref 4.0–6.0)

## 2023-03-29 NOTE — Other (Signed)
 Results were reviewed and will be discussed at next appointment.

## 2023-03-31 ENCOUNTER — Inpatient Hospital Stay
Admit: 2023-03-31 | Discharge: 2023-04-07 | Payer: PRIVATE HEALTH INSURANCE | Attending: Internal Medicine | Primary: Family Medicine

## 2023-03-31 VITALS — BP 118/72 | HR 69 | Ht 63.5 in | Wt 173.0 lb

## 2023-03-31 DIAGNOSIS — I350 Nonrheumatic aortic (valve) stenosis: Secondary | ICD-10-CM

## 2023-04-04 LAB — ECHO (TTE) COMPLETE (PRN CONTRAST/BUBBLE/STRAIN/3D)
AV Area by Peak Velocity: 1 cm2
AV Area by VTI: 1.1 cm2
AV Mean Gradient: 10 mmHg
AV Mean Velocity: 1.7 m/s
AV Peak Gradient: 26 mmHg
AV Peak Velocity: 2.6 m/s
AV Regurg Fraction: -1917 %
AV Regurg Volume: -1292.7 mL
AV VTI: 62.9 cm
AV Velocity Ratio: 0.35
AVA/BSA Peak Velocity: 0.5 cm2/m2
AVA/BSA VTI: 0.6 cm2/m2
Ao Root Index: 1.53 cm/m2
Aortic Arch: 2.1 cm
Aortic Root: 2.8 cm
Aortic Sinus Valsalva Index: 1.42 cm/m2
Aortic Sinus Valsalva: 2.6 cm
Ascending Aorta Index: 1.53 cm/m2
Ascending Aorta: 2.8 cm
Body Surface Area: 1.87 m2
Descending Aorta Index: 1.09 cm/m2
Descending Aorta: 2 cm
E/E' Lateral: 9.91
E/E' Ratio (Averaged): 11.54
E/E' Septal: 13.17
EF BP: 63 % (ref 55–100)
EF Physician: 60 %
Est. RA Pressure: 3 mmHg
Fractional Shortening 2D: 40 % (ref 28–44)
IVC Expiration: 1.3 cm
IVSd: 0.7 cm (ref 0.6–0.9)
LA Area 2C: 19.3 cm2
LA Area 4C: 16.3 cm2
LA Diameter: 3.3 cm
LA Major Axis: 5.2 cm
LA Minor Axis: 5.6 cm
LA Size Index: 1.8 cm/m2
LA Volume BP: 48 mL (ref 22–52)
LA Volume Index BP: 26 mL/m2 (ref 16–34)
LA Volume Index MOD A2C: 30 mL/m2 (ref 16–34)
LA Volume Index MOD A4C: 22 mL/m2 (ref 16–34)
LA Volume MOD A2C: 55 mL — AB (ref 22–52)
LA Volume MOD A4C: 40 mL (ref 22–52)
LA/AO Root Ratio: 1.18
LV E' Lateral Velocity: 9.38 cm/s
LV E' Septal Velocity: 7.06 cm/s
LV EDV A2C: 54 mL
LV EDV A4C: 62 mL
LV EDV Index A2C: 30 mL/m2
LV EDV Index A4C: 34 mL/m2
LV ESV A2C: 21 mL
LV ESV A4C: 23 mL
LV ESV Index A2C: 11 mL/m2
LV ESV Index A4C: 13 mL/m2
LV Ejection Fraction A2C: 62 %
LV Ejection Fraction A4C: 63 %
LV Mass 2D Index: 52.3 g/m2 (ref 43–95)
LV Mass 2D: 95.7 g (ref 67–162)
LV RWT Ratio: 0.31
LVIDd Index: 2.46 cm/m2
LVIDd: 4.5 cm (ref 3.9–5.3)
LVIDs Index: 1.48 cm/m2
LVIDs: 2.7 cm
LVOT Area: 2.8 cm2
LVOT Diameter: 1.9 cm
LVOT Mean Gradient: 2 mmHg
LVOT Peak Gradient: 3 mmHg
LVOT Peak Velocity: 0.9 m/s
LVOT SV: 67.4 mL
LVOT Stroke Volume Index: 36.9 mL/m2
LVOT VTI: 23.8 cm
LVOT:AV VTI Index: 0.38
LVPWd: 0.7 cm (ref 0.6–0.9)
MR Peak Gradient: 139 mmHg
MR Peak Velocity: 5.9 m/s
MR Radius PISA: 0.5 cm
MR Regurg Volume: 1292.68 cm3
MR VTI: 221 cm
MV A Velocity: 1.27 m/s
MV Annulus Diameter: 2.8 cm
MV Area by VTI: 1.9 cm2
MV E Velocity: 0.93 m/s
MV E Wave Deceleration Time: 211 ms
MV E/A: 0.73
MV Max Velocity: 1.3 m/s
MV Mean Gradient: 2 mmHg
MV Mean Velocity: 0.7 m/s
MV Nyquist Velocity: 39 cm/s
MV Peak Gradient: 7 mmHg
MV Regurg Fraction Cont Eq: 95 %
MV VTI: 36.2 cm
MV:LVOT VTI Index: 1.52
RA Area 4C: 25.3 cm2
RV Basal Dimension: 4.2 cm
RV Mid Dimension: 3.1 cm
RVIDd: 4.2 cm
RVOT Area: 4.5 cm2
RVOT Diameter: 2.4 cm
RVSP: 34 mmHg
TR Max Velocity: 2.8 m/s
TR Peak Gradient: 36 mmHg

## 2023-04-10 ENCOUNTER — Inpatient Hospital Stay: Admit: 2023-04-10 | Payer: PRIVATE HEALTH INSURANCE | Attending: Family Medicine | Primary: Family Medicine

## 2023-04-10 DIAGNOSIS — Z1231 Encounter for screening mammogram for malignant neoplasm of breast: Secondary | ICD-10-CM

## 2023-04-21 ENCOUNTER — Ambulatory Visit
Admit: 2023-04-21 | Discharge: 2023-04-21 | Payer: PRIVATE HEALTH INSURANCE | Attending: Family Medicine | Primary: Family Medicine

## 2023-04-21 VITALS — BP 121/72 | HR 63 | Ht 63.5 in | Wt 168.0 lb

## 2023-04-21 DIAGNOSIS — I10 Essential (primary) hypertension: Secondary | ICD-10-CM

## 2023-04-21 NOTE — Progress Notes (Addendum)
 Jodi Powell (DOB:  19-May-1950) is a 73 y.o. female here for evaluation of the following:  6 Month Follow-Up    6 mth F/U-chronic conditions  Labs done 03/26/23  [place] due, ref to [facility] 10/07/22, closed  PHQ2  SAWV sch 10/13/23, ORDER labs    Seeing Dr Dennie Fetters in April for GI consults      History of Present Illness    Allergic Rhinitis  - The patient's allergy symptoms have intensified, necessitating bi-weekly allergen immunotherapy injections.  - She is currently under the care of an otolaryngologist.    Weight Management  - Dietary modifications, including the elimination of bread, have resulted in weight loss.  - Her daily fluid intake consists of one cup of coffee, 3-4 bottles of water, and a small amount of juice consumed with her morning medications.    Hypertension Management  - Her blood pressure is well-controlled with her current antihypertensive medication.    Arteriovenous Malformation (AVM)  - The patient has a history of AVM with two episodes of hemorrhage requiring blood transfusions.           04/21/2023    10:53 AM   PHQ-9    Little interest or pleasure in doing things 0   Feeling down, depressed, or hopeless 0   PHQ-2 Score 0   PHQ-9 Total Score 0      Past Medical History:   Diagnosis Date    Acid reflux     Allergies     cats, mold, mildew, dust    Cystocele with rectocele     History of rosacea     Hx of melanoma of skin     RIGHT ANKLE, LEFT FOOT, LEFT KNEE    Hyperlipidemia     Hypertension     Prediabetes 10/07/2022    Recurrent gastrointestinal hemorrhage     AV malformations    Thyroid nodule 12/20/2019    Right         Past Surgical History:   Procedure Laterality Date    AORTIC VALVE REPLACEMENT  06/2009    AORTIC VALVE REPLACEMENT  02/09/2020    TAVR - DR. ROSS    APPENDECTOMY  1958    BILATERAL OOPHORECTOMY  2010    BREAST BIOPSY Right 2001    Benign / incisional    CARDIAC CATHETERIZATION  01/2020    Dr. Aggie Cosier    CARDIAC VALVE REPLACEMENT      COLONOSCOPY  2021    HYSTERECTOMY,  VAGINAL  1988    PERINEAL SURGERY N/A 09/03/2021    PERINEAL REPAIR performed by Shelda Altes, MD at [facility] MAIN OR    TONSILLECTOMY  1956    UPPER GASTROINTESTINAL ENDOSCOPY  12/2019    Dr. Claris Gladden    VAGINA SURGERY N/A 09/03/2021    VAGINAL REPAIR ANTERIOR AND POSTERIOR VAGINAL SUSPENSION performed by Shelda Altes, MD at [facility] MAIN OR    VAGINA SURGERY N/A 07/01/2022    VAGINAL COLPOCLEISIS performed by Shelda Altes, MD at [facility] MAIN OR    VAGINA SURGERY N/A 07/01/2022    VAGINAL REPAIR ANTERIOR AND POSTERIOR performed by Shelda Altes, MD at [facility] MAIN OR    VAGINA SURGERY N/A 07/01/2022    VAGINAL POSTERIOR REPAIR performed by Shelda Altes, MD at [facility] MAIN OR         Allergies:  Allergies   Allergen Reactions    Latex Rash    Sulfa Antibiotics Rash and Headaches    Seasonal  Adhesive Tape Rash       Current Outpatient Medications   Medication Instructions    albuterol sulfate HFA (PROVENTIL;VENTOLIN;PROAIR) 108 (90 Base) MCG/ACT inhaler TAKE 2 PUFFS BY MOUTH EVERY 4 TO 6 HOURS AS NEEDED    amLODIPine (NORVASC) 5 MG tablet 1 tablet Orally Once a day for 90 days    aspirin 81 mg, EVERY MORNING    Azelastine HCl 137 MCG/SPRAY SOLN as needed    Cephalexin 500 MG TABS TAKE 4 TABS 1 HOUR PRIOR TO ANY DENTAL PROCEDURE    Cholecalciferol (VITAMIN D) 50 MCG (2000 UT) CAPS capsule 1 capsule, DAILY    doxycycline hyclate (PERIOSTAT) 20 mg, 2 TIMES DAILY    EPINEPHrine (EPIPEN) 0.3 MG/0.3ML SOAJ injection Inject 0.3 mLs into the muscle as needed (ANAPHYLAXIS)    fluticasone (FLONASE) 50 MCG/ACT nasal spray 1 spray, Nasal, EVERY MORNING    losartan-hydroCHLOROthiazide (HYZAAR) 100-25 MG per tablet 1 tablet, Oral, DAILY    metoprolol succinate (TOPROL XL) 25 mg, Oral, DAILY    niacinamide 500 mg, 2 TIMES DAILY WITH MEALS    pantoprazole (PROTONIX) 40 mg, Oral, DAILY    rosuvastatin (CRESTOR) 5 MG tablet 1 tablet Orally Once a day for 90 days    spironolactone (ALDACTONE) 25 MG tablet TAKE 1/2 TABLET BY MOUTH EVERY  OTHER DAY    tiZANidine (ZANAFLEX) 2 mg, Oral, 3 TIMES DAILY PRN          Vitals:    04/21/23 1051   BP: 121/72   BP Site: Right Upper Arm   Patient Position: Sitting   BP Cuff Size: Large Adult   Pulse: 63   SpO2: 96%   Weight: 76.2 kg (168 lb)   Height: 1.613 m (5' 3.5")       All other systems reviewed and are negative unless otherwise stated.          Physical Exam  GENERAL APPEARANCE: well-developed, well-nourished,alert and oriented, no acute distress.   HEAD: normocephalic, atraumatic .   EYES: sclera non-icteric .   ORAL CAVITY: mucosa moist, no lesions .   LUNGS: normal respiratory effort, no wheezing heard.   SKIN: no rash.   NEUROLOGIC: grossly intact, normal speech.       ASSESSMENT/PLAN:  1. Essential hypertension  -     CBC; Future  -     TSH reflex to FT4; Future  2. Dyslipidemia  -     Comprehensive Metabolic Panel; Future  -     Lipid Panel; Future  3. Prediabetes  -     Hemoglobin A1C; Future  4. Chronic GERD  5. Nonrheumatic aortic (valve) stenosis  Overview:  Cardiology: Dr Claris Gladden  6. Seasonal allergies  7. Vitamin D deficiency  -     Vitamin D 25 Hydroxy; Future  8. Recurrent gastrointestinal hemorrhage  Overview:  AV malformations  9. S/P aortic valve replacement with bioprosthetic valve  10. History of melanoma  Overview:  Hx of melanoma x 2  Derm: Dr Myer Haff  11. Encounter for colorectal cancer screening    Assessment & Plan  1. Allergies  - Increased allergy shots to twice weekly due to pollen  - Continue current regimen and monitor for adverse reactions    2. Weight management  - A1c improved from 5.9 to 5.7  - Maintain dietary modifications and hydration (=65 oz water daily)    3. Blood pressure management  - Blood pressure within normal range  - Prescription refill for amlodipine in July  4. AVM  - History of AVM with two bleeding episodes requiring transfusion  - Consultation with Dr. Dennie Fetters in April for colonoscopy    Multiple chronic conditions were reviewed with patient. Recent  labs reviewed. All issues not specifically address in the note above are chronic and stable. Continue to check related lab work where appropriate. Medications refilled where appropriate.       Follow-up  - Scheduled for follow-up visit on 10/13/2023       Return for As scheduled.    The patient (or guardian, if applicable) and other individuals in attendance with the patient were advised that Artificial Intelligence will be utilized during this visit to record and process the conversation to generate a clinical note. The patient (or guardian, if applicable) and other individuals in attendance at the appointment consented to the use of AI, including the recording.    I confirm that I have managed the broad scope of the patient's health needs by furnishing care for some or all the patient's acute and/or chronic conditions across a spectrum of diagnoses and organ systems that will require ongoing care with myself or someone on my team.             Electronically signed by:  --Malena Edman, MD

## 2023-05-31 ENCOUNTER — Encounter

## 2023-06-02 MED ORDER — ROSUVASTATIN CALCIUM 5 MG PO TABS
5 | ORAL_TABLET | ORAL | 1 refills | 90.00000 days | Status: AC
Start: 2023-06-02 — End: ?

## 2023-06-02 NOTE — Telephone Encounter (Signed)
 Per protocol criteria met for refill.  Refill sent per physician ordered protocol. See signed orders.

## 2023-06-26 ENCOUNTER — Encounter

## 2023-06-26 MED ORDER — PANTOPRAZOLE SODIUM 40 MG PO TBEC
40 | ORAL_TABLET | Freq: Every day | ORAL | 1 refills | 90.00000 days | Status: DC
Start: 2023-06-26 — End: 2023-12-29

## 2023-06-26 NOTE — Telephone Encounter (Signed)
 TE to [place].  Rx set up to send, #90 w/1 refill.  LOV: 04/21/23  NOV: 10/13/23

## 2023-08-06 ENCOUNTER — Encounter

## 2023-08-06 MED ORDER — AMLODIPINE BESYLATE 5 MG PO TABS
5 | ORAL_TABLET | ORAL | 3 refills | Status: AC
Start: 2023-08-06 — End: ?

## 2023-08-06 NOTE — Telephone Encounter (Signed)
 TE to [place].  Rx set up to send, #90 w/ 3 refills.  LOV: 04/21/23  NOV: 10/13/23

## 2023-08-13 ENCOUNTER — Encounter

## 2023-08-13 NOTE — Telephone Encounter (Signed)
 SWP, advise her to contact pharmacy, refill was sent on 08/06/23   With 90 days supply and 3 refills . Patient voiced understanding and will call pharmacy to verify

## 2023-09-04 ENCOUNTER — Encounter

## 2023-09-04 MED ORDER — METOPROLOL SUCCINATE ER 25 MG PO TB24
25 | ORAL_TABLET | Freq: Every day | ORAL | 0 refills | 90.00000 days | Status: DC
Start: 2023-09-04 — End: 2023-12-10

## 2023-09-04 NOTE — Telephone Encounter (Signed)
 Refused, sent by Edi [employer]

## 2023-09-04 NOTE — Telephone Encounter (Signed)
 Per protocol criteria for refill reviewed and met, refill sent per physician ordered protocol.

## 2023-09-19 NOTE — Telephone Encounter (Signed)
 Refused sent by Edie Surescripts

## 2023-09-25 ENCOUNTER — Encounter

## 2023-09-25 LAB — CBC
Hematocrit: 41.7 % (ref 34.0–47.0)
Hemoglobin: 14.1 g/dL (ref 11.5–15.7)
MCH: 32.2 pg (ref 27.0–34.5)
MCHC: 33.8 g/dL (ref 30.0–36.0)
MCV: 95.2 fL (ref 81.0–99.0)
MPV: 10.7 fL (ref 7.0–12.2)
NRBC Absolute: 0 x10e3/mcL (ref 0.000–0.012)
NRBC Automated: 0 % (ref 0.0–0.2)
Platelets: 236 x10e3/mcL (ref 140–440)
RBC: 4.38 x10e6/mcL (ref 3.60–5.20)
RDW: 12.6 % (ref 10.0–17.0)
WBC: 6.2 x10e3/mcL (ref 3.8–10.6)

## 2023-09-25 LAB — COMPREHENSIVE METABOLIC PANEL
ALT: 22 U/L (ref 0–42)
AST: 26 U/L (ref 0–46)
Albumin/Globulin Ratio: 2 (ref 1.00–2.70)
Albumin: 4.7 g/dL (ref 3.5–5.2)
Alk Phosphatase: 88 U/L (ref 35–117)
Anion Gap: 16 mmol/L (ref 2–17)
BUN: 24 mg/dL — ABNORMAL HIGH (ref 8–23)
CO2: 26 mmol/L (ref 22–29)
Calcium: 10.2 mg/dL (ref 8.5–10.7)
Chloride: 95 mmol/L — ABNORMAL LOW (ref 98–107)
Creatinine: 0.9 mg/dL (ref 0.5–1.0)
Est, Glom Filt Rate: 67 mL/min/1.73m (ref >=60)
Globulin: 2.3 g/dL (ref 1.9–4.4)
Glucose: 108 mg/dL — ABNORMAL HIGH (ref 70–99)
Osmolaliy Calculated: 278 mosm/kg (ref 270–287)
Potassium: 4.3 mmol/L (ref 3.5–5.3)
Sodium: 137 mmol/L (ref 135–145)
Total Bilirubin: 1.18 mg/dL (ref 0.00–1.20)
Total Protein: 7 g/dL (ref 5.7–8.3)

## 2023-09-25 LAB — HEMOGLOBIN A1C
Estimated Avg Glucose: 120
Estimated Avg Glucose: 129
Hemoglobin A1C: 5.8 % (ref 4.0–6.0)

## 2023-09-25 LAB — LIPID PANEL
Chol/HDL Ratio: 2.2 (ref 0.0–4.4)
Cholesterol, Total: 181 mg/dL (ref 100–200)
HDL: 81 mg/dL (ref >=50)
LDL Cholesterol: 80.2 mg/dL (ref 0.0–100.0)
LDL/HDL Ratio: 1
Triglycerides: 99 mg/dL (ref 0–149)
VLDL: 19.8 mg/dL (ref 5.0–40.0)

## 2023-09-25 LAB — TSH REFLEX TO FT4: TSH: 1.4 u[IU]/mL (ref 0.358–3.740)

## 2023-09-25 LAB — VITAMIN D 25 HYDROXY: Vit D, 25-Hydroxy: 107 ng/mL — ABNORMAL HIGH (ref 30.0–90.0)

## 2023-09-26 NOTE — Other (Signed)
 Results were reviewed and will be discussed at next appointment.

## 2023-10-06 ENCOUNTER — Encounter

## 2023-10-06 NOTE — Telephone Encounter (Signed)
 Order has medication warning when attempting to fill requiring override reason. Per protocol/nurse discretion sent to practice for provider discretion/refill.

## 2023-10-07 MED ORDER — LOSARTAN POTASSIUM-HCTZ 100-25 MG PO TABS
100-25 | ORAL_TABLET | Freq: Every day | ORAL | 1 refills | Status: AC
Start: 2023-10-07 — End: ?

## 2023-10-07 NOTE — Telephone Encounter (Signed)
 LOV 04/21/23  NOV 10/13/23  Script and pharmacy verified

## 2023-10-13 ENCOUNTER — Ambulatory Visit
Admit: 2023-10-13 | Discharge: 2023-10-13 | Payer: Medicare (Managed Care) | Attending: Family Medicine | Primary: Family Medicine

## 2023-10-13 VITALS — BP 110/74 | HR 70 | Ht 63.5 in | Wt 168.0 lb

## 2023-10-13 DIAGNOSIS — Z1231 Encounter for screening mammogram for malignant neoplasm of breast: Principal | ICD-10-CM

## 2023-10-13 NOTE — Assessment & Plan Note (Signed)
 Chronic, stable  Continue current management

## 2023-10-13 NOTE — Assessment & Plan Note (Signed)
 Acute  She reports no recent change in vitamin D dose or how she is taking the medication  Advised to hold off on vitamin D for several months and then can restart  Recheck vitamin D level with next labs

## 2023-10-13 NOTE — Progress Notes (Signed)
 Medicare Annual Wellness Visit    Jodi Powell is here for Medicare AWV    SAWV-HRA completed  Labs done  Prev 20-  MM/DEXA due 03/2024-ordered today  *update PNA vaccines in care gaps!    Assessment & Plan    1. Medicare annual wellness visit, subsequent  2. Essential hypertension  Assessment & Plan:  Chronic, stable  Continue current management  Orders:  -     Comprehensive Metabolic Panel; Future  3. Dyslipidemia  Assessment & Plan:  Chronic, stable  Continue current management  Orders:  -     Comprehensive Metabolic Panel; Future  -     Lipid Panel; Future  4. Chronic GERD  Assessment & Plan:  Chronic, stable  Continue current management  5. Prediabetes  -     Hemoglobin A1C; Future  6. Nonrheumatic aortic (valve) stenosis  Overview:  Cardiology: Dr Mallie  7. Seasonal allergies  Assessment & Plan:  Chronic, ongoing  Getting allergy shots with Allergist  8. Recurrent gastrointestinal hemorrhage  Overview:  AV malformations  Assessment & Plan:  History of AVM with two bleeding episodes requiring transfusion  Followed by Dr. Alecia w/ GI. UTD on colonoscopies   9. High vitamin D level  Assessment & Plan:  Acute  She reports no recent change in vitamin D dose or how she is taking the medication  Advised to hold off on vitamin D for several months and then can restart  Recheck vitamin D level with next labs   10. Postmenopausal estrogen deficiency  -     DEXA BONE DENSITY AXIAL SKELETON; Future  11. Breast cancer screening by mammogram  -     MAM TOMO DIGITAL SCREEN BILATERAL (PER PROTOCOL); Future  12. S/P aortic valve replacement with bioprosthetic valve  13. History of melanoma  Overview:  Hx of melanoma x 2  Derm: Dr Clois  14. Vitamin D deficiency  -     Vitamin D 25 Hydroxy; Future  15. Screening for cardiovascular condition  -     PR Intensive Behavior Counseling for Cardiovascular Diseases, 8-15 minutes [G0446]             Return for 6 mo f/u chronic conditions.     Subjective         Patient's  complete Health Risk Assessment and screening values have been reviewed and are found in Flowsheets. The following problems were reviewed today and where indicated follow up appointments were made and/or referrals ordered.    Positive Risk Factor Screenings with Interventions:                    Safety:  Do you have non-slip mats or non-slip surfaces or shower bars or grab bars in your shower or bathtub?: (!) No  Interventions:  See AVS for additional education material        CV Risk Counseling:  Patient was asked about her current diet and exercise habits, and personalized advice was provided regarding recommended lifestyle changes. Patient's individual cardiovascular disease risk factors, including advanced age (> 42 for men, > 65 for women), dyslipidemia, hypertension, and sedentary lifestyle, were discussed, as well as the likely benefits of lifestyle changes. Based upon patient's motivation to change her behavior, the following plan was agreed upon to work toward lowering cardiovascular disease risk: low saturated fat, low cholesterol diet, Mediterranean diet, DASH diet, and at least 150 minutes of exercise/week.  Aspirin use for primary prevention of cardiovascular disease for men 45-79  and women 55-79: Indicated- continue daily aspirin. Educational materials for lifestyle changes were provided. Patient will follow-up in 6 month(s) with PCP. Provider spent 8 minutes counseling patient.            Objective   Vitals:    10/13/23 0939   BP: 110/74   BP Site: Right Upper Arm   Patient Position: Sitting   BP Cuff Size: Medium Adult   Pulse: 70   SpO2: 97%   Weight: 76.2 kg (168 lb)   Height: 1.613 m (5' 3.5)      Body mass index is 29.29 kg/m.      GENERAL APPEARANCE: Patient is alert and oriented. No acute distress  EYES: pupils equal, round, reactive to light. No scleral icterus  LYMPH NODES: no lymphadenopathy.   HEART: no murmurs, normal rate and regular rhythm.   LUNGS: clear to auscultation bilaterally,  good air movement, no wheezes, rales, rhonchi.   SKIN: good turgor, no rashes noted, warm and dry.   MUSCULOSKELETAL: full range of motion, no swelling or deformity.   EXTREMITIES: no edema, clubbing or cyanosis.   NEUROLOGIC: nonfocal, motor strength normal upper and lower extremities, sensory exam intact.   PSYCH: alert, oriented, cognitive function intact, judgment and insight good, mood/affect appropriate.              Allergies   Allergen Reactions    Latex Rash    Sulfa Antibiotics Rash and Headaches    Environmental/Seasonal     Adhesive Tape Rash     Prior to Visit Medications   Medication Sig Taking? Authorizing Provider   losartan -hydroCHLOROthiazide (HYZAAR) 100-25 MG per tablet Take 1 tablet by mouth daily Yes Caldwell, Tara, NP-C   metoprolol  succinate (TOPROL  XL) 25 MG extended release tablet Take 1 tablet by mouth daily Yes Jazia Faraci M, MD   amLODIPine  (NORVASC ) 5 MG tablet 1 tablet Orally Once a day for 90 days Yes Meliton Sawyer, NP-C   pantoprazole  (PROTONIX ) 40 MG tablet Take 1 tablet by mouth daily Yes Meliton Sawyer, NP-C   rosuvastatin  (CRESTOR ) 5 MG tablet 1 tablet Orally Once a day for 90 days Yes Emilio Luster HERO, MD   Cephalexin 500 MG TABS TAKE 4 TABS 1 HOUR PRIOR TO ANY DENTAL PROCEDURE Yes [provider]   albuterol  sulfate HFA (PROVENTIL ;VENTOLIN ;PROAIR ) 108 (90 Base) MCG/ACT inhaler TAKE 2 PUFFS BY MOUTH EVERY 4 TO 6 HOURS AS NEEDED Yes [provider]   tiZANidine  (ZANAFLEX ) 2 MG tablet Take 1 tablet by mouth 3 times daily as needed (muscle spasm, sciatica) Yes Fannie Alomar M, MD   doxycycline hyclate (PERIOSTAT) 20 MG tablet Take 1 tablet by mouth 2 times daily Yes [provider]   fluticasone  (FLONASE ) 50 MCG/ACT nasal spray 1 spray by Nasal route every morning Yes Emilio Luster HERO, MD   spironolactone  (ALDACTONE ) 25 MG tablet TAKE 1/2 TABLET BY MOUTH EVERY OTHER DAY Yes Karas, Kalli Hope, PA-C   niacinamide 500 MG tablet Take 1 tablet by mouth 2  times daily (with meals) Yes [provider]   aspirin 81 MG EC tablet Take 1 tablet by mouth every morning Yes [provider]   EPINEPHrine  (EPIPEN ) 0.3 MG/0.3ML SOAJ injection Inject 0.3 mLs into the muscle as needed (ANAPHYLAXIS) Yes [provider]   Cholecalciferol (VITAMIN D) 50 MCG (2000 UT) CAPS capsule Take 1 capsule by mouth daily Yes [provider]     I confirm that I have managed the broad scope of  the patient's health needs by furnishing care for some or all the patient's acute and/or chronic conditions across a spectrum of diagnoses and organ systems that will require ongoing care with myself or someone on my team. Outside of dedicated time spent for SAWV, cardiovascular disease and/or obesity counseling, an additional 35 minutes were spent on this case on the day of the encounter, including preparation to see the patient, review of results, meeting with patient, addressing acute concerns, discussion of care, orders and documentation.  CareTeam (Including outside providers/suppliers regularly involved in providing care):   Patient Care Team:  Emilio Luster HERO, MD as PCP - General  Emilio Luster HERO, MD as PCP - Empaneled Provider  Mallie Repress, MD as Consulting Physician (Internal Medicine)     Recommendations for Preventive Services Due: see orders and patient instructions/AVS.  Recommended screening schedule for the next 5-10 years is provided to the patient in written form: see Patient Instructions/AVS.     Reviewed and updated this visit:  Tobacco  Allergies  Meds  Problems  Med Hx  Surg Hx  Fam Hx       The patient (or guardian, if applicable) and other individuals in attendance with the patient were advised that Artificial Intelligence will be utilized during this visit to record and process the conversation to generate a clinical note. The patient (or guardian, if applicable) and other individuals in attendance at the appointment consented to the use  of AI, including the recording.

## 2023-10-13 NOTE — Assessment & Plan Note (Signed)
 History of AVM with two bleeding episodes requiring transfusion  Followed by Dr. Alecia w/ GI. UTD on colonoscopies

## 2023-10-13 NOTE — Patient Instructions (Signed)
 A Healthy Heart: Care Instructions  Overview    Coronary artery disease, also called heart disease, occurs when a substance called plaque builds up in the vessels that supply oxygen-rich blood to your heart muscle. This can narrow the blood vessels and reduce blood flow. A heart attack happens when blood flow is completely blocked. A high-fat diet, smoking, and other factors increase the risk of heart disease.  Your doctor has found that you have a chance of having heart disease. A heart-healthy lifestyle can help keep your heart healthy and prevent heart disease. This lifestyle includes eating healthy, being active, staying at a weight that's healthy for you, and not smoking, vaping, or using other tobacco or nicotine products. It also includes taking medicines as directed, managing other health conditions, and trying to get a healthy amount of sleep.  Follow-up care is a key part of your treatment and safety. Be sure to make and go to all appointments, and contact your doctor if you are having problems. It's also a good idea to know your test results and keep a list of the medicines you take.  How can you care for yourself at home?  Diet  Use less salt when you cook and eat. This helps lower your blood pressure. Taste food before salting. Add only a little salt when you think you need it. With time, your taste buds will adjust to less salt.  Eat fewer snack items, fast foods, canned soups, and other high-salt, high-fat, processed foods.  Read food labels and try to avoid saturated and trans fats. They increase your risk of heart disease by raising cholesterol levels.  Limit the amount of solid fat--butter, margarine, and shortening--you eat. Use olive, peanut, or canola oil when you cook. Bake, broil, and steam foods instead of frying them.  Eat a variety of fruit and vegetables every day. Dark green, deep orange, red, or yellow fruits and vegetables are especially good for you. Examples include spinach,  carrots, peaches, and berries.  Foods high in fiber can reduce your cholesterol and provide important vitamins and minerals. High-fiber foods include whole-grain cereals and breads, oatmeal, beans, brown rice, citrus fruits, and apples.  Eat lean proteins. Heart-healthy proteins include seafood, lean meats and poultry, eggs, beans, peas, nuts, seeds, and soy products.  Limit drinks and foods with added sugar. These include candy, desserts, and soda pop.  Heart-healthy lifestyle  If your doctor recommends it, get more exercise. For many people, walking is a good choice. Or you may want to swim, bike, or do other activities. Bit by bit, increase the time you're active every day. Try for at least 30 minutes on most days of the week.  If you smoke, vape, or use other tobacco or nicotine products, try to quit. If you can't quit, cut back as much as you can. If you need help quitting, talk to your doctor about quit programs and medicines. Quitting is one of the most important things you can do to protect your heart. Also avoid secondhand smoke and the aerosol mist from vaping.  Stay at a weight that's healthy for you. Talk to your doctor if you need help losing weight.  Try to get 7 to 9 hours of sleep each night.  Limit alcohol to 2 drinks a day for men and 1 drink a day for women. Too much alcohol can cause health problems.  Manage other health problems such as diabetes, high blood pressure, and high cholesterol. If you think you may have  a problem with alcohol or drug use, talk to your doctor.  Medicines  Take your medicines exactly as prescribed. Contact your doctor if you think you are having a problem with your medicine.  When should you call for help?  Call 911 if you have symptoms of a heart attack. These may include:  Chest pain or pressure, or a strange feeling in the chest.  Sweating.  Shortness of breath.  Pain, pressure, or a strange feeling in the back, neck, jaw, or upper belly or in one or both shoulders  or arms.  Lightheadedness or sudden weakness.  A fast or irregular heartbeat.  After you call 911, the operator may tell you to chew 1 adult-strength or 2 to 4 low-dose aspirin. Wait for an ambulance. Do not try to drive yourself.  Watch closely for changes in your health, and be sure to contact your doctor if you have any problems.  Where can you learn more?  Go to [employer] and enter F075 to learn more about A Healthy Heart: Care Instructions.  Current as of: August 28, 2022  Content Version: 14.6   2024-2025 [place], [place].   Care instructions adapted under license by [facility]. If you have questions about a medical condition or this instruction, always ask your healthcare professional. Romayne Alderman, [facility] disclaims any warranty or liability for your use of this information.    Personalized Preventive Plan for Jodi Powell - 10/13/2023  Medicare offers a range of preventive health benefits. Some of the tests and screenings are paid in full while other may be subject to a deductible, co-insurance, and/or copay.  Some of these benefits include a comprehensive review of your medical history including lifestyle, illnesses that may run in your family, and various assessments and screenings as appropriate.  After reviewing your medical record and screening and assessments performed today your provider may have ordered immunizations, labs, imaging, and/or referrals for you.  A list of these orders (if applicable) as well as your Preventive Care list are included within your After Visit Summary for your review.

## 2023-10-13 NOTE — Assessment & Plan Note (Signed)
 Chronic, ongoing  Getting allergy shots with Allergist

## 2023-10-14 NOTE — Progress Notes (Signed)
 Updated, pt has had 2 PNA vaccines.

## 2023-11-14 MED ORDER — SPIRONOLACTONE 25 MG PO TABS
25 | ORAL_TABLET | Freq: Every day | ORAL | 3 refills | 30.00000 days | Status: AC
Start: 2023-11-14 — End: ?

## 2023-11-14 NOTE — Telephone Encounter (Signed)
"  Pt last seen in office 12/30/22  Medication verified in chart  Next apt scheduled 01/01/24    Therisa, RN  "

## 2023-11-30 ENCOUNTER — Encounter

## 2023-12-01 NOTE — Telephone Encounter (Signed)
"  Refused sent by Edie Surescripts   "

## 2023-12-10 ENCOUNTER — Encounter

## 2023-12-10 MED ORDER — METOPROLOL SUCCINATE ER 25 MG PO TB24
25 | ORAL_TABLET | Freq: Every day | ORAL | 1 refills | 90.00000 days | Status: AC
Start: 2023-12-10 — End: ?

## 2023-12-10 NOTE — Telephone Encounter (Signed)
"  LOV:10/13/23  NOV:04/12/24    RX pended ready to send  "

## 2023-12-10 NOTE — Telephone Encounter (Signed)
"  Rx sent  "

## 2023-12-29 ENCOUNTER — Encounter

## 2023-12-29 MED ORDER — PANTOPRAZOLE SODIUM 40 MG PO TBEC
40 | ORAL_TABLET | Freq: Every day | ORAL | 1 refills | 90.00000 days | Status: AC
Start: 2023-12-29 — End: ?

## 2023-12-29 NOTE — Telephone Encounter (Signed)
"  Per protocol criteria met for refill. Refill sent per physician ordered protocol. See signed orders.    "

## 2024-01-01 ENCOUNTER — Ambulatory Visit
Admit: 2024-01-01 | Discharge: 2024-01-01 | Payer: Medicare (Managed Care) | Attending: Internal Medicine | Primary: Family Medicine

## 2024-01-01 VITALS — BP 112/72 | HR 80 | Resp 19 | Ht 63.5 in | Wt 169.0 lb

## 2024-01-01 DIAGNOSIS — I350 Nonrheumatic aortic (valve) stenosis: Principal | ICD-10-CM

## 2024-01-01 NOTE — Patient Instructions (Signed)
"  Echo   Follow up in 1 year   "

## 2024-01-01 NOTE — Progress Notes (Signed)
 "       Date:  January 01, 2024  Patient name: Jodi Powell  Date of Birth: 02-24-1950        REASON FOR VISIT:       SUBJECTIVE HISTORY:         History of Present Illness  The patient is a 73 year old woman who presents for her yearly follow-up for aortic stenosis status post TAVR in 2022.  Since last seen echocardiogram done about a year ago shows excellent function of the TAVR valve.    She reports no hospitalizations or emergency room visits in the past year. She has been adhering to her prescribed medication regimen without experiencing any adverse effects. She is currently on a low dose of spironolactone , taking half a tablet every other day, which aids in managing her ankle swelling. The swelling is not a daily occurrence and appears to be influenced by her dietary intake. She reports no bleeding issues, hematochezia, or melena. She underwent a colonoscopy during which arteriovenous malformations were clipped. She has been advised to maintain a 5-year follow-up schedule for this procedure.        PAST HISTORY          Past Medical History:   Past Medical History:   Diagnosis Date    Acid reflux     Allergies     cats, mold, mildew, dust    Breast cyst     Cystocele with rectocele     History of rosacea     Hx of melanoma of skin     RIGHT ANKLE, LEFT FOOT, LEFT KNEE    Hyperlipidemia     Hypertension     Prediabetes 10/07/2022    Recurrent gastrointestinal hemorrhage     AV malformations    Thyroid nodule 12/20/2019    Right        Past Surgical History:    Past Surgical History:   Procedure Laterality Date    AORTIC VALVE REPLACEMENT  06/2009    AORTIC VALVE REPLACEMENT  02/09/2020    TAVR - DR. ROSS    APPENDECTOMY  1958    BILATERAL OOPHORECTOMY  2010    BREAST BIOPSY Right 2001    Benign / incisional    CARDIAC CATHETERIZATION  01/2020    Dr. Linnie    CARDIAC VALVE REPLACEMENT      COLONOSCOPY  2021    HYSTERECTOMY, VAGINAL  1988    OVARY REMOVAL      PERINEAL SURGERY N/A 09/03/2021    PERINEAL REPAIR  performed by Elna KATHEE Birmingham, MD at [facility] MAIN OR    PTCA      TONSILLECTOMY  1956    UPPER GASTROINTESTINAL ENDOSCOPY  12/2019    Dr. Mallie    VAGINA SURGERY N/A 09/03/2021    VAGINAL REPAIR ANTERIOR AND POSTERIOR VAGINAL SUSPENSION performed by Elna KATHEE Birmingham, MD at [facility] MAIN OR    VAGINA SURGERY N/A 07/01/2022    VAGINAL COLPOCLEISIS performed by Birmingham Elna KATHEE, MD at [facility] MAIN OR    VAGINA SURGERY N/A 07/01/2022    VAGINAL REPAIR ANTERIOR AND POSTERIOR performed by Birmingham Elna KATHEE, MD at [facility] MAIN OR    VAGINA SURGERY N/A 07/01/2022    VAGINAL POSTERIOR REPAIR performed by Birmingham Elna KATHEE, MD at [facility] MAIN OR        Social History:    Social History     Tobacco Use    Smoking status: Former     Current packs/day:  0.00     Average packs/day: 0.5 packs/day for 40.0 years (20.0 ttl pk-yrs)     Types: Cigarettes     Start date: 01/29/1967     Quit date: 1991     Years since quitting: 34.9    Smokeless tobacco: Never    Tobacco comments:     Patient counselled on the dangers of tobacco 12/06/2019, Cessation: Not applicable    Vaping Use    Vaping status: Never Used   Substance Use Topics    Alcohol use: Yes     Alcohol/week: 5.0 standard drinks of alcohol     Types: 5 Glasses of wine per week     Comment: 5 glasses of wine weekly    Drug use: Never        Family History:   Family History   Problem Relation Age of Onset    Elevated Lipids Mother     Coronary Art Dis Mother     Hypertension Mother     Breast Cancer Mother     Hypertension Father     Esophageal Cancer Father     Coronary Art Dis Father     Elevated Lipids Father     Hypertension Sister     Elevated Lipids Sister     Elevated Lipids Brother     Hypertension Brother     Ovarian Cancer Niece         Allergies:    Allergies   Allergen Reactions    Latex Rash    Sulfa Antibiotics Rash and Headaches    Environmental/Seasonal     Adhesive Tape Rash        Home Medications:    Current Outpatient Medications   Medication Sig Dispense Refill    pantoprazole   (PROTONIX ) 40 MG tablet Take 1 tablet by mouth daily 90 tablet 1    metoprolol  succinate (TOPROL  XL) 25 MG extended release tablet Take 1 tablet by mouth daily 90 tablet 1    spironolactone  (ALDACTONE ) 25 MG tablet Take 1 tablet by mouth daily (Patient taking differently: Take 0.5 tablets by mouth every other day) 45 tablet 3    losartan -hydroCHLOROthiazide (HYZAAR) 100-25 MG per tablet Take 1 tablet by mouth daily 90 tablet 1    amLODIPine  (NORVASC ) 5 MG tablet 1 tablet Orally Once a day for 90 days 90 tablet 3    rosuvastatin  (CRESTOR ) 5 MG tablet 1 tablet Orally Once a day for 90 days 90 tablet 1    Cephalexin 500 MG TABS TAKE 4 TABS 1 HOUR PRIOR TO ANY DENTAL PROCEDURE      albuterol  sulfate HFA (PROVENTIL ;VENTOLIN ;PROAIR ) 108 (90 Base) MCG/ACT inhaler TAKE 2 PUFFS BY MOUTH EVERY 4 TO 6 HOURS AS NEEDED      doxycycline hyclate (PERIOSTAT) 20 MG tablet Take 1 tablet by mouth 2 times daily      fluticasone  (FLONASE ) 50 MCG/ACT nasal spray 1 spray by Nasal route every morning 4 each 5    niacinamide 500 MG tablet Take 1 tablet by mouth 2 times daily (with meals)      aspirin 81 MG EC tablet Take 1 tablet by mouth every morning      EPINEPHrine  (EPIPEN ) 0.3 MG/0.3ML SOAJ injection Inject 0.3 mLs into the muscle as needed (ANAPHYLAXIS)       No current facility-administered medications for this visit.        REVIEW OF SYSTEMS      Review of Systems   Constitutional:  Negative for chills, fatigue,  fever and unexpected weight change.   HENT:  Negative for hearing loss, nosebleeds, sore throat, tinnitus and voice change.    Eyes:  Negative for visual disturbance.   Respiratory:  Negative for apnea, cough, chest tightness, shortness of breath and wheezing.    Cardiovascular:  Positive for leg swelling. Negative for chest pain and palpitations.   Gastrointestinal:  Negative for anal bleeding, blood in stool, constipation and diarrhea.   Endocrine: Negative for polydipsia.   Genitourinary:  Negative for difficulty  urinating, dysuria, hematuria and urgency.   Musculoskeletal:  Negative for myalgias.   Skin:  Negative for rash and wound.   Neurological:  Negative for dizziness, syncope, weakness, light-headedness and numbness.   Hematological:  Does not bruise/bleed easily.   Psychiatric/Behavioral:  Negative for sleep disturbance.           PHYSICAL EXAM            BP 112/72 (BP Site: Left Upper Arm, Patient Position: Sitting)   Pulse 80   Resp 19   Ht 1.613 m (5' 3.5)   Wt 76.7 kg (169 lb)   SpO2 96%   BMI 29.47 kg/m      Physical Exam  Constitutional:       General: She is not in acute distress.     Appearance: Normal appearance. She is not ill-appearing or diaphoretic.   HENT:      Head: Normocephalic and atraumatic.      Nose: No congestion.      Mouth/Throat:      Mouth: Mucous membranes are moist.   Neck:      Vascular: No carotid bruit.   Cardiovascular:      Rate and Rhythm: Normal rate and regular rhythm.      Heart sounds: No murmur heard.     No friction rub. No gallop.      Comments: With ectopy  Pulmonary:      Effort: Pulmonary effort is normal. No respiratory distress.      Breath sounds: Normal breath sounds. No stridor. No wheezing, rhonchi or rales.   Chest:      Chest wall: No tenderness.   Abdominal:      General: There is no distension.      Tenderness: There is no abdominal tenderness.   Musculoskeletal:         General: No swelling.      Right lower leg: No edema.      Left lower leg: No edema.   Skin:     General: Skin is warm and dry.   Neurological:      General: No focal deficit present.      Mental Status: She is alert. Mental status is at baseline.   Psychiatric:         Mood and Affect: Mood normal.              DIAGNOSTIC STUDIES      No results found for: BMP, CMP, CBC, NTPROBNP, LIPIDPAN, HBA1C, HFP       No results found for this or any previous visit (from the past 8760 hours).    03/31/23    ECHO (TTE) COMPLETE (PRN CONTRAST/BUBBLE/STRAIN/3D) 04/04/2023  4:32 PM  (Final)    Interpretation Summary    Left Ventricle: Normal left ventricular systolic function with a visually estimated EF of 60 - 65%. Left ventricle size is normal. Normal wall thickness. Normal wall motion. Grade I diastolic dysfunction with normal LAP.    Aortic  Valve: Transcatheter bioprosthetic valve that is well-seated. AV mean gradient is 10 mmHg. No stenosis. LVOT:AV VTI Index is 0.38.    Mitral Valve: Mild regurgitation.    Tricuspid Valve: Moderate regurgitation. The estimated RVSP is 34 mmHg.    Signed by: Jessica Agreste, MD on 04/04/2023  4:32 PM      No results found for this or any previous visit.       12/27/19    CARDIAC PROCEDURE 12/27/2019  2:55 PM (Final)  Signed by: Unknown Provider Result on 12/27/2019  2:55 PM       No results found for this or any previous visit.          ASSESSMENT / RECOMMENDATIONS      IMPRESSION:   Aortic stenosis  --> Status post bioprosthetic aortic valve replacement in 2011 with no recurrent dysfunction, severe AS, moderate to severe AI [place], [place])   --> S/P Valve (TAVR) in valve 02/09/2020 (23mm Edward Sapien valve, Dr. 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

## 2024-01-04 ENCOUNTER — Encounter

## 2024-01-10 ENCOUNTER — Encounter

## 2024-01-11 ENCOUNTER — Encounter

## 2024-01-12 ENCOUNTER — Inpatient Hospital Stay: Admit: 2024-01-12 | Discharge: 2024-01-19 | Payer: MEDICARE | Attending: Internal Medicine | Primary: Family Medicine

## 2024-01-12 VITALS — BP 112/72 | HR 80 | Ht 63.5 in | Wt 169.0 lb

## 2024-01-12 DIAGNOSIS — I350 Nonrheumatic aortic (valve) stenosis: Secondary | ICD-10-CM

## 2024-01-12 MED ORDER — FLUTICASONE PROPIONATE 50 MCG/ACT NA SUSP
50 | Freq: Every morning | NASAL | 0 refills | Status: AC
Start: 2024-01-12 — End: ?

## 2024-01-12 NOTE — Telephone Encounter (Signed)
"  Per protocol criteria for refill reviewed and met; refill sent per physician ordered protocol.      "

## 2024-01-17 ENCOUNTER — Other Ambulatory Visit: Payer: Medicare (Managed Care) | Primary: Family Medicine

## 2024-01-17 LAB — ECHO (TTE) COMPLETE (PRN CONTRAST/BUBBLE/STRAIN/3D)
AV Area by Peak Velocity: 1.3 cm2
AV Area by VTI: 1.4 cm2
AV Mean Gradient: 9 mmHg
AV Mean Velocity: 1.3 m/s
AV Peak Gradient: 17 mmHg
AV Peak Velocity: 2.1 m/s
AV VTI: 44.7 cm
AV Velocity Ratio: 0.43
AVA/BSA Peak Velocity: 0.7 cm2/m2
AVA/BSA VTI: 0.8 cm2/m2
Ao Root Index: 1.66 cm/m2
Aortic Arch: 2.3 cm
Aortic Root: 3 cm
Ascending Aorta Index: 1.55 cm/m2
Ascending Aorta: 2.8 cm
Body Surface Area: 1.85 m2
Descending Aorta Index: 1.05 cm/m2
Descending Aorta: 1.9 cm
E/E' Lateral: 12.5
E/E' Ratio (Averaged): 11.69
E/E' Septal: 10.87
EF BP: 58 % (ref 55–100)
EF Physician: 60 %
Est. RA Pressure: 3 mmHg
Fractional Shortening 2D: 34 % (ref 28–44)
IVC Expiration: 1.5 cm
IVSd: 0.8 cm (ref 0.6–0.9)
LA Area 2C: 17 cm2
LA Area 4C: 16.6 cm2
LA Diameter: 3.3 cm
LA Major Axis: 5.4 cm
LA Minor Axis: 5.3 cm
LA Size Index: 1.82 cm/m2
LA Volume BP: 43 mL (ref 22–52)
LA Volume Index BP: 24 mL/m2 (ref 16–34)
LA Volume Index MOD A2C: 25 mL/m2 (ref 16–34)
LA Volume Index MOD A4C: 23 mL/m2 (ref 16–34)
LA Volume MOD A2C: 45 mL (ref 22–52)
LA Volume MOD A4C: 41 mL (ref 22–52)
LA/AO Root Ratio: 1.1
LV E' Lateral Velocity: 6.48 cm/s
LV E' Septal Velocity: 7.45 cm/s
LV EDV A2C: 58 mL
LV EDV A4C: 55 mL
LV EDV Index A2C: 32 mL/m2
LV EDV Index A4C: 30 mL/m2
LV ESV A2C: 25 mL
LV ESV A4C: 23 mL
LV ESV Index A2C: 14 mL/m2
LV ESV Index A4C: 13 mL/m2
LV Ejection Fraction A2C: 56 %
LV Ejection Fraction A4C: 58 %
LV Mass 2D Index: 60.5 g/m2 (ref 43–95)
LV Mass 2D: 109.4 g (ref 67–162)
LV RWT Ratio: 0.36
LVIDd Index: 2.43 cm/m2
LVIDd: 4.4 cm (ref 3.9–5.3)
LVIDs Index: 1.6 cm/m2
LVIDs: 2.9 cm
LVOT Area: 2.8 cm2
LVOT Diameter: 1.9 cm
LVOT Mean Gradient: 2 mmHg
LVOT Peak Gradient: 4 mmHg
LVOT Peak Velocity: 0.9 m/s
LVOT SV: 64 mL
LVOT Stroke Volume Index: 35.4 mL/m2
LVOT VTI: 22.6 cm
LVOT:AV VTI Index: 0.51
LVPWd: 0.8 cm (ref 0.6–0.9)
MR Peak Gradient: 88 mmHg
MR Peak Velocity: 4.7 m/s
MR VTI: 169 cm
MV A Velocity: 1.2 m/s
MV Area by VTI: 2.1 cm2
MV E Velocity: 0.81 m/s
MV E Wave Deceleration Time: 231 ms
MV E/A: 0.68
MV Max Velocity: 1.2 m/s
MV Mean Gradient: 2 mmHg
MV Mean Velocity: 0.6 m/s
MV Peak Gradient: 6 mmHg
MV VTI: 29.9 cm
MV:LVOT VTI Index: 1.32
RA Area 4C: 22.7 cm2
RV Basal Dimension: 4.3 cm
RV Free Wall Peak S': 8.4 cm/s
RV Longitudinal Dimension: 7.2 cm
RV Mid Dimension: 3.5 cm
RVIDd: 3.6 cm
RVOT Area: 6.2 cm2
RVOT Diameter: 2.8 cm
RVSP: 39 mmHg
TAPSE: 1.7 cm (ref 1.7–?)
TR Max Velocity: 2.98 m/s
TR Peak Gradient: 36 mmHg

## 2024-01-28 ENCOUNTER — Encounter

## 2024-02-05 ENCOUNTER — Ambulatory Visit
Admit: 2024-02-05 | Discharge: 2024-02-05 | Payer: Medicare (Managed Care) | Attending: Medical | Primary: Family Medicine

## 2024-02-05 DIAGNOSIS — Z23 Encounter for immunization: Principal | ICD-10-CM

## 2024-02-05 NOTE — Progress Notes (Signed)
 After obtaining consent, and per orders of Josefa Elsie Domino III, PA-C, injection of Prevnar20 given in left deltoid by Philis Jersey, MA. Patient instructed to remain in clinic for 20 minutes afterwards, and to report any adverse reaction to me immediately. Patient tolerated well, no adverse events. VIS sheet given. Consent form signed and completed.    Immunizations Administered       Name Date Dose Route    Pneumococcal, PCV20, PREVNAR 20, (age 6w+), IM, 0.5mL 02/05/2024 0.5 mL Intramuscular    Site: Deltoid- Left    Lot: [id number]    NDC: 0005-2000-01
